# Patient Record
Sex: Female | Born: 1960
Health system: Southern US, Community
[De-identification: ages and names within clinical notes are randomized; demographics above are authoritative.]

## PROBLEM LIST (undated history)

## (undated) DIAGNOSIS — T7840XA Allergy, unspecified, initial encounter: Secondary | ICD-10-CM

## (undated) DIAGNOSIS — R112 Nausea with vomiting, unspecified: Secondary | ICD-10-CM

## (undated) DIAGNOSIS — I10 Essential (primary) hypertension: Secondary | ICD-10-CM

## (undated) DIAGNOSIS — Z9889 Other specified postprocedural states: Secondary | ICD-10-CM

## (undated) HISTORY — DX: Essential (primary) hypertension: I10

## (undated) HISTORY — DX: Allergy, unspecified, initial encounter: T78.40XA

## (undated) HISTORY — PX: ABDOMINAL HYSTERECTOMY: SHX81

## (undated) HISTORY — PX: OTHER SURGICAL HISTORY: SHX169

---

## 2000-10-11 ENCOUNTER — Ambulatory Visit (HOSPITAL_BASED_OUTPATIENT_CLINIC_OR_DEPARTMENT_OTHER): Admission: RE | Admit: 2000-10-11 | Discharge: 2000-10-11 | Payer: Self-pay | Admitting: *Deleted

## 2002-05-01 ENCOUNTER — Encounter: Payer: Self-pay | Admitting: Family Medicine

## 2002-05-01 ENCOUNTER — Ambulatory Visit (HOSPITAL_COMMUNITY): Admission: RE | Admit: 2002-05-01 | Discharge: 2002-05-01 | Payer: Self-pay | Admitting: Family Medicine

## 2002-06-11 ENCOUNTER — Inpatient Hospital Stay (HOSPITAL_COMMUNITY): Admission: RE | Admit: 2002-06-11 | Discharge: 2002-06-14 | Payer: Self-pay | Admitting: General Surgery

## 2003-08-25 ENCOUNTER — Ambulatory Visit (HOSPITAL_COMMUNITY): Admission: RE | Admit: 2003-08-25 | Discharge: 2003-08-25 | Payer: Self-pay | Admitting: Family Medicine

## 2004-11-02 ENCOUNTER — Ambulatory Visit: Payer: Self-pay | Admitting: Family Medicine

## 2004-11-03 ENCOUNTER — Ambulatory Visit (HOSPITAL_COMMUNITY): Admission: RE | Admit: 2004-11-03 | Discharge: 2004-11-03 | Payer: Self-pay | Admitting: Family Medicine

## 2005-05-11 ENCOUNTER — Ambulatory Visit: Payer: Self-pay | Admitting: Family Medicine

## 2005-12-09 ENCOUNTER — Ambulatory Visit: Payer: Self-pay | Admitting: Internal Medicine

## 2006-07-24 ENCOUNTER — Ambulatory Visit: Payer: Self-pay | Admitting: Family Medicine

## 2006-09-04 ENCOUNTER — Ambulatory Visit: Payer: Self-pay | Admitting: Family Medicine

## 2006-10-27 ENCOUNTER — Other Ambulatory Visit: Admission: RE | Admit: 2006-10-27 | Discharge: 2006-10-27 | Payer: Self-pay | Admitting: Family Medicine

## 2006-10-27 ENCOUNTER — Encounter: Payer: Self-pay | Admitting: Family Medicine

## 2006-10-27 ENCOUNTER — Ambulatory Visit: Payer: Self-pay | Admitting: Family Medicine

## 2007-07-02 ENCOUNTER — Encounter: Payer: Self-pay | Admitting: Family Medicine

## 2007-07-02 DIAGNOSIS — E669 Obesity, unspecified: Secondary | ICD-10-CM

## 2007-07-02 DIAGNOSIS — I10 Essential (primary) hypertension: Secondary | ICD-10-CM

## 2007-07-12 ENCOUNTER — Ambulatory Visit: Payer: Self-pay | Admitting: Family Medicine

## 2007-08-04 ENCOUNTER — Encounter: Payer: Self-pay | Admitting: Family Medicine

## 2007-08-04 LAB — CONVERTED CEMR LAB: Glucose, 2 hour: 224 mg/dL — ABNORMAL HIGH (ref 70–139)

## 2007-08-08 ENCOUNTER — Ambulatory Visit (HOSPITAL_COMMUNITY): Admission: RE | Admit: 2007-08-08 | Discharge: 2007-08-08 | Payer: Self-pay | Admitting: Family Medicine

## 2007-08-09 ENCOUNTER — Ambulatory Visit: Payer: Self-pay | Admitting: Family Medicine

## 2007-08-30 ENCOUNTER — Ambulatory Visit: Payer: Self-pay | Admitting: Family Medicine

## 2007-09-01 ENCOUNTER — Encounter: Payer: Self-pay | Admitting: Family Medicine

## 2007-09-01 LAB — CONVERTED CEMR LAB: Microalb, Ur: 0.2 mg/dL (ref 0.00–1.89)

## 2007-09-07 DIAGNOSIS — E1169 Type 2 diabetes mellitus with other specified complication: Secondary | ICD-10-CM | POA: Insufficient documentation

## 2007-09-07 DIAGNOSIS — E1159 Type 2 diabetes mellitus with other circulatory complications: Secondary | ICD-10-CM | POA: Insufficient documentation

## 2008-10-14 ENCOUNTER — Telehealth: Payer: Self-pay | Admitting: Family Medicine

## 2008-10-28 ENCOUNTER — Encounter: Payer: Self-pay | Admitting: Family Medicine

## 2008-10-28 ENCOUNTER — Other Ambulatory Visit: Admission: RE | Admit: 2008-10-28 | Discharge: 2008-10-28 | Payer: Self-pay | Admitting: Family Medicine

## 2008-10-28 ENCOUNTER — Ambulatory Visit: Payer: Self-pay | Admitting: Family Medicine

## 2008-10-28 DIAGNOSIS — R5381 Other malaise: Secondary | ICD-10-CM

## 2008-10-28 DIAGNOSIS — N760 Acute vaginitis: Secondary | ICD-10-CM | POA: Insufficient documentation

## 2008-10-28 DIAGNOSIS — H409 Unspecified glaucoma: Secondary | ICD-10-CM

## 2008-10-28 DIAGNOSIS — R5383 Other fatigue: Secondary | ICD-10-CM

## 2008-10-28 LAB — CONVERTED CEMR LAB: OCCULT 1: NEGATIVE

## 2008-10-29 ENCOUNTER — Encounter: Payer: Self-pay | Admitting: Family Medicine

## 2009-02-24 ENCOUNTER — Ambulatory Visit: Payer: Self-pay | Admitting: Family Medicine

## 2009-02-25 ENCOUNTER — Telehealth: Payer: Self-pay | Admitting: Family Medicine

## 2009-03-14 DIAGNOSIS — J309 Allergic rhinitis, unspecified: Secondary | ICD-10-CM

## 2009-04-13 ENCOUNTER — Encounter: Payer: Self-pay | Admitting: Family Medicine

## 2009-08-10 ENCOUNTER — Encounter: Payer: Self-pay | Admitting: Physician Assistant

## 2009-08-10 ENCOUNTER — Ambulatory Visit: Payer: Self-pay | Admitting: Family Medicine

## 2009-08-10 DIAGNOSIS — K5289 Other specified noninfective gastroenteritis and colitis: Secondary | ICD-10-CM

## 2009-08-11 ENCOUNTER — Encounter: Payer: Self-pay | Admitting: Physician Assistant

## 2009-08-11 ENCOUNTER — Telehealth: Payer: Self-pay | Admitting: Physician Assistant

## 2009-08-18 ENCOUNTER — Telehealth: Payer: Self-pay | Admitting: Physician Assistant

## 2009-08-19 ENCOUNTER — Ambulatory Visit: Payer: Self-pay | Admitting: Family Medicine

## 2009-08-19 ENCOUNTER — Encounter: Payer: Self-pay | Admitting: Physician Assistant

## 2009-09-28 ENCOUNTER — Telehealth: Payer: Self-pay | Admitting: Family Medicine

## 2009-11-05 ENCOUNTER — Encounter: Payer: Self-pay | Admitting: Family Medicine

## 2010-02-10 ENCOUNTER — Ambulatory Visit: Payer: Self-pay | Admitting: Family Medicine

## 2010-02-10 DIAGNOSIS — J209 Acute bronchitis, unspecified: Secondary | ICD-10-CM

## 2010-02-10 DIAGNOSIS — J019 Acute sinusitis, unspecified: Secondary | ICD-10-CM | POA: Insufficient documentation

## 2010-02-11 LAB — CONVERTED CEMR LAB
BUN: 8 mg/dL (ref 6–23)
Chloride: 102 meq/L (ref 96–112)
Glucose, Bld: 90 mg/dL (ref 70–99)
Potassium: 3.9 meq/L (ref 3.5–5.3)

## 2010-02-15 ENCOUNTER — Encounter (INDEPENDENT_AMBULATORY_CARE_PROVIDER_SITE_OTHER): Payer: Self-pay

## 2010-03-05 ENCOUNTER — Ambulatory Visit (HOSPITAL_COMMUNITY): Admission: RE | Admit: 2010-03-05 | Discharge: 2010-03-05 | Payer: Self-pay | Admitting: Family Medicine

## 2010-03-06 ENCOUNTER — Emergency Department (HOSPITAL_COMMUNITY): Admission: EM | Admit: 2010-03-06 | Discharge: 2010-03-06 | Payer: Self-pay | Admitting: Emergency Medicine

## 2010-03-06 ENCOUNTER — Encounter: Payer: Self-pay | Admitting: Orthopedic Surgery

## 2010-03-08 ENCOUNTER — Encounter: Payer: Self-pay | Admitting: Physician Assistant

## 2010-03-08 ENCOUNTER — Ambulatory Visit: Payer: Self-pay | Admitting: Family Medicine

## 2010-03-08 DIAGNOSIS — IMO0002 Reserved for concepts with insufficient information to code with codable children: Secondary | ICD-10-CM

## 2010-03-09 ENCOUNTER — Telehealth: Payer: Self-pay | Admitting: Physician Assistant

## 2010-03-15 ENCOUNTER — Encounter: Payer: Self-pay | Admitting: Physician Assistant

## 2010-03-15 ENCOUNTER — Ambulatory Visit: Payer: Self-pay | Admitting: Family Medicine

## 2010-03-22 ENCOUNTER — Ambulatory Visit: Payer: Self-pay | Admitting: Orthopedic Surgery

## 2010-03-22 DIAGNOSIS — M542 Cervicalgia: Secondary | ICD-10-CM

## 2010-03-22 DIAGNOSIS — M549 Dorsalgia, unspecified: Secondary | ICD-10-CM | POA: Insufficient documentation

## 2010-03-29 ENCOUNTER — Encounter: Payer: Self-pay | Admitting: Orthopedic Surgery

## 2010-03-30 ENCOUNTER — Encounter (HOSPITAL_COMMUNITY): Admission: RE | Admit: 2010-03-30 | Discharge: 2010-04-29 | Payer: Self-pay | Admitting: Orthopedic Surgery

## 2010-04-02 ENCOUNTER — Encounter: Payer: Self-pay | Admitting: Orthopedic Surgery

## 2010-04-09 ENCOUNTER — Encounter: Payer: Self-pay | Admitting: Orthopedic Surgery

## 2010-04-13 ENCOUNTER — Encounter: Payer: Self-pay | Admitting: Orthopedic Surgery

## 2010-04-14 ENCOUNTER — Encounter: Payer: Self-pay | Admitting: Orthopedic Surgery

## 2010-05-03 ENCOUNTER — Encounter (HOSPITAL_COMMUNITY)
Admission: RE | Admit: 2010-05-03 | Discharge: 2010-06-02 | Payer: Self-pay | Source: Home / Self Care | Attending: Orthopedic Surgery | Admitting: Orthopedic Surgery

## 2010-05-05 ENCOUNTER — Encounter: Payer: Self-pay | Admitting: Orthopedic Surgery

## 2010-05-20 ENCOUNTER — Encounter: Payer: Self-pay | Admitting: Family Medicine

## 2010-06-02 ENCOUNTER — Ambulatory Visit: Payer: Self-pay | Admitting: Family Medicine

## 2010-06-26 ENCOUNTER — Encounter: Payer: Self-pay | Admitting: Family Medicine

## 2010-06-27 ENCOUNTER — Encounter: Payer: Self-pay | Admitting: Family Medicine

## 2010-06-28 ENCOUNTER — Encounter: Payer: Self-pay | Admitting: Family Medicine

## 2010-07-04 LAB — CONVERTED CEMR LAB: Candida species: NEGATIVE
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
## 2010-07-06 NOTE — Letter (Signed)
Summary: Historyform  Historyform   Imported By: Jacklynn Ganong 03/29/2010 13:08:04  _____________________________________________________________________  External Attachment:    Type:   Image     Comment:   External Document

## 2010-07-06 NOTE — Letter (Signed)
Summary: MED REVIEW SHEET  MED REVIEW SHEET   Imported By: Lind Guest 03/15/2010 15:38:15  _____________________________________________________________________  External Attachment:    Type:   Image     Comment:   External Document

## 2010-07-06 NOTE — Letter (Signed)
Summary: Out of Work  [facility]  [address]   [place], [place] [address]   Phone: [phone]  Fax: [phone]    August 19, 2009   Employee:  ILIZA BLANKENBECKLER    To Whom It May Concern:   For Medical reasons, please excuse the above named employee from work for the following dates:  Start:   08/19/09  End:     08/20/09 May return to work without restriction.    If you need additional information, please feel free to contact our office.         Sincerely,    Esperanza Sheets PA
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
## 2010-07-06 NOTE — Progress Notes (Signed)
Summary: FMLA PAPERS  Phone Note Call from Patient   Summary of Call: CAME BY TO SEE IF THE  FMLA PAPERS WERE READY AND WILL Braya Habermehl FILL THEM OUT  OR WHAT BUT SHE NEEDS THEM BY FRIDAY WILL COME BACK BY TOMORROW AND SEE IF THEY ARE READY  Initial call taken by: Lind Guest,  August 18, 2009 4:00 PM  Follow-up for Phone Call        Appt Scheduled Today Follow-up by: Esperanza Sheets PA,  August 19, 2009 9:59 AM

## 2010-07-06 NOTE — Letter (Signed)
Summary: fmla papers  fmla papers   Imported By: Lind Guest 08/20/2009 07:59:09  _____________________________________________________________________  External Attachment:    Type:   Image     Comment:   External Document

## 2010-07-06 NOTE — Letter (Signed)
Summary: health insurance claim  health insurance claim   Imported By: Lind Guest 08/20/2009 07:58:45  _____________________________________________________________________  External Attachment:    Type:   Image     Comment:   External Document

## 2010-07-06 NOTE — Miscellaneous (Signed)
Summary: PT Clinical evaluation  PT Clinical evaluation   Imported By: Jacklynn Ganong 04/13/2010 15:34:38  _____________________________________________________________________  External Attachment:    Type:   Image     Comment:   External Document

## 2010-07-06 NOTE — Progress Notes (Signed)
Summary: MEDICINE  Phone Note Call from Patient   Summary of Call: NEEDS HER DIABETIC MEDICINE REFILLED SEND TO [facility] [place] Initial call taken by: Lind Guest,  September 28, 2009 9:08 AM  Follow-up for Phone Call        Rx Called In Follow-up by: Adella Hare LPN,  September 28, 2009 12:10 PM    Prescriptions: METFORMIN HCL 1000 MG TABS (METFORMIN HCL) Take 1 tablet by mouth two times a day  #180 x 1   Entered by:   Adella Hare LPN   Authorized by:   Syliva Overman MD   Signed by:   Adella Hare LPN on 04/54/0981   Method used:   Electronically to        [facility]  [address] (retail)       [address]       [place], [place]  [address]       Ph: [phone]       Fax: [phone]   RxID:   [id number]

## 2010-07-06 NOTE — Letter (Signed)
Summary: Out of Work  [facility]  [address]  [place], [place] [address]   Phone: [phone]  Fax: [phone]      April 14, 2010   Employee:  Daisy Robbins    To Whom It May Concern:  Patient was seen in this office on 03/22/10.  For Medical reasons, please excuse the above named employee from work for the following dates:  Start:   03/22/10  End/Return to work, full duty, no restrictions:     04/20/10   If you need additional information, please feel free to contact our office.         Sincerely,    Terrance Mass, MD

## 2010-07-06 NOTE — Progress Notes (Signed)
Summary: EYE EXAM  EYE EXAM   Imported By: Lind Guest 11/20/2009 08:34:58  _____________________________________________________________________  External Attachment:    Type:   Image     Comment:   External Document

## 2010-07-06 NOTE — Assessment & Plan Note (Signed)
Summary: sick- room 3   Vital Signs:  Patient profile:   50 year old female Menstrual status:  hysterectomy Height:      63 inches Weight:      184.25 pounds BMI:     32.76 O2 Sat:      99 % on Room air Pulse rate:   93 / minute Resp:     16 per minute BP sitting:   126 / 70  (left arm)  Vitals Entered By: Adella Hare LPN (August 19, 2009 1:11 PM)  Nutrition Counseling: Patient's BMI is greater than 25 and therefore counseled on weight management options. CC: nausea, vomitting, diarrhea x 1 day Is Patient Diabetic? Yes Pain Assessment Patient in pain? no        CC:  nausea, vomitting, and diarrhea x 1 day.  History of Present Illness: Nausea started last night.  Awoke 2 am today wiht diarrhea & vomiting.  No vomiting since early this am, but still having diarrhea.  No blood. + chills, but no fever.  Pt states this is going thru her family.  It started with her grandson, then her son.  Now her 2 daughters are starting to get nauseated.  She was seen 08-10-09 with similar syptoms but had completely resolved & had returned to work this wk.  Unable to work today.  Pt is diabetic.  Not checking her blood sugars.  Is overdue for labs.  Has an appt in April with Dr Lodema Hong for follow up.  Pt c/o vaginal itching the last couple of day.  Also has small amt of white vag disch.  Feels like she has a yeast infection again.  Requesting prescription.   Current Medications (verified): 1)  Benazepril-Hydrochlorothiazide 20-12.5 Mg  Tabs (Benazepril-Hydrochlorothiazide) .... Take One Tablet By Mouth Once A Day 2)  Allegra 180 Mg  Tabs (Fexofenadine Hcl) .... Take One Tablet By Mouth Once A Day 3)  Metformin Hcl 1000 Mg Tabs (Metformin Hcl) .... Take 1 Tablet By Mouth Two Times A Day 4)  Tessalon Perles 100 Mg Caps (Benzonatate) .... Take 1 Capsule By Mouth Three Times A Day As Needed 5)  Promethazine Hcl 25 Mg Tabs (Promethazine Hcl) .Marland Kitchen.. 1 Q 6-8 H As Needed Nausea 6)  Loperamide Hcl 2 Mg  Caps (Loperamide Hcl) .... Take 2 Caps Now, Then 1 After Each Loose Bm.  Max 8 Caps/24 Hrs.  Allergies (verified): No Known Drug Allergies  Past History:  Past medical history reviewed for relevance to current acute and chronic problems.  Past Medical History: Reviewed history from 10/28/2008 and no changes required. DIABETES MELLITUS, TYPE II, WITHOUT COMPLICATIONS (ICD-250.00) ACUTE BRONCHITIS (ICD-466.0) OBESITY, UNSPECIFIED (ICD-278.00) ESSENTIAL HYPERTENSION, BENIGN (ICD-401.1) CONJUNCTIVITIS, RIGHT (ICD-372.30) SINUSITIS, ACUTE (ICD-461.9) Glaucoma dx in 2008  Review of Systems General:  Complains of chills; denies fever. ENT:  Denies earache, nasal congestion, sinus pressure, and sore throat. CV:  Denies chest pain or discomfort. Resp:  Denies cough and shortness of breath. GI:  Complains of diarrhea, loss of appetite, nausea, and vomiting; denies abdominal pain, bloody stools, constipation, and vomiting blood. MS:  Denies muscle aches.  Physical Exam  General:  Well-developed,well-nourished,in no acute distress; alert,appropriate and cooperative throughout examination Head:  Normocephalic and atraumatic without obvious abnormalities. No apparent alopecia or balding. Ears:  External ear exam shows no significant lesions or deformities.  Otoscopic examination reveals clear canals, tympanic membranes are intact bilaterally without bulging, retraction, inflammation or discharge. Hearing is grossly normal bilaterally. Nose:  External nasal examination  shows no deformity or inflammation. Nasal mucosa are pink and moist without lesions or exudates. Mouth:  Oral mucosa and oropharynx without lesions or exudates.  Neck:  No deformities, masses, or tenderness noted. Lungs:  Normal respiratory effort, chest expands symmetrically. Lungs are clear to auscultation, no crackles or wheezes. Heart:  Normal rate and regular rhythm. S1 and S2 normal without gallop, murmur, click, rub or  other extra sounds. Abdomen:  Bowel sounds positive,abdomen soft and non-tender without masses, organomegaly or hernias noted. Skin:  turgor normal.   Cervical Nodes:  No lymphadenopathy noted Psych:  Cognition and judgment appear intact. Alert and cooperative with normal attention span and concentration. No apparent delusions, illusions, hallucinations   Impression & Recommendations:  Problem # 1:  GASTROENTERITIS (ICD-558.9) Pt states she will use OTC Imodium as needed.  Did not get prev prescription for Loperamide. Discussed clear liquids, & BRAT diet as tolerated. Off work today.  Pt wants to return tomorrow if able. Her updated medication list for this problem includes:    Loperamide Hcl 2 Mg Caps (Loperamide hcl) .Marland Kitchen... Take 2 caps now, then 1 after each loose bm.  max 8 caps/24 hrs.  Orders: Zofran 1mg . injection (Z6109)  Problem # 2:  ESSENTIAL HYPERTENSION, BENIGN (ICD-401.1) Assessment: Improved  Her updated medication list for this problem includes:    Benazepril-hydrochlorothiazide 20-12.5 Mg Tabs (Benazepril-hydrochlorothiazide) .Marland Kitchen... Take one tablet by mouth once a day  BP today: 126/70 Prior BP: 140/80 (08/10/2009)  Problem # 3:  DIABETES MELLITUS, TYPE II, WITHOUT COMPLICATIONS (ICD-250.00) Encouraged pt to check her blood sugars.  Her updated medication list for this problem includes:    Benazepril-hydrochlorothiazide 20-12.5 Mg Tabs (Benazepril-hydrochlorothiazide) .Marland Kitchen... Take one tablet by mouth once a day    Metformin Hcl 1000 Mg Tabs (Metformin hcl) .Marland Kitchen... Take 1 tablet by mouth two times a day  Orders: T-Basic Metabolic Panel [phone]) T-Lipid Profile [phone]) T- Hemoglobin A1C ([id number]) T-Urine Microalbumin w/creat. ratio [id number])  Problem # 4:  VAGINITIS (ICD-616.10) Assessment: New Rxd Diflucan. If syptoms don't improve will need exam & cultures.  Complete Medication List: 1)  Benazepril-hydrochlorothiazide 20-12.5 Mg Tabs  (Benazepril-hydrochlorothiazide) .... Take one tablet by mouth once a day 2)  Allegra 180 Mg Tabs (Fexofenadine hcl) .... Take one tablet by mouth once a day 3)  Metformin Hcl 1000 Mg Tabs (Metformin hcl) .... Take 1 tablet by mouth two times a day 4)  Tessalon Perles 100 Mg Caps (Benzonatate) .... Take 1 capsule by mouth three times a day as needed 5)  Promethazine Hcl 25 Mg Tabs (Promethazine hcl) .Marland Kitchen.. 1 q 6-8 h as needed nausea 6)  Loperamide Hcl 2 Mg Caps (Loperamide hcl) .... Take 2 caps now, then 1 after each loose bm.  max 8 caps/24 hrs. 7)  Fluconazole 150 Mg Tabs (Fluconazole) .... Take 1 by mouth for yeast infection  Patient Instructions: 1)  Keep your appt next month. 2)  Have lab work done fasting before your next appt. 3)  It is important that you exercise regularly at least 20 minutes 5 times a week. If you develop chest pain, have severe difficulty breathing, or feel very tired , stop exercising immediately and seek medical attention. 4)  You need to lose weight. Consider a lower calorie diet and regular exercise.  5)  Check your blood sugars regularly.  6)  The main problem with gastroenteritis is dehydration. Drink plenty of fluids and take solids as you feel better. If you are unable to keep anything down  and/or you show signs of dehydration(dry/cracked lips, lack of tears, not urinating, very sleepy), call our office.   7)  You may use over the counter Imodium as needed for diarrhea. 8)  You received an injection of Zofran today to help with nausea. 9)  . Prescriptions: FLUCONAZOLE 150 MG TABS (FLUCONAZOLE) take 1 by mouth for yeast infection  #1 x 0   Entered and Authorized by:   Esperanza Sheets PA   Signed by:   Esperanza Sheets PA on 08/19/2009   Method used:   Electronically to        [facility] (retail)       [address]       [place], [place]  [address]       Ph: [phone]       Fax: [phone]   RxID:    [id number]   Appended Document: sick- room 3   Medication Administration  Injection # 1:    Medication: Zofran 1mg . injection    Diagnosis: GASTROENTERITIS (ICD-558.9)    Route: IM    Site: RUOQ gluteus    Exp Date: 2/12    Lot #: [id number]    Mfr: [email or web address]    Comments: zofran 4mg     Patient tolerated injection without complications    Given by: Adella Hare LPN (August 19, 2009 4:06 PM)  Orders Added: 1)  Admin of Therapeutic Inj  intramuscular or subcutaneous [40102]

## 2010-07-06 NOTE — Letter (Signed)
Summary: *Orthopedic Consult Note  [facility]  [address]  [place], [place] [address]   Phone: [phone]  Fax: [phone]    Re:    Daisy Robbins DOB:    [birth year]   Dear: Alvis Lemmings   Thank you for requesting that we see the above patient for consultation.  A copy of the detailed office note will be sent under separate cover, for your review.  Evaluation today is consistent with:  1)  BACK PAIN (ICD-724.5) 2)  NECK PAIN, ACUTE (ICD-723.1)   Other than some mild DJD of the L spine there are no acute findings. There are no surgical lesions. She has not had physical therapy.   I recommend she have PT for 6 weeks and continue the medications you have her on. I have advised her to follw with you. If her condiditions worsen then we would be happy to see her again. She is scheduled to return to work On Nov 15th    Thank you for this opportunity to look after your patient.  Sincerely,   Terrance Mass. MD.

## 2010-07-06 NOTE — Letter (Signed)
Summary: Unable to Reach, Consult Scheduled  [facility]  [address]   [place], [place] [address]   Phone: [phone]  Fax: [phone]    02/15/2010  Daisy Robbins [address] [place], [place]  [address] [birth year]   Dear Ms. Lobban,   We have been unable to reach you by phone. You have been referred by Dr. Lodema Hong to have a colonoscopy. Please call our office and ask to speak to the triage nurse to get the appointment scheduled. If you have any questions we will be glad to try to answer them for you.     Thank you,    Cloria Spring LPN  [facility] R. Roetta Sessions, M.D.    Jonette Eva, M.D. Lorenza Burton, FNP-BC    Tana Coast, PA-C Phone: [phone]    Fax: [phone]

## 2010-07-06 NOTE — Letter (Signed)
Summary: Out of Work  [facility]  [address]   [place], [place] [address]   Phone: [phone]  Fax: [phone]    August 11, 2009   Employee:  Daisy Robbins    To Whom It May Concern:   For Medical reasons, please excuse the above named employee from work for the following dates:  Start:   08/10/09  End:   08/17/09 may return to work without restriction  If you need additional information, please feel free to contact our office.         Sincerely,    Esperanza Sheets PA

## 2010-07-06 NOTE — Letter (Signed)
Summary: Work note fax to employer  Work note fax to employer   Imported By: Cammie Sickle 04/20/2010 15:40:02  _____________________________________________________________________  External Attachment:    Type:   Image     Comment:   External Document

## 2010-07-06 NOTE — Letter (Signed)
Summary: [employer]  [employer]   Imported By: Cammie Sickle 04/05/2010 18:21:47  _____________________________________________________________________  External Attachment:    Type:   Image     Comment:   External Document
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
## 2010-07-06 NOTE — Progress Notes (Signed)
Summary: CALL HER BACK  Phone Note Call from Patient   Summary of Call: NEEDSS YOU TO CALL HER ABOUT A CHRIO. OR SOMETHING SHE LEFT MESSAGE Initial call taken by: Lind Guest,  March 09, 2010 4:31 PM  Follow-up for Phone Call        pt don't have appt with harrisons office until 03/22/2010 3:00. pt says there is no way she can go back to work on 03/15/2010. she has to push and pull and back and neck already hurt. would need a note to cover her until she sees dr. Diamantina Providence. She has paper work that job requires. please let her know what office can do. [phone] Follow-up by: Rudene Anda,  March 09, 2010 4:53 PM  Additional Follow-up for Phone Call Additional follow up Details #1::        Do you want to extend her work note or see her back first Additional Follow-up by: Everitt Amber LPN,  March 10, 2010 9:02 AM    Additional Follow-up for Phone Call Additional follow up Details #2::    We can extend it. Appt here on 03-15-10.   Follow-up by: Esperanza Sheets PA,  March 10, 2010 9:12 AM  Additional Follow-up for Phone Call Additional follow up Details #3:: Details for Additional Follow-up Action Taken: patient aware. Luann to schedule with dawn on 10/10 Additional Follow-up by: Everitt Amber LPN,  March 10, 2010 4:51 PM

## 2010-07-06 NOTE — Assessment & Plan Note (Signed)
Summary: sick - room 2   Vital Signs:  Patient profile:   50 year old female Menstrual status:  hysterectomy Height:      63 inches Weight:      183.75 pounds BMI:     32.67 O2 Sat:      98 % on Room air Pulse rate:   93 / minute Resp:     16 per minute BP sitting:   140 / 80  (left arm)  Vitals Entered By: Adella Hare LPN (August 10, 1608 3:49 PM) CC: nausea, diarrhea, chills, fever, body aches, fatigue x 2 days Is Patient Diabetic? Yes Did you bring your meter with you today? No Pain Assessment Patient in pain? no        CC:  nausea, diarrhea, chills, fever, body aches, and fatigue x 2 days.  History of Present Illness: Pt c/o nausea & diarrhea.  Started yesterday.  Watery diarrhea, no blood.  No vomiting.  Her appetite is decreased.  Drinking fluids ok.  Has tried to eat some food today, but had to throw it away due to nausea.  Tried to work today but feels exhausted.  No fever.  She is taking her medications.  Hx of DM & HTN.    Current Medications (verified): 1)  Benazepril-Hydrochlorothiazide 20-12.5 Mg  Tabs (Benazepril-Hydrochlorothiazide) .... Take One Tablet By Mouth Once A Day 2)  Allegra 180 Mg  Tabs (Fexofenadine Hcl) .... Take One Tablet By Mouth Once A Day 3)  Metformin Hcl 1000 Mg Tabs (Metformin Hcl) .... Take 1 Tablet By Mouth Two Times A Day 4)  Tessalon Perles 100 Mg Caps (Benzonatate) .... Take 1 Capsule By Mouth Three Times A Day As Needed  Allergies (verified): No Known Drug Allergies  Past History:  Past medical history reviewed for relevance to current acute and chronic problems.  Past Medical History: Reviewed history from 10/28/2008 and no changes required. DIABETES MELLITUS, TYPE II, WITHOUT COMPLICATIONS (ICD-250.00) ACUTE BRONCHITIS (ICD-466.0) OBESITY, UNSPECIFIED (ICD-278.00) ESSENTIAL HYPERTENSION, BENIGN (ICD-401.1) CONJUNCTIVITIS, RIGHT (ICD-372.30) SINUSITIS, ACUTE (ICD-461.9) Glaucoma dx in 2008  Review of  Systems General:  Denies chills and fever. ENT:  Denies earache, nasal congestion, and sore throat. CV:  Denies chest pain or discomfort and palpitations. Resp:  Denies cough and shortness of breath. GI:  Complains of diarrhea and nausea; denies abdominal pain, bloody stools, dark tarry stools, indigestion, and vomiting. GU:  Denies dysuria. MS:  Denies muscle aches. Heme:  Denies enlarge lymph nodes.  Physical Exam  General:  Well-developed,well-nourished,in no acute distress; alert,appropriate and cooperative throughout examination Head:  Normocephalic and atraumatic without obvious abnormalities. No apparent alopecia or balding. Ears:  External ear exam shows no significant lesions or deformities.  Otoscopic examination reveals clear canals, tympanic membranes are intact bilaterally without bulging, retraction, inflammation or discharge. Hearing is grossly normal bilaterally. Nose:  External nasal examination shows no deformity or inflammation. Nasal mucosa are pink and moist without lesions or exudates. Mouth:  Oral mucosa and oropharynx without lesions or exudates.  Teeth in good repair. Mouth moist. Neck:  No deformities, masses, or tenderness noted. Lungs:  Normal respiratory effort, chest expands symmetrically. Lungs are clear to auscultation, no crackles or wheezes. Heart:  Normal rate and regular rhythm. S1 and S2 normal without gallop, murmur, click, rub or other extra sounds. Abdomen:  Nl BX x 4.  Soft.  No mass.  Mild diffuse TTP.  No guarding, rebound or rigidity Skin:  Warm & dry. Cervical Nodes:  No lymphadenopathy  noted Psych:  Cognition and judgment appear intact. Alert and cooperative with normal attention span and concentration. No apparent delusions, illusions, hallucinations   Impression & Recommendations:  Problem # 1:  GASTROENTERITIS (ICD-558.9)  Pt declined shot in office for nausea.  Requests prescription pills.  Her updated medication list for this problem  includes:    Loperamide Hcl 2 Mg Caps (Loperamide hcl) .Marland Kitchen... Take 2 caps now, then 1 after each loose bm.  max 8 caps/24 hrs.  Discussed use of medication and role of diet. Encouraged clear liquids and electrolyte replacement fluids. Instructed to call if any signs of worsening dehydration.   Problem # 2:  ESSENTIAL HYPERTENSION, BENIGN (ICD-401.1) Assessment: Comment Only  Her updated medication list for this problem includes:    Benazepril-hydrochlorothiazide 20-12.5 Mg Tabs (Benazepril-hydrochlorothiazide) .Marland Kitchen... Take one tablet by mouth once a day  BP today: 140/80 Prior BP: 120/80 (02/24/2009)  Problem # 3:  DIABETES MELLITUS, TYPE II, WITHOUT COMPLICATIONS (ICD-250.00) Assessment: Comment Only  Her updated medication list for this problem includes:    Benazepril-hydrochlorothiazide 20-12.5 Mg Tabs (Benazepril-hydrochlorothiazide) .Marland Kitchen... Take one tablet by mouth once a day    Metformin Hcl 1000 Mg Tabs (Metformin hcl) .Marland Kitchen... Take 1 tablet by mouth two times a day  Reviewed HgBA1c results: 6.8 (03/16/2009)  Complete Medication List: 1)  Benazepril-hydrochlorothiazide 20-12.5 Mg Tabs (Benazepril-hydrochlorothiazide) .... Take one tablet by mouth once a day 2)  Allegra 180 Mg Tabs (Fexofenadine hcl) .... Take one tablet by mouth once a day 3)  Metformin Hcl 1000 Mg Tabs (Metformin hcl) .... Take 1 tablet by mouth two times a day 4)  Tessalon Perles 100 Mg Caps (Benzonatate) .... Take 1 capsule by mouth three times a day as needed 5)  Promethazine Hcl 25 Mg Tabs (Promethazine hcl) .Marland Kitchen.. 1 q 6-8 h as needed nausea 6)  Loperamide Hcl 2 Mg Caps (Loperamide hcl) .... Take 2 caps now, then 1 after each loose bm.  max 8 caps/24 hrs.  Patient Instructions: 1)  Please schedule a follow-up appointment as needed. 2)  Take 650-1000mg  of Tylenol every 4-6 hours as needed for relief of pain or comfort of fever AVOID taking more than 4000mg   in a 24 hour period (can cause liver damage in higher  doses). 3)  Oral Rehydration Solution: drink 1/2 ounce every 15 minutes. If tolerated afert 1 hour, drink 1 ounce every 15 minutes. As you can tolerate, keep adding 1/2 ounce every 15 minutes, up to a total of 2-4 ounces. Contact the office if unable to tolerate oral solution, if you keep vomiting, or you continue to have signs of dehydration. Prescriptions: LOPERAMIDE HCL 2 MG CAPS (LOPERAMIDE HCL) take 2 caps now, then 1 after each loose BM.  Max 8 caps/24 hrs.  #24 x 0   Entered and Authorized by:   Esperanza Sheets PA   Signed by:   Esperanza Sheets PA on 08/10/2009   Method used:   Electronically to        [facility] (retail)       [address]       [place], [place]  [address]       Ph: [phone]       Fax: [phone]   RxID:   [id number] PROMETHAZINE HCL 25 MG TABS (PROMETHAZINE HCL) 1 q 6-8 h as needed nausea  #12 x 0   Entered and Authorized by:   Esperanza Sheets PA  Signed by:   Esperanza Sheets PA on 08/10/2009   Method used:   Electronically to        [facility] (retail)       [address]       [place], [place]  [address]       Ph: [phone]       Fax: [phone]   RxID:   [id number]

## 2010-07-06 NOTE — Letter (Signed)
Summary: Out of Work  [facility]  [address]   [place], [place] [address]   Phone: [phone]  Fax: [phone]    March 15, 2010   Employee:  JEANNIFER DRAKEFORD    To Whom It May Concern:   For Medical reasons, please excuse the above named employee from work for the following dates:  Start:   03-08-10  End:   03-23-10 May return to work without restrictions.  If you need additional information, please feel free to contact our office.         Sincerely,    Esperanza Sheets PA

## 2010-07-06 NOTE — Miscellaneous (Signed)
Summary: PT discharge summary  PT discharge summary   Imported By: Jacklynn Ganong 05/06/2010 08:47:04  _____________________________________________________________________  External Attachment:    Type:   Image     Comment:   External Document

## 2010-07-06 NOTE — Letter (Signed)
Summary: Out of Work  [facility]  [address]   [place], [place] [address]   Phone: [phone]  Fax: [phone]    August 10, 2009   Employee:  LAKIE MCLOUTH    To Whom It May Concern:   For Medical reasons, please excuse the above named employee from work for the following dates:  Start:   08/10/09  End:   08/12/09 without restriction  If you need additional information, please feel free to contact our office.         Sincerely,    Esperanza Sheets PA

## 2010-07-06 NOTE — Letter (Signed)
Summary: FMLA PAPERS  FMLA PAPERS   Imported By: Lind Guest 03/15/2010 16:42:01  _____________________________________________________________________  External Attachment:    Type:   Image     Comment:   External Document

## 2010-07-06 NOTE — Assessment & Plan Note (Signed)
Summary: office visit   Vital Signs:  Patient profile:   50 year old female Menstrual status:  hysterectomy Height:      63 inches Weight:      191.25 pounds BMI:     34.00 O2 Sat:      98 % Pulse rate:   68 / minute Pulse rhythm:   regular Resp:     16 per minute BP sitting:   140 / 80  (right arm)  Vitals Entered By: Everitt Amber LPN  Nutrition Counseling: Patient's BMI is greater than 25 and therefore counseled on weight management options. CC: Follow up chronic problems, has some allergies and congestion, all clear but sometimes she's wheezy    CC:  Follow up chronic problems, has some allergies and congestion, and all clear but sometimes she's wheezy .  History of Present Illness: 1 week h/o chest and head congestion with wheezing, no fever, yes chills and fatigue. Pt has not been testing her blood sugars, her last hBA1C was 1 year ago, she denies polyuria, polydypsia or blurred vision. She is not exercoising regularly.She has gained weight. She has been out of bP meds for approx 2 weeks reportedly. Generally she feels well. Her cancer screening tests are pst due and she agrees to have these scheduled.  Allergies (verified): No Known Drug Allergies  Review of Systems      See HPI General:  Complains of fatigue and sweats. Eyes:  Denies blurring and discharge. ENT:  Complains of nasal congestion, postnasal drainage, and sinus pressure; denies sore throat. CV:  Denies chest pain or discomfort, palpitations, and swelling of feet. Resp:  Complains of cough, shortness of breath, and wheezing. GI:  Denies abdominal pain, constipation, diarrhea, nausea, and vomiting. GU:  Denies dysuria and urinary frequency. MS:  Denies joint pain, low back pain, mid back pain, and stiffness. Derm:  Denies itching and rash. Neuro:  Complains of headaches; denies seizures, sensation of room spinning, and tingling; 1 week history with head congestrion. Psych:  Denies anxiety and  depression. Endo:  Denies cold intolerance, excessive thirst, excessive urination, and heat intolerance. Heme:  Denies abnormal bruising and bleeding. Allergy:  Complains of seasonal allergies.  Physical Exam  General:  Well-developed,obese,in no acute distress; alert,appropriate and cooperative throughout examination HEENT: No facial asymmetry,  EOMI, No sinus tenderness, TM's Clear, oropharynx  pink and moist. Nasal congestion and erythema  Chest: decreased air entry, bilateral crackles and wheezes CVS: S1, S2, No murmurs, No S3.   Abd: Soft, Nontender.  MS: Adequate ROM spine, hips, shoulders and knees.  Ext: No edema.   CNS: CN 2-12 intact, power tone and sensation normal throughout.   Skin: Intact, no visible lesions or rashes.  Psych: Good eye contact, normal affect.  Memory intact, not anxious or depressed appearing.    Impression & Recommendations:  Problem # 1:  SINUSITIS, ACUTE (ICD-461.9) Assessment Comment Only  The following medications were removed from the medication list:    Tessalon Perles 100 Mg Caps (Benzonatate) .Marland Kitchen... Take 1 capsule by mouth three times a day as needed Her updated medication list for this problem includes:    Penicillin V Potassium 500 Mg Tabs (Penicillin v potassium) .Marland Kitchen... Take 1 tablet by mouth three times a day    Tessalon Perles 100 Mg Caps (Benzonatate) .Marland Kitchen... Take 1 capsule by mouth three times a day  Problem # 2:  ACUTE BRONCHITIS (ICD-466.0) Assessment: Comment Only  The following medications were removed from the medication list:  Tessalon Perles 100 Mg Caps (Benzonatate) .Marland Kitchen... Take 1 capsule by mouth three times a day as needed Her updated medication list for this problem includes:    Penicillin V Potassium 500 Mg Tabs (Penicillin v potassium) .Marland Kitchen... Take 1 tablet by mouth three times a day    Tessalon Perles 100 Mg Caps (Benzonatate) .Marland Kitchen... Take 1 capsule by mouth three times a day  Orders: Albuterol Sulfate Sol 1mg  unit dose  (Z6109) Ipratropium inhalation sol. unit dose (U0454) Nebulizer Tx (09811) Depo- Medrol 80mg  (J1040) Rocephin  250mg  (B1478) Admin of Therapeutic Inj  intramuscular or subcutaneous (29562)  Problem # 3:  GLAUCOMA (ICD-365.9) Assessment: Comment Only followed regularly by opthalmology  Problem # 4:  DIABETES MELLITUS, TYPE II, WITHOUT COMPLICATIONS (ICD-250.00) Assessment: Comment Only  Her updated medication list for this problem includes:    Benazepril-hydrochlorothiazide 20-12.5 Mg Tabs (Benazepril-hydrochlorothiazide) .Marland Kitchen... Take one tablet by mouth once a day    Metformin Hcl 1000 Mg Tabs (Metformin hcl) .Marland Kitchen... Take 1 tablet by mouth two times a day  Orders: T- Hemoglobin A1C (13086-57846), past due, to obntain today T-Urine Microalbumin w/creat. ratio [phone])  Reviewed HgBA1c results: 6.8 (03/16/2009)  Problem # 5:  ESSENTIAL HYPERTENSION, BENIGN (ICD-401.1) Assessment: Deteriorated  Her updated medication list for this problem includes:    Benazepril-hydrochlorothiazide 20-12.5 Mg Tabs (Benazepril-hydrochlorothiazide) .Marland Kitchen... Take one tablet by mouth once a day  Orders: T-Basic Metabolic Panel [phone])  BP today: 140/80 Prior BP: 126/70 (08/19/2009)  Complete Medication List: 1)  Benazepril-hydrochlorothiazide 20-12.5 Mg Tabs (Benazepril-hydrochlorothiazide) .... Take one tablet by mouth once a day 2)  Allegra 180 Mg Tabs (Fexofenadine hcl) .... Take one tablet by mouth once a day 3)  Metformin Hcl 1000 Mg Tabs (Metformin hcl) .... Take 1 tablet by mouth two times a day 4)  Penicillin V Potassium 500 Mg Tabs (Penicillin v potassium) .... Take 1 tablet by mouth three times a day 5)  Tessalon Perles 100 Mg Caps (Benzonatate) .... Take 1 capsule by mouth three times a day 6)  Fluconazole 150 Mg Tabs (Fluconazole) .... Take 1 tablet by mouth once a day as needed for vaginal itching  Other Orders: Radiology Referral (Radiology) Gastroenterology Referral  (GI)  Patient Instructions: 1)  Please schedule a CPE in 3.5 months. 2)  you are being treated for acute bronchitis and sinusitis, you will get meds in the office a well as some are sent to the pharmacy 3)  We will schedule your mamogram and colonscopy, botrh  are past Due 4)  BMP prior to visit, ICD-9:   today 5)  HbgA1C prior to visit, ICD-9: 6)  Urine Microalbumin prior to visit, ICD-9:  send from office today pls Prescriptions: FLUCONAZOLE 150 MG TABS (FLUCONAZOLE) Take 1 tablet by mouth once a day as needed for vaginal itching  #3 x 0   Entered and Authorized by:   Syliva Overman MD   Signed by:   Syliva Overman MD on 02/10/2010   Method used:   Print then Give to Patient   RxID:   [id number] TESSALON PERLES 100 MG CAPS (BENZONATATE) Take 1 capsule by mouth three times a day  #30 x 0   Entered and Authorized by:   Syliva Overman MD   Signed by:   Syliva Overman MD on 02/10/2010   Method used:   Electronically to        [facility] (retail)       [address]  [place], [place]  [address]       Ph: [phone]       Fax: [phone]   RxID:   [id number] PENICILLIN V POTASSIUM 500 MG TABS (PENICILLIN V POTASSIUM) Take 1 tablet by mouth three times a day  #30 x 0   Entered and Authorized by:   Syliva Overman MD   Signed by:   Syliva Overman MD on 02/10/2010   Method used:   Electronically to        [facility] (retail)       [address]       [place], [place]  [address]       Ph: [phone]       Fax: [phone]   RxID:   [id number] BENAZEPRIL-HYDROCHLOROTHIAZIDE 20-12.5 MG  TABS (BENAZEPRIL-HYDROCHLOROTHIAZIDE) Take one tablet by mouth once a day  #90 x 1   Entered and Authorized by:   Syliva Overman MD   Signed by:   Syliva Overman MD on 02/10/2010   Method used:   Electronically to        [facility] (retail)       [address]       [place], [place]  [address]       Ph: [phone]       Fax: [phone]   RxID:   [id number] ALLEGRA 180 MG  TABS (FEXOFENADINE HCL) Take one tablet by mouth once a day  #180 x 1   Entered and Authorized by:   Syliva Overman MD   Signed by:   Syliva Overman MD on 02/10/2010   Method used:   Electronically to        [facility] (retail)       [address]       [place], [place]  [address]       Ph: [phone]       Fax: [phone]   RxID:   [id number] METFORMIN HCL 1000 MG TABS (METFORMIN HCL) Take 1 tablet by mouth two times a day  #180 x 1   Entered and Authorized by:   Syliva Overman MD   Signed by:   Syliva Overman MD on 02/10/2010   Method used:   Electronically to        [facility] (retail)       [address]       [place], [place]  [address]       Ph: [phone]       Fax: [phone]   RxID:   [id number]    Medication Administration  Injection # 1:    Medication: Depo- Medrol 80mg     Diagnosis: ACUTE BRONCHITIS (ICD-466.0)    Route: IM    Site: RUOQ gluteus    Exp Date: 6/12    Lot #: [id number]    Mfr: Pharmacia    Patient tolerated injection without complications    Given by: Adella Hare LPN (February 10, 2010 5:07 PM)  Injection # 2:    Medication: Rocephin  250mg     Diagnosis: ACUTE BRONCHITIS (ICD-466.0)    Route: IM    Site: LUOQ gluteus    Exp Date: 12/13    Lot #: [id number]  Mfr: novaplus    Comments: rocephin 500mg  given    Patient tolerated injection without complications    Given by: Adella Hare LPN (February 10, 2010 5:07 PM)  Medication # 1:    Medication: Albuterol Sulfate Sol 1mg  unit dose    Diagnosis: ACUTE BRONCHITIS (ICD-466.0)    Dose: 2.5mg     Route: inhaled    Exp Date: 8/12    Lot #: [id number]    Mfr: nephron pharm    Patient tolerated medication without complications    Given by: Adella Hare LPN (February 10, 2010 4:55  PM)  Medication # 2:    Medication: Ipratropium inhalation sol. unit dose    Diagnosis: ACUTE BRONCHITIS (ICD-466.0)    Dose: 0.5mg     Route: inhaled    Exp Date: 10/12    Lot #: [id number]    Mfr: nephron pharm    Patient tolerated medication without complications    Given by: Adella Hare LPN (February 10, 2010 4:56 PM)  Orders Added: 1)  Radiology Referral [Radiology] 2)  Gastroenterology Referral [GI] 3)  Est. Patient Level IV [32202] 4)  T-Basic Metabolic Panel [80048-22910] 5)  T- Hemoglobin A1C [83036-23375] 6)  T-Urine Microalbumin w/creat. ratio [82043-[address]] 7)  Albuterol Sulfate Sol 1mg  unit dose [J7613] 8)  Ipratropium inhalation sol. unit dose [J7644] 9)  Nebulizer Tx [94640] 10)  Depo- Medrol 80mg  [J1040] 11)  Rocephin  250mg  [J0696] 12)  Admin of Therapeutic Inj  intramuscular or subcutaneous [54270]

## 2010-07-06 NOTE — Progress Notes (Signed)
Summary: note  Phone Note Call from Patient   Summary of Call: pt feels no better. states feels worse today. can she get another work note? [phone] Initial call taken by: Rudene Anda,  August 11, 2009 3:04 PM  Follow-up for Phone Call        work note extended for the rest of this week.  If she 's not feeling better Monday she needs an appt for follow up. Follow-up by: Esperanza Sheets PA,  August 11, 2009 3:45 PM  Additional Follow-up for Phone Call Additional follow up Details #1::        patient aware Additional Follow-up by: Adella Hare LPN,  August 11, 2009 3:48 PM

## 2010-07-06 NOTE — Letter (Signed)
Summary: Fmla form  Fmla form   Imported By: Cammie Sickle 04/13/2010 17:50:57  _____________________________________________________________________  External Attachment:    Type:   Image     Comment:   External Document

## 2010-07-06 NOTE — Letter (Signed)
Summary: Out of Work  [facility]  [address]   [place], [place] [address]   Phone: [phone]  Fax: [phone]    March 08, 2010   Employee:  ARLICIA PAQUETTE    To Whom It May Concern:   For Medical reasons, please excuse the above named employee from work for the following dates:  Start:   03/08/10  End:   03/15/10 to return with no restrictions  If you need additional information, please feel free to contact our office.         Sincerely,    Esperanza Sheets, [place]

## 2010-07-08 NOTE — Letter (Signed)
Summary: 1st missed letter  1st missed letter   Imported By: Lind Guest 06/03/2010 10:08:39  _____________________________________________________________________  External Attachment:    Type:   Image     Comment:   External Document

## 2010-07-08 NOTE — Letter (Signed)
Summary: medical release  medical release   Imported By: Lind Guest 05/21/2010 08:07:11  _____________________________________________________________________  External Attachment:    Type:   Image     Comment:   External Document

## 2010-07-09 NOTE — Letter (Signed)
Summary: [facility] [employer] form  [employer]   Imported By: Cammie Sickle 04/13/2010 17:52:28  _____________________________________________________________________  External Attachment:    Type:   Image     Comment:   External Document

## 2010-09-09 ENCOUNTER — Ambulatory Visit (INDEPENDENT_AMBULATORY_CARE_PROVIDER_SITE_OTHER): Payer: PRIVATE HEALTH INSURANCE | Admitting: Family Medicine

## 2010-09-09 ENCOUNTER — Encounter: Payer: Self-pay | Admitting: Family Medicine

## 2010-09-09 ENCOUNTER — Telehealth: Payer: Self-pay | Admitting: Family Medicine

## 2010-09-09 VITALS — BP 130/84 | HR 73 | Resp 16 | Ht 63.0 in | Wt 188.0 lb

## 2010-09-09 DIAGNOSIS — J309 Allergic rhinitis, unspecified: Secondary | ICD-10-CM

## 2010-09-09 DIAGNOSIS — J310 Chronic rhinitis: Secondary | ICD-10-CM

## 2010-09-09 DIAGNOSIS — E669 Obesity, unspecified: Secondary | ICD-10-CM

## 2010-09-09 DIAGNOSIS — J302 Other seasonal allergic rhinitis: Secondary | ICD-10-CM

## 2010-09-09 DIAGNOSIS — E119 Type 2 diabetes mellitus without complications: Secondary | ICD-10-CM

## 2010-09-09 DIAGNOSIS — R062 Wheezing: Secondary | ICD-10-CM

## 2010-09-09 DIAGNOSIS — I10 Essential (primary) hypertension: Secondary | ICD-10-CM

## 2010-09-09 DIAGNOSIS — R51 Headache: Secondary | ICD-10-CM

## 2010-09-09 MED ORDER — ALBUTEROL SULFATE (2.5 MG/3ML) 0.083% IN NEBU
2.5000 mg | INHALATION_SOLUTION | RESPIRATORY_TRACT | Status: AC
  Administered 2010-09-09: 2.5 mg via RESPIRATORY_TRACT

## 2010-09-09 MED ORDER — FEXOFENADINE HCL 180 MG PO TABS: 180.0000 mg | ORAL_TABLET | Freq: Every day | ORAL | Status: DC

## 2010-09-09 MED ORDER — KETOROLAC TROMETHAMINE 30 MG/ML IJ SOLN
60.0000 mg | Freq: Once | INTRAMUSCULAR | Status: AC
  Administered 2010-09-09: 60 mg via INTRAMUSCULAR

## 2010-09-09 MED ORDER — PREDNISONE (PAK) 5 MG PO TABS: 5.0000 mg | ORAL_TABLET | ORAL | Status: DC

## 2010-09-09 MED ORDER — BENZONATATE 100 MG PO CAPS: 100.0000 mg | ORAL_CAPSULE | Freq: Three times a day (TID) | ORAL | Status: AC | PRN

## 2010-09-09 MED ORDER — IPRATROPIUM BROMIDE 0.02 % IN SOLN
0.5000 mg | RESPIRATORY_TRACT | Status: DC
  Administered 2010-09-09: 0.5 mg via RESPIRATORY_TRACT

## 2010-09-09 MED ORDER — METHYLPREDNISOLONE ACETATE 80 MG/ML IJ SUSP
80.0000 mg | Freq: Once | INTRAMUSCULAR | Status: AC
  Administered 2010-09-09: 80 mg via INTRAMUSCULAR

## 2010-09-09 MED ORDER — ALBUTEROL SULFATE HFA 108 (90 BASE) MCG/ACT IN AERS: 2.0000 | INHALATION_SPRAY | Freq: Four times a day (QID) | RESPIRATORY_TRACT | Status: DC | PRN

## 2010-09-09 MED ORDER — BENAZEPRIL-HYDROCHLOROTHIAZIDE 20-12.5 MG PO TABS: 1.0000 | ORAL_TABLET | Freq: Every day | ORAL | Status: DC

## 2010-09-09 NOTE — Telephone Encounter (Signed)
Work in this pm before 3pm please

## 2010-09-09 NOTE — Telephone Encounter (Signed)
Patient coming on in

## 2010-09-09 NOTE — Patient Instructions (Signed)
F/u in 4 months. You will get a breathing treatment as well as injections in the office for uncontrolled allergies as well as headache.  HBA1C and chem 7 today.  Meds are being sent to the pharmacy also.

## 2010-09-09 NOTE — Progress Notes (Deleted)
  Subjective:    Patient ID: Daisy Robbins, female    DOB: 08/11/1960, 50 y.o.   MRN: [id number]  Diabetes She presents for her follow-up diabetic visit. She has type 2 diabetes mellitus. No MedicAlert identification noted. Hypoglycemia symptoms include headaches. Pertinent negatives for hypoglycemia include no confusion, dizziness, nervousness/anxiousness, seizures, speech difficulty or tremors. Associated symptoms include fatigue. Pertinent negatives for diabetes include no chest pain and no weakness. Risk factors for coronary artery disease include obesity. Current diabetic treatment includes oral agent (monotherapy). She is compliant with treatment none of the time. Her weight is stable. She has not had a previous visit with a dietician. She participates in exercise intermittently. An ACE inhibitor/angiotensin II receptor blocker is not being taken. She does not see a podiatrist.  2 week h/o uncontrolled allergy symptoms, with head and chest congestion, no fever or chills, pt reports wheezing, nasal drainage is clear and when she does cough up sputum that is also clear. She reports headache esp frontal. Prior to this she reports doing well    Review of Systems  Constitutional: Positive for fatigue. Negative for fever, chills, activity change, appetite change and unexpected weight change.  HENT: Positive for congestion, rhinorrhea, sneezing and postnasal drip. Negative for hearing loss, ear pain, sore throat, trouble swallowing, neck pain, neck stiffness and sinus pressure.   Eyes: Negative for photophobia, pain, discharge, redness, itching and visual disturbance.  Respiratory: Positive for cough and wheezing. Negative for chest tightness and shortness of breath.   Cardiovascular: Negative for chest pain, palpitations and leg swelling.  Gastrointestinal: Negative for nausea, vomiting, abdominal pain, diarrhea, constipation and blood in stool.  Genitourinary: Negative for dysuria, frequency,  hematuria and flank pain.  Musculoskeletal: Negative for myalgias, back pain, joint swelling, arthralgias and gait problem.  Skin: Negative for rash and wound.  Neurological: Positive for headaches. Negative for dizziness, tremors, seizures, speech difficulty, weakness and numbness.  Hematological: Negative for adenopathy. Does not bruise/bleed easily.  Psychiatric/Behavioral: Negative for suicidal ideas, hallucinations, behavioral problems, confusion, sleep disturbance and decreased concentration. The patient is not nervous/anxious and is not hyperactive.        Objective:   Physical Exam  HENT:       erythemaq and edema of nasal mucosa, and frontal sinus tenderness          Assessment & Plan:

## 2010-09-10 NOTE — Progress Notes (Signed)
Subjective:    Patient ID: Daisy Robbins, female    DOB: 1960/12/28, 50 y.o.   MRN: [id number]  Diabetes She presents for her follow-up diabetic visit. She has type 2 diabetes mellitus. No MedicAlert identification noted. Hypoglycemia symptoms include headaches. Pertinent negatives for hypoglycemia include no confusion, dizziness, nervousness/anxiousness, seizures, speech difficulty or tremors. Associated symptoms include fatigue. Pertinent negatives for diabetes include no chest pain and no weakness. Risk factors for coronary artery disease include dyslipidemia, obesity and hypertension. Current diabetic treatment includes oral agent (monotherapy). She is compliant with treatment some of the time. She has not had a previous visit with a dietician. She participates in exercise intermittently. An ACE inhibitor/angiotensin II receptor blocker is being taken. She does not see a podiatrist. Hypertension This is a chronic problem. The current episode started more than 1 year ago. The problem is unchanged. The problem is controlled. Associated symptoms include headaches. Pertinent negatives include no chest pain, neck pain, palpitations or shortness of breath. There are no associated agents to hypertension. Risk factors for coronary artery disease include diabetes mellitus and obesity. Past treatments include ACE inhibitors and diuretics. The current treatment provides moderate improvement.   2 week h/o uncontrolled allergy symptoms, increasing head congestion with frontal headache, clear nasal drainage , as well as nasal congestion and siuffiness. She has aslo had chest congestion with wheezing and tightness over the past 2 days and has been unable to work today. Prior to this she has been well. She is non compliant with metformin dose and does not test her sugars regularly. Review of Systems  Constitutional: Positive for fatigue. Negative for fever, chills, activity change, appetite change and unexpected  weight change.  HENT: Positive for congestion, rhinorrhea, sneezing, postnasal drip and sinus pressure. Negative for hearing loss, ear pain, sore throat, trouble swallowing, neck pain and neck stiffness.   Eyes: Positive for itching. Negative for photophobia, pain, discharge, redness and visual disturbance.  Respiratory: Positive for chest tightness and wheezing. Negative for cough and shortness of breath.   Cardiovascular: Negative for chest pain, palpitations and leg swelling.  Gastrointestinal: Negative for nausea, vomiting, abdominal pain, diarrhea, constipation and blood in stool.  Genitourinary: Negative for dysuria, frequency, hematuria and flank pain.  Musculoskeletal: Negative for myalgias, back pain, joint swelling, arthralgias and gait problem.  Skin: Negative for rash and wound.  Neurological: Positive for headaches. Negative for dizziness, tremors, seizures, speech difficulty, weakness and numbness.  Hematological: Negative for adenopathy. Does not bruise/bleed easily.  Psychiatric/Behavioral: Negative for suicidal ideas, hallucinations, behavioral problems, confusion, sleep disturbance and decreased concentration. The patient is not nervous/anxious and is not hyperactive.        Objective:   Physical Exam  Nursing note and vitals reviewed. Constitutional: She is oriented to person, place, and time. She appears well-developed and well-nourished.  HENT:  Head: Normocephalic.  Right Ear: External ear normal.  Left Ear: External ear normal.  Mouth/Throat: No oropharyngeal exudate.       Frontal and maxillary sinus tenderness, with erythema and edema of nasal mucosa  Eyes: Conjunctivae and EOM are normal. Right eye exhibits no discharge. Left eye exhibits no discharge. No scleral icterus.  Neck: Normal range of motion. Neck supple. No JVD present. No tracheal deviation present. No thyromegaly present.  Cardiovascular: Normal rate, regular rhythm, normal heart sounds and intact  distal pulses.   No murmur heard. Pulmonary/Chest: Effort normal. No stridor. No respiratory distress. She has wheezes. She has no rales. She exhibits no tenderness.  Abdominal:  Soft. Bowel sounds are normal. There is no tenderness. There is no rebound and no guarding.  Musculoskeletal: Normal range of motion. She exhibits no edema.  Lymphadenopathy:    She has no cervical adenopathy.  Neurological: She is alert and oriented to person, place, and time. No cranial nerve deficit. Coordination normal.  Skin: Skin is warm and dry. No rash noted. No erythema.  Psychiatric: She has a normal mood and affect. Her behavior is normal. Judgment and thought content normal.          Assessment & Plan:  1.seasonal allergies: exacerbation , depomedrol administered and prednisone dose pack prescribed. Daily allegra advised. Wheezing: duoneb treatment administered , and albuterol MDI prescribed 3.Diabetes: non compliant with treatment plan, labs overdue , status unknown, education re the impt of behavioral change done 4.Obesity, lifestyle change encouraged to promote weight loss and improve health 5.Hypertension: controlled , no change in meds

## 2010-09-16 NOTE — Telephone Encounter (Signed)
Patient has appt and will be coming in per [place]

## 2010-10-22 NOTE — Discharge Summary (Signed)
   NAME:  Daisy Robbins, Daisy Robbins                         ACCOUNT NO.:  [id number]   MEDICAL RECORD NO.:  [id number]                   PATIENT TYPE:  INP   LOCATION:  A305                                 FACILITY:  [facility]   PHYSICIAN:  Dirk Dress. Katrinka Blazing, M.D.                DATE OF BIRTH:  [birth year]   DATE OF ADMISSION:  06/11/2002  DATE OF DISCHARGE:  06/14/2002                                 DISCHARGE SUMMARY   DISCHARGE DIAGNOSES:  1. Dysfunctional uterine bleeding, uterine fibroids.  2. Hypertension.   SPECIAL PROCEDURE:  Total abdominal hysterectomy on January 6.   DISPOSITION:  The patient discharged home in stable satisfactory condition.   DISCHARGE MEDICATIONS:  1. Ferrous sulfate 325 mg b.i.d.  2. Tylox 1 or 2 every four hours as needed for pain.  3. Diovan/HCT 160 mg/12.5 mg daily.   FOLLOW UP:  She is advised to be seen in the office two weeks post  discharge.   SUMMARY:  A 50 year old female with severe dysfunctional uterine bleeding,  severe menorrhagia, and chronic diarrhea.  She has been anemic for at least  five years.  She was having severe bleeding and pelvic pain dating back to  1997.  It got much worse over the past year.  She had menses lasting up to  10 days or longer.  The specifics of her bleeding are given in the admission  note.  She had many days when she required narcotic analgesics and had to  stay off work.  Hemoglobin ranged from 8.5 to 9.  Iron saturation was 9%.  She has a large uterus with submucosal fibroids.  She was treated with  hormonal manipulation without improvement.  It was felt that at this time  she needed to have hysterectomy.  The patient was admitted through day  surgery and on January 6 underwent total abdominal hysterectomy without  difficulty.  Pathology revealed intramural and subserosal leiomyomata with  necrosis and hemorrhage.  The patient had an uneventful postoperative  course.  She had good return of intestinal function.  She  remained stable  and was discharged home on the morning of the third hospital day in  satisfactory condition.                                               Dirk Dress. Katrinka Blazing, M.D.    LCS/MEDQ  D:  07/22/2002  T:  07/22/2002  Job:  [id number]

## 2010-10-22 NOTE — Op Note (Signed)
[place]  Patient:    Daisy Robbins, Daisy Robbins                        MRN: [id number] Adm. Date:  [id number] Attending:  Kendell Bane                           Operative Report  PREOPERATIVE DIAGNOSIS:  Right carpal tunnel syndrome.  POSTOPERATIVE DIAGNOSIS:  Right carpal tunnel syndrome.  PROCEDURE:  Decompression median nerve, right carpal tunnel.  SURGEON:  Lowell Bouton, M.D.  ANESTHESIA:  Marcaine 0.5% local with sedation.  OPERATIVE FINDINGS:  The patient had significant compression of the median nerve beneath the transverse carpal ligament.  The motor branch was intact, and there were no masses in the carpal canal.  DESCRIPTION OF PROCEDURE:  Under 0.5% Marcaine local anesthesia with a tourniquet on the right arm, the right hand was prepped and draped in the usual fashion, and after exsanguinating the limb, the tourniquet was inflated to 250 mmHg.  A 3 cm longitudinal incision was made in the palm just ulnar to the thenar crease.  Sharp dissection was carried through the subcutaneous tissues, and bleeding points were coagulated.  Blunt dissection was carried through the superficial palmar fascia distal to the transverse carpal ligament, down to the median nerve.  A hemostat was placed in the carpal canal up against the hook of the hamate, and the transverse carpal ligament was divided on the ulnar border of the median nerve.  The proximal end of the ligament was divided with the scissors after dissecting the nerve away from the undersurface of the ligament.  The carpal canal was then palpated and was found to be adequately decompressed.  The nerve was examined, and the motor branch was identified.  The wound was irrigated with saline, and the skin was closed with 4-0 nylon suture.  Sterile dressings were applied, followed by a volar wrist splint.  The patient tolerated the procedure well and went to the recovery room  awake and stable, in good condition. DD:  10/11/00 TD:  10/12/00 Job: [id number]

## 2010-10-22 NOTE — Op Note (Signed)
   NAME:  Daisy Robbins, Daisy Robbins                         ACCOUNT NO.:  [id number]   MEDICAL RECORD NO.:  [id number]                   PATIENT TYPE:  AMB   LOCATION:  DAY                                  FACILITY:  [facility]   PHYSICIAN:  Jerolyn Shin C. Katrinka Blazing, M.D.                DATE OF BIRTH:  19-Dec-1960   DATE OF PROCEDURE:  06/11/2002  DATE OF DISCHARGE:                                 OPERATIVE REPORT   PREOPERATIVE DIAGNOSIS:  Dysfunctional uterine bleeding with uterine  fibroids.   POSTOPERATIVE DIAGNOSES:  1. Dysfunctional uterine bleeding.  2. Uterine fibroids.   PROCEDURE:  Total abdominal hysterectomy.   SURGEON:  Dirk Dress. Katrinka Blazing, M.D.   DESCRIPTION OF PROCEDURE:  Under general anesthesia, the patient's abdomen  was prepped and draped in a sterile field.  A lower midline incision was  made.  Exploration revealed an enlarged uterus with a large fibroid off the  fundus of the uterus.  The right colon and transverse colon were normal.  The small bowel mesentery appeared to be normal.  The gallbladder, liver,  pancreas, stomach, and spleen were normal to palpation.  The left colon  appeared to be normal to palpation.  The abdomen was packed off.  Round  ligaments were isolated, doubly clamped, divided, and tied with ligatures of  0 Dexon.  The anterior bladder flap was developed.  The junction between the  uterus and tubo-ovarian complex was doubly clamped, divided, and cut with  ligatures of 0 Dexon bilaterally.  The uterine vessels were clamped with  straight Heaney clamps and a Kocher clamp.  They were incised and controlled  with ligatures of 0 Dexon.  This was continued bilaterally and down to the  apex of the vagina.  Using the curved Heaney clamp, the apex of the vagina  was clamped, divided, and tied serially until it was closed.  The cuff was  then oversewn with running 0 Biosyn.  Hemostasis was achieved.  There was no  evidence of bleeding.  The pelvis was reperitonealized using a  running 3-0  Biosyn.  Irrigation was carried out again.  The sponge, needle, instrument,  and blade counts were reported as correct x2.  The peritoneum was then  closed using #1 Prolene on the fascia and 3-0 Biosyn in the subcutaneous  tissue with staples on the skin.  The patient was awakened from anesthesia  uneventfully and transferred to a bed, taken to the postanesthetic care  unit.                                                Dirk Dress. Katrinka Blazing, M.D.    LCS/MEDQ  D:  06/11/2002  T:  06/11/2002  Job:  [id number]

## 2010-10-22 NOTE — H&P (Signed)
NAME:  Daisy Robbins, Daisy Robbins                         ACCOUNT NO.:  [id number]   MEDICAL RECORD NO.:  [id number]                   PATIENT TYPE:  AMB   LOCATION:  DAY                                  FACILITY:  [facility]   PHYSICIAN:  Jerolyn Shin C. Katrinka Blazing, M.D.                DATE OF BIRTH:  1960/12/23   DATE OF ADMISSION:  DATE OF DISCHARGE:                                HISTORY & PHYSICAL   HISTORY OF PRESENT ILLNESS:  [age]-year-old female with severe  dysfunctional uterine bleeding, severe metrorrhagia and chronic anemia.  The  patient has been anemic for at least five years.  She was having severe  bleeding and pelvic pain dating back to 1997.  It has gotten worse over the  past year.  She now has extreme bleeding with menses last up to 10 days per  month.  During a typical period she uses super pads every 30 minutes for at  least four days.  She uses about 1.5 packs of super pads per day and then  one of regular pads for the next five days.  She has severe clotting with  uncontrolled pain requiring narcotic analgesics.  She also has had to stay  off work.  Hemoglobin has ranged between 8.5 and 9.  Iron saturation has  been about 9%.  She has a very large uterus with submucosal fibroids with  the largest fibroid measuring 8 cm.  She has been on chronic iron therapy.  The patient has been treated with hormonal manipulation without improvement.  She is now scheduled for a hysterectomy.   PAST HISTORY:  The patient has hypertension, but no other illness.   MEDICATIONS:  1. Diovan HCT 160/12.5 q.d.  2. Provera 5 mg t.i.d.  3. Ibuprofen 800 mg t.i.d.   ALLERGIES:  No known drug allergies.   HABITS:  The patient does not smoke, drink or use drugs.   SOCIAL HISTORY:  The patient is married and employed.   The patient is scheduled for a total abdominal hysterectomy.   PHYSICAL EXAMINATION:  VITAL SIGNS:  Blood pressure 150/82, pulse 84 and  respirations 20.  Weight 196 pounds.  Height 5  feet 3 inches.  HEENT:  Unremarkable.  NECK:  Supple.  No JVD or bruits.  CHEST:  Clear to auscultation.  HEART:  Regular rate and rhythm without murmur, gallop or rub.  ABDOMEN:  Soft, nontender and no masses.  PELVIC:  Very enlarged uterus with mild tenderness in the suprapubic area  extending up to the infraumbilical level.  No adnexal masses can be found.  EXTREMITIES:  No cyanosis, clubbing or edema.  NEUROLOGIC EXAMINATION:  Nonfocal.    IMPRESSION:  1. Massive uterine fibroids with submucosal myomata.  2. Severe anemia due to dysfunctional uterine bleeding.  3. Hypertension.   PLAN:  The patient will have a hysterectomy.  She may need to be transfused  preoperatively.  Dirk Dress. Katrinka Blazing, M.D.    LCS/MEDQ  D:  06/10/2002  T:  06/11/2002  Job:  [id number]

## 2010-12-13 ENCOUNTER — Telehealth: Payer: Self-pay | Admitting: Family Medicine

## 2010-12-13 MED ORDER — BENAZEPRIL-HYDROCHLOROTHIAZIDE 20-12.5 MG PO TABS: 1.0000 | ORAL_TABLET | Freq: Every day | ORAL | Status: DC

## 2010-12-13 NOTE — Telephone Encounter (Signed)
Called patient, left message.

## 2010-12-13 NOTE — Telephone Encounter (Signed)
Med sent to another pharmacy that has the med, patient aware

## 2011-01-12 ENCOUNTER — Encounter: Payer: Self-pay | Admitting: Family Medicine

## 2011-01-13 ENCOUNTER — Ambulatory Visit (INDEPENDENT_AMBULATORY_CARE_PROVIDER_SITE_OTHER): Payer: PRIVATE HEALTH INSURANCE | Admitting: Family Medicine

## 2011-01-13 ENCOUNTER — Other Ambulatory Visit: Payer: Self-pay | Admitting: Family Medicine

## 2011-01-13 VITALS — BP 130/82 | HR 72 | Resp 16 | Wt 179.1 lb

## 2011-01-13 DIAGNOSIS — E669 Obesity, unspecified: Secondary | ICD-10-CM

## 2011-01-13 DIAGNOSIS — E119 Type 2 diabetes mellitus without complications: Secondary | ICD-10-CM

## 2011-01-13 DIAGNOSIS — I10 Essential (primary) hypertension: Secondary | ICD-10-CM

## 2011-01-13 DIAGNOSIS — H409 Unspecified glaucoma: Secondary | ICD-10-CM

## 2011-01-13 MED ORDER — BENAZEPRIL-HYDROCHLOROTHIAZIDE 20-12.5 MG PO TABS: 1.0000 | ORAL_TABLET | Freq: Every day | ORAL | Status: DC

## 2011-01-13 MED ORDER — FEXOFENADINE HCL 180 MG PO TABS: 180.0000 mg | ORAL_TABLET | Freq: Every day | ORAL | Status: DC

## 2011-01-13 NOTE — Patient Instructions (Signed)
Cpe  Last week in September   congrats on lifestyle change, you have lost 9 pounds also   hBA1c and chem 7  Today.   Mammogram due sept 30 or after, you may call and schedule  No med changes

## 2011-01-14 LAB — BASIC METABOLIC PANEL
BUN: 13 mg/dL (ref 6–23)
CO2: 30 mEq/L (ref 19–32)
Calcium: 9.7 mg/dL (ref 8.4–10.5)
Creat: 0.81 mg/dL (ref 0.50–1.10)
Glucose, Bld: 101 mg/dL — ABNORMAL HIGH (ref 70–99)
Potassium: 3.8 mEq/L (ref 3.5–5.3)

## 2011-01-14 LAB — HEMOGLOBIN A1C: Mean Plasma Glucose: 166 mg/dL — ABNORMAL HIGH (ref <117)

## 2011-01-15 ENCOUNTER — Encounter: Payer: Self-pay | Admitting: Family Medicine

## 2011-01-15 NOTE — Progress Notes (Signed)
  Subjective:    Patient ID: Daisy Robbins, female    DOB: [birth year], 50 y.o.   MRN: [id number]  HPI The PT is here for follow up and re-evaluation of chronic medical conditions, medication management and review of any available recent lab and radiology data.  Preventive health is updated, specifically  Cancer screening and Immunization.   Questions or concerns regarding consultations or procedures which the PT has had in the interim are  addressed. The PT denies any adverse reactions to current medications since the last visit.  There are no new concerns.  There are no specific complaints . Has comited to regular daily exercise at her job before starting work, which she absolutely enjoys, and has lost weight. States she feels well, when she does test her sugars they are seldom over 120      Review of Systems Denies recent fever or chills. Denies sinus pressure, nasal congestion, ear pain or sore throat. Denies chest congestion, productive cough or wheezing. Denies chest pains, palpitations and leg swelling Denies abdominal pain, nausea, vomiting,diarrhea or constipation.   Denies dysuria, frequency, hesitancy or incontinence. Denies joint pain, swelling and limitation in mobility. Denies headaches, seizures, numbness, or tingling. Denies depression, anxiety or insomnia. Denies skin break down or rash.        Objective:   Physical Exam Patient alert and oriented and in no cardiopulmonary distress.  HEENT: No facial asymmetry, EOMI, no sinus tenderness,  oropharynx pink and moist.  Neck supple no adenopathy.  Chest: Clear to auscultation bilaterally.  CVS: S1, S2 no murmurs, no S3.  ABD: Soft non tender. Bowel sounds normal.  Ext: No edema  MS: Adequate ROM spine, shoulders, hips and knees.  Skin: Intact, no ulcerations or rash noted.  Psych: Good eye contact, normal affect. Memory intact not anxious or depressed appearing.  CNS: CN 2-12 intact, power, tone and  sensation normal throughout. Diabetic Foot Check:  Appearance - no lesions, ulcers or calluses Skin - no unusual pallor or redness Sensation - grossly intact to light touch Monofilament testing -  Right - Great toe, medial, central, lateral ball and posterior foot intact Left - Great toe, medial, central, lateral ball and posterior foot intact Pulses Left - Dorsalis Pedis and Posterior Tibia normal Right - Dorsalis Pedis and Posterior Tibia normal        Assessment & Plan:

## 2011-01-15 NOTE — Assessment & Plan Note (Signed)
Improved. Pt applauded on succesful weight loss through lifestyle change, and encouraged to continue same. Weight loss goal set for the next several months.  

## 2011-01-15 NOTE — Assessment & Plan Note (Signed)
Controlled, no change in medication  

## 2011-01-15 NOTE — Assessment & Plan Note (Signed)
Will check lab to determine control. Reports good lifestyle changes, expect that it is controlled

## 2011-01-15 NOTE — Assessment & Plan Note (Signed)
Followed by opthalmology.       

## 2011-02-25 ENCOUNTER — Encounter: Payer: Self-pay | Admitting: Family Medicine

## 2011-02-28 ENCOUNTER — Encounter: Payer: PRIVATE HEALTH INSURANCE | Admitting: Family Medicine

## 2011-05-05 ENCOUNTER — Other Ambulatory Visit: Payer: Self-pay | Admitting: Family Medicine

## 2011-08-16 ENCOUNTER — Other Ambulatory Visit: Payer: Self-pay | Admitting: Family Medicine

## 2011-09-06 ENCOUNTER — Encounter: Payer: Self-pay | Admitting: Family Medicine

## 2011-10-05 ENCOUNTER — Other Ambulatory Visit: Payer: Self-pay | Admitting: Family Medicine

## 2011-11-03 ENCOUNTER — Encounter: Payer: Self-pay | Admitting: Family Medicine

## 2011-11-03 ENCOUNTER — Ambulatory Visit (INDEPENDENT_AMBULATORY_CARE_PROVIDER_SITE_OTHER): Payer: PRIVATE HEALTH INSURANCE | Admitting: Family Medicine

## 2011-11-03 VITALS — BP 134/76 | HR 78 | Resp 18 | Ht 63.0 in | Wt 183.0 lb

## 2011-11-03 DIAGNOSIS — E669 Obesity, unspecified: Secondary | ICD-10-CM

## 2011-11-03 DIAGNOSIS — Z1211 Encounter for screening for malignant neoplasm of colon: Secondary | ICD-10-CM

## 2011-11-03 DIAGNOSIS — R5381 Other malaise: Secondary | ICD-10-CM

## 2011-11-03 DIAGNOSIS — J45901 Unspecified asthma with (acute) exacerbation: Secondary | ICD-10-CM

## 2011-11-03 DIAGNOSIS — I10 Essential (primary) hypertension: Secondary | ICD-10-CM

## 2011-11-03 DIAGNOSIS — E119 Type 2 diabetes mellitus without complications: Secondary | ICD-10-CM

## 2011-11-03 DIAGNOSIS — J209 Acute bronchitis, unspecified: Secondary | ICD-10-CM | POA: Insufficient documentation

## 2011-11-03 DIAGNOSIS — R5383 Other fatigue: Secondary | ICD-10-CM

## 2011-11-03 DIAGNOSIS — Z23 Encounter for immunization: Secondary | ICD-10-CM

## 2011-11-03 MED ORDER — BENZONATATE 100 MG PO CAPS: 100.0000 mg | ORAL_CAPSULE | Freq: Four times a day (QID) | ORAL | Status: DC | PRN

## 2011-11-03 MED ORDER — BENAZEPRIL-HYDROCHLOROTHIAZIDE 20-12.5 MG PO TABS: 1.0000 | ORAL_TABLET | Freq: Every day | ORAL | Status: DC

## 2011-11-03 MED ORDER — METFORMIN HCL 1000 MG PO TABS: 1000.0000 mg | ORAL_TABLET | Freq: Two times a day (BID) | ORAL | Status: DC

## 2011-11-03 MED ORDER — PENICILLIN V POTASSIUM 500 MG PO TABS: 500.0000 mg | ORAL_TABLET | Freq: Three times a day (TID) | ORAL | Status: AC

## 2011-11-03 NOTE — Progress Notes (Signed)
  Subjective:    Patient ID: Daisy Robbins, female    DOB: September 24, 1960, 51 y.o.   MRN: [id number]  HPI The PT is here for follow up and re-evaluation of chronic medical conditions, medication management and review of any available recent lab and radiology data.  Preventive health is updated, specifically  Cancer screening and Immunization.   Questions or concerns regarding consultations or procedures which the PT has had in the interim are  addressed. The PT denies any adverse reactions to current medications since the last visit.  There are no new concerns.  C/o chest congestion with yellow sputum x 1 week Denies polyuria or polydipsia, when she tests blood sugars fasting sugars are seldom over 120      Review of Systems See HPI Denies recent fever or chills. Denies sinus pressure, nasal congestion, ear pain or sore throat.  Denies chest pains, palpitations and leg swelling Denies abdominal pain, nausea, vomiting,diarrhea or constipation.   Denies dysuria, frequency, hesitancy or incontinence. Denies joint pain, swelling and limitation in mobility. Denies headaches, seizures, numbness, or tingling. Denies depression, anxiety or insomnia. Denies skin break down or rash.        Objective:   Physical Exam Patient alert and oriented and in no cardiopulmonary distress.  HEENT: No facial asymmetry, EOMI, no sinus tenderness,  oropharynx pink and moist.  Neck supple no adenopathy.  Chest: decreased air entry scattered crackles, no wheezes  CVS: S1, S2 no murmurs, no S3.  ABD: Soft non tender. Bowel sounds normal.  Ext: No edema  MS: Adequate ROM spine, shoulders, hips and knees.  Skin: Intact, no ulcerations or rash noted.  Psych: Good eye contact, normal affect. Memory intact not anxious or depressed appearing.  CNS: CN 2-12 intact, power, tone and sensation normal throughout.        Assessment & Plan:

## 2011-11-03 NOTE — Patient Instructions (Addendum)
CPE last week in September.  TdaP today You are treated for acute bronchitis.  CBc, HBa1C, lipid, cmp and TSH today, and vit D. Microalb will be sent from office   You are referred for colonoscopy.This is important to screen for colon cancer   It is important that you exercise regularly at least 30 minutes 5 times a week. If you develop chest pain, have severe difficulty breathing, or feel very tired, stop exercising immediately and seek medical attention  A healthy diet is rich in fruit, vegetables and whole grains. Poultry fish, nuts and beans are a healthy choice for protein rather then red meat. A low sodium diet and drinking 64 ounces of water daily is generally recommended. Oils and sweet should be limited. Carbohydrates especially for those who are diabetic or overweight, should be limited to 30-45 gram per meal. It is important to eat on a regular schedule, at least 3 times daily. Snacks should be primarily fruits, vegetables or nuts.

## 2011-11-04 LAB — MICROALBUMIN / CREATININE URINE RATIO: Creatinine, Urine: 65.4 mg/dL

## 2011-11-05 LAB — COMPREHENSIVE METABOLIC PANEL
Alkaline Phosphatase: 89 U/L (ref 39–117)
Glucose, Bld: 93 mg/dL (ref 70–99)
Sodium: 139 mEq/L (ref 135–145)
Total Bilirubin: 0.3 mg/dL (ref 0.3–1.2)
Total Protein: 6.9 g/dL (ref 6.0–8.3)

## 2011-11-05 LAB — VITAMIN D 25 HYDROXY (VIT D DEFICIENCY, FRACTURES): Vit D, 25-Hydroxy: 25 ng/mL — ABNORMAL LOW (ref 30–89)

## 2011-11-05 LAB — CBC
MCH: 23.6 pg — ABNORMAL LOW (ref 26.0–34.0)
MCV: 71.6 fL — ABNORMAL LOW (ref 78.0–100.0)
Platelets: 266 10*3/uL (ref 150–400)
RDW: 14.8 % (ref 11.5–15.5)

## 2011-11-05 LAB — LIPID PANEL
LDL Cholesterol: 67 mg/dL (ref 0–99)
Triglycerides: 102 mg/dL (ref <150)
VLDL: 20 mg/dL (ref 0–40)

## 2011-11-06 NOTE — Assessment & Plan Note (Signed)
Controlled, no change in medication  

## 2011-11-06 NOTE — Assessment & Plan Note (Signed)
Acute infection, antibiotics prescribed 

## 2011-11-06 NOTE — Assessment & Plan Note (Signed)
Unchanged. Patient re-educated about  the importance of commitment to a  minimum of 150 minutes of exercise per week. The importance of healthy food choices with portion control discussed. Encouraged to start a food diary, count calories and to consider  joining a support group. Sample diet sheets offered. Goals set by the patient for the next several months.    

## 2011-11-08 ENCOUNTER — Encounter (INDEPENDENT_AMBULATORY_CARE_PROVIDER_SITE_OTHER): Payer: Self-pay | Admitting: *Deleted

## 2012-01-03 ENCOUNTER — Telehealth: Payer: Self-pay | Admitting: Family Medicine

## 2012-01-03 MED ORDER — METFORMIN HCL 1000 MG PO TABS: 1000.0000 mg | ORAL_TABLET | Freq: Two times a day (BID) | ORAL | Status: DC

## 2012-01-03 NOTE — Telephone Encounter (Signed)
Med refilled.

## 2012-01-12 ENCOUNTER — Telehealth: Payer: Self-pay | Admitting: Family Medicine

## 2012-01-12 NOTE — Telephone Encounter (Signed)
erx fluconazole 1`50mg  #1 only and let her know pls

## 2012-01-13 MED ORDER — FLUCONAZOLE 150 MG PO TABS: 150.0000 mg | ORAL_TABLET | Freq: Once | ORAL | Status: DC

## 2012-01-13 NOTE — Telephone Encounter (Signed)
Med sent.

## 2012-01-16 ENCOUNTER — Other Ambulatory Visit (INDEPENDENT_AMBULATORY_CARE_PROVIDER_SITE_OTHER): Payer: Self-pay | Admitting: *Deleted

## 2012-01-16 ENCOUNTER — Telehealth (INDEPENDENT_AMBULATORY_CARE_PROVIDER_SITE_OTHER): Payer: Self-pay | Admitting: *Deleted

## 2012-01-16 ENCOUNTER — Encounter (INDEPENDENT_AMBULATORY_CARE_PROVIDER_SITE_OTHER): Payer: Self-pay | Admitting: *Deleted

## 2012-01-16 DIAGNOSIS — Z1211 Encounter for screening for malignant neoplasm of colon: Secondary | ICD-10-CM

## 2012-01-16 NOTE — Telephone Encounter (Signed)
Patient needs movi prep 

## 2012-01-17 MED ORDER — PEG-KCL-NACL-NASULF-NA ASC-C 100 G PO SOLR: 1.0000 | Freq: Once | ORAL | Status: DC

## 2012-02-02 ENCOUNTER — Telehealth (INDEPENDENT_AMBULATORY_CARE_PROVIDER_SITE_OTHER): Payer: Self-pay | Admitting: *Deleted

## 2012-02-02 NOTE — Telephone Encounter (Signed)
PCP/Requesting MD: simpson  Name & DOB: Daisy Robbins 06/26/1960      Procedure: tcs  Reason/Indication:  screening  Has patient had this procedure before?  no  If so, when, by whom and where?    Is there a family history of colon cancer?  no  Who?  What age when diagnosed?    Is patient diabetic?   yes      Does patient have prosthetic heart valve?  no  Do you have a pacemaker?  no  Has patient had joint replacement within last 12 months?  no  Is patient on Coumadin, Plavix and/or Aspirin? no  Medications: metformin 100 mg bid, benazepril/hctz 20/12.5 mg daily, one a day vitamin  Allergies: nkda  Medication Adjustment: 1/2 metformin day before  Procedure date & time: 03/01/12 at 950

## 2012-02-03 NOTE — Telephone Encounter (Signed)
agree

## 2012-02-28 ENCOUNTER — Ambulatory Visit (INDEPENDENT_AMBULATORY_CARE_PROVIDER_SITE_OTHER): Payer: PRIVATE HEALTH INSURANCE | Admitting: Family Medicine

## 2012-02-28 ENCOUNTER — Encounter: Payer: Self-pay | Admitting: Family Medicine

## 2012-02-28 VITALS — BP 130/82 | HR 67 | Resp 15 | Ht 63.0 in | Wt 186.0 lb

## 2012-02-28 DIAGNOSIS — Z0000 Encounter for general adult medical examination without abnormal findings: Secondary | ICD-10-CM

## 2012-02-28 DIAGNOSIS — E119 Type 2 diabetes mellitus without complications: Secondary | ICD-10-CM

## 2012-02-28 DIAGNOSIS — E669 Obesity, unspecified: Secondary | ICD-10-CM

## 2012-02-28 MED ORDER — METFORMIN HCL 1000 MG PO TABS: 1000.0000 mg | ORAL_TABLET | Freq: Two times a day (BID) | ORAL | Status: DC

## 2012-02-28 MED ORDER — BENAZEPRIL-HYDROCHLOROTHIAZIDE 20-12.5 MG PO TABS: 1.0000 | ORAL_TABLET | Freq: Every day | ORAL | Status: DC

## 2012-02-28 NOTE — Progress Notes (Signed)
  Subjective:    Patient ID: Daisy Robbins, female    DOB: 1960-10-14, 51 y.o.   MRN: [id number]  HPI The PT is here for annual exam  and re-evaluation of chronic medical conditions, medication management and review of any available recent lab and radiology data.  Preventive health is updated, specifically  Cancer screening and Immunization.   Questions or concerns regarding consultations or procedures which the PT has had in the interim are  addressed. The PT denies any adverse reactions to current medications since the last visit.  There are no new concerns.  There are no specific complaints except that she is frustrated with lack of weight loss despite more commitment to physical activity  Tests her blood sugars and states they are good      Review of Systems See HPI Denies recent fever or chills. Denies sinus pressure, nasal congestion, ear pain or sore throat. Denies chest congestion, productive cough or wheezing. Denies chest pains, palpitations and leg swelling Denies abdominal pain, nausea, vomiting,diarrhea or constipation.   Denies dysuria, frequency, hesitancy or incontinence. Denies joint pain, swelling and limitation in mobility. Denies headaches, seizures, numbness, or tingling. Denies depression, anxiety or insomnia. Denies skin break down or rash.        Objective:   Physical Exam   Pleasant well nourished female, alert and oriented x 3, in no cardio-pulmonary distress. Afebrile. HEENT No facial trauma or asymetry. Sinuses non tender.  EOMI, PERTL, fundoscopic exam  no hemorhage or exudate.  External ears normal, tympanic membranes clear. Oropharynx moist, no exudate, fair  dentition. Neck: supple, no adenopathy,JVD or thyromegaly.No bruits.  Chest: Clear to ascultation bilaterally.No crackles or wheezes. Non tender to palpation  Breast: No asymetry,no masses. No nipple discharge or inversion. No axillary or supraclavicular  adenopathy  Cardiovascular system; Heart sounds normal,  S1 and  S2 ,no S3.  No murmur, or thrill. Apical beat not displaced Peripheral pulses normal.  Abdomen: Soft, non tender, no organomegaly or masses. No bruits. Bowel sounds normal. No guarding, tenderness or rebound.  GU: External genitalia normal. No lesions. Vaginal canal normal.No discharge. Uterusabsent, no adnexal masses, no  adnexal tenderness.  Musculoskeletal exam: Full ROM of spine, hips , shoulders and knees. No deformity ,swelling or crepitus noted. No muscle wasting or atrophy.   Neurologic: Cranial nerves 2 to 12 intact. Power, tone ,sensation and reflexes normal throughout. No disturbance in gait. No tremor.  Skin: Intact, no ulceration, erythema , scaling or rash noted. Pigmentation normal throughout  Psych; Normal mood and affect. Judgement and concentration normal  Diabetic Foot Check:  Appearance - no lesions, ulcers or calluses Skin - no unusual pallor or redness Sensation - grossly intact to light touch Monofilament testing -  Right - Great toe, medial, central, lateral ball and posterior foot intact Left - Great toe, medial, central, lateral ball and posterior foot intact Pulses Left - Dorsalis Pedis and Posterior Tibia normal Right - Dorsalis Pedis and Posterior Tibia normal     Assessment & Plan:

## 2012-02-28 NOTE — Patient Instructions (Addendum)
F/u end February.  Fl;u vaccine today, and all the best with the colonoscopy.  No med changes  Please leave your labs from work in October here  It is important that you exercise regularly at least 30 minutes 5 times a week. If you develop chest pain, have severe difficulty breathing, or feel very tired, stop exercising immediately and seek medical attention   A healthy diet is rich in fruit, vegetables and whole grains. Poultry fish, nuts and beans are a healthy choice for protein rather then red meat. A low sodium diet and drinking 64 ounces of water daily is generally recommended. Oils and sweet should be limited. Carbohydrates especially for those who are diabetic or overweight, should be limited to 30-45 gram per meal. It is important to eat on a regular schedule, at least 3 times daily. Snacks should be primarily fruits, vegetables or nuts.  Weight loss goal of 2 to 3 pounds per month HBA1C and chem 7 non fast in Feb before visit

## 2012-02-29 MED ORDER — SODIUM CHLORIDE 0.45 % IV SOLN
INTRAVENOUS | Status: DC
  Administered 2012-03-01: 09:00:00 via INTRAVENOUS

## 2012-03-01 ENCOUNTER — Ambulatory Visit (HOSPITAL_COMMUNITY)
Admission: RE | Admit: 2012-03-01 | Discharge: 2012-03-01 | Disposition: A | Discharge status: Home / Self Care | Payer: 59 | Source: Ambulatory Visit | Attending: Internal Medicine | Admitting: Internal Medicine

## 2012-03-01 ENCOUNTER — Encounter (HOSPITAL_COMMUNITY)
Admission: RE | Disposition: A | Discharge status: Home / Self Care | Payer: Self-pay | Source: Ambulatory Visit | Attending: Internal Medicine

## 2012-03-01 ENCOUNTER — Encounter (HOSPITAL_COMMUNITY): Payer: Self-pay | Admitting: *Deleted

## 2012-03-01 DIAGNOSIS — D126 Benign neoplasm of colon, unspecified: Secondary | ICD-10-CM | POA: Insufficient documentation

## 2012-03-01 DIAGNOSIS — E119 Type 2 diabetes mellitus without complications: Secondary | ICD-10-CM | POA: Insufficient documentation

## 2012-03-01 DIAGNOSIS — K644 Residual hemorrhoidal skin tags: Secondary | ICD-10-CM

## 2012-03-01 DIAGNOSIS — Z1211 Encounter for screening for malignant neoplasm of colon: Secondary | ICD-10-CM | POA: Insufficient documentation

## 2012-03-01 DIAGNOSIS — Z01812 Encounter for preprocedural laboratory examination: Secondary | ICD-10-CM | POA: Insufficient documentation

## 2012-03-01 DIAGNOSIS — I10 Essential (primary) hypertension: Secondary | ICD-10-CM | POA: Insufficient documentation

## 2012-03-01 HISTORY — PX: COLONOSCOPY: SHX5424

## 2012-03-01 SURGERY — COLONOSCOPY
Anesthesia: Moderate Sedation

## 2012-03-01 MED ORDER — MIDAZOLAM HCL 5 MG/5ML IJ SOLN
INTRAMUSCULAR | Status: DC | PRN
  Administered 2012-03-01: 1 mg via INTRAVENOUS
  Administered 2012-03-01 (×2): 2 mg via INTRAVENOUS

## 2012-03-01 MED ORDER — MEPERIDINE HCL 50 MG/ML IJ SOLN
INTRAMUSCULAR | Status: AC
  Filled 2012-03-01: qty 1

## 2012-03-01 MED ORDER — STERILE WATER FOR IRRIGATION IR SOLN
Status: DC | PRN
  Administered 2012-03-01: 10:00:00

## 2012-03-01 MED ORDER — MIDAZOLAM HCL 5 MG/5ML IJ SOLN
INTRAMUSCULAR | Status: AC
  Filled 2012-03-01: qty 10

## 2012-03-01 MED ORDER — MEPERIDINE HCL 50 MG/ML IJ SOLN
INTRAMUSCULAR | Status: DC | PRN
  Administered 2012-03-01 (×2): 25 mg via INTRAVENOUS

## 2012-03-01 NOTE — H&P (Signed)
Daisy Robbins is an 51 y.o. female.   Chief Complaint: Patient is here for colonoscopy. HPI: Patient is 51 year old African female who is in for screening colonoscopy. Patient denies abdominal pain, rectal bleeding or change in her bowel habits. She has good appetite and her weight has been stable. Family history is negative for colorectal carcinoma.  Past Medical History  Diagnosis Date  . Allergy   . Diabetes mellitus   . Hypertension     Past Surgical History  Procedure Date  . Cesarean section   . Abdominal hysterectomy   . Right carpal tunnel release     Family History  Problem Relation Age of Onset  . Hypertension Mother   . Cancer Father     colon   . Diabetes Sister   . Colon cancer Neg Hx    Social History:  reports that she has never smoked. She does not have any smokeless tobacco history on file. She reports that she does not drink alcohol or use illicit drugs.  Allergies: No Known Allergies  Medications Prior to Admission  Medication Sig Dispense Refill  . benazepril-hydrochlorthiazide (LOTENSIN HCT) 20-12.5 MG per tablet Take 1 tablet by mouth daily.  30 tablet  5  . metFORMIN (GLUCOPHAGE) 1000 MG tablet Take 1 tablet (1,000 mg total) by mouth 2 (two) times daily with a meal.  180 tablet  1  . peg 3350 powder (MOVIPREP) 100 G SOLR Take 1 kit (100 g total) by mouth once.  1 kit  0  . fexofenadine (ALLEGRA) 180 MG tablet Take 1 tablet (180 mg total) by mouth daily.  30 tablet  5    Results for orders placed during the hospital encounter of 03/01/12 (from the past 48 hour(s))  GLUCOSE, CAPILLARY     Status: Abnormal   Collection Time   03/01/12  8:40 AM      Component Value Range Comment   Glucose-Capillary 109 (*) 70 - 99 mg/dL    No results found.  ROS  Blood pressure 163/94, pulse 79, temperature 97.9 F (36.6 C), temperature source Oral, resp. rate 15, height 5\' 3"  (1.6 m), weight 186 lb (84.369 kg), SpO2 97.00%. Physical Exam  Constitutional: She  appears well-developed and well-nourished.  HENT:  Mouth/Throat: Oropharynx is clear and moist.  Eyes: Conjunctivae normal are normal. No scleral icterus.  Neck: No thyromegaly present.  Cardiovascular: Normal rate, regular rhythm and normal heart sounds.   No murmur heard. Respiratory: Effort normal and breath sounds normal.  GI: Soft. She exhibits mass. She exhibits no distension. There is no tenderness.       lower midline scar  Musculoskeletal: She exhibits no edema.  Lymphadenopathy:    She has no cervical adenopathy.  Neurological: She is alert.  Skin: Skin is warm and dry.     Assessment/Plan Average risk screening colonoscopy.  REHMAN,NAJEEB U 03/01/2012, 9:43 AM

## 2012-03-01 NOTE — Op Note (Signed)
COLONOSCOPY PROCEDURE REPORT  PATIENT:  Daisy Robbins  MR#:  [id number] Birthdate:  [birth year], 51 y.o., female Endoscopist:  Dr. Malissa Hippo, MD Referred By:  Dr. Syliva Overman, MD Procedure Date: 03/01/2012  Procedure:   Colonoscopy  Indications: Patient is 51 year old African female was undergoing average risk screening colonoscopy.  Informed Consent:  The procedure and risks were reviewed with the patient and informed consent was obtained.  Medications:  Demerol 50 mg IV Versed 5 mg IV  Description of procedure:  After a digital rectal exam was performed, that colonoscope was advanced from the anus through the rectum and colon to the area of the cecum, ileocecal valve and appendiceal orifice. The cecum was deeply intubated. These structures were well-seen and photographed for the record. From the level of the cecum and ileocecal valve, the scope was slowly and cautiously withdrawn. The mucosal surfaces were carefully surveyed utilizing scope tip to flexion to facilitate fold flattening as needed. The scope was pulled down into the rectum where a thorough exam including retroflexion was performed.  Findings:   Prep excellent. Small polyps ablated via cold biopsy from hepatic flexure and submitted together. Normal rectal mucosa. Small hemorrhoids below the dentate line.  Therapeutic/Diagnostic Maneuvers Performed:  See above  Complications:  None  Cecal Withdrawal Time:  9 minutes  Impression:  Examination performed to cecum. Two small polyps ablated via cold biopsy from hepatic flexure and submitted together. Small external hemorrhoids.  Recommendations:  Standard instructions given. I will contact patient with biopsy results and further recommendations.   REHMAN,NAJEEB U  03/01/2012 10:19 AM  CC: Dr. Syliva Overman, MD & Dr. Bonnetta Barry ref. provider found

## 2012-03-02 ENCOUNTER — Encounter (HOSPITAL_COMMUNITY): Payer: Self-pay | Admitting: *Deleted

## 2012-03-02 ENCOUNTER — Emergency Department (HOSPITAL_COMMUNITY): Payer: PRIVATE HEALTH INSURANCE

## 2012-03-02 ENCOUNTER — Emergency Department (HOSPITAL_COMMUNITY)
Admission: EM | Admit: 2012-03-02 | Discharge: 2012-03-02 | Disposition: A | Discharge status: Home / Self Care | Payer: PRIVATE HEALTH INSURANCE | Attending: Emergency Medicine | Admitting: Emergency Medicine

## 2012-03-02 DIAGNOSIS — I10 Essential (primary) hypertension: Secondary | ICD-10-CM | POA: Insufficient documentation

## 2012-03-02 DIAGNOSIS — E119 Type 2 diabetes mellitus without complications: Secondary | ICD-10-CM | POA: Insufficient documentation

## 2012-03-02 DIAGNOSIS — R1032 Left lower quadrant pain: Secondary | ICD-10-CM | POA: Insufficient documentation

## 2012-03-02 LAB — COMPREHENSIVE METABOLIC PANEL
Albumin: 4.1 g/dL (ref 3.5–5.2)
BUN: 15 mg/dL (ref 6–23)
Calcium: 10.5 mg/dL (ref 8.4–10.5)
Chloride: 101 mEq/L (ref 96–112)
Creatinine, Ser: 0.91 mg/dL (ref 0.50–1.10)
Total Bilirubin: 0.2 mg/dL — ABNORMAL LOW (ref 0.3–1.2)

## 2012-03-02 LAB — URINALYSIS, ROUTINE W REFLEX MICROSCOPIC
Ketones, ur: NEGATIVE mg/dL
Leukocytes, UA: NEGATIVE
Nitrite: NEGATIVE
Protein, ur: NEGATIVE mg/dL
pH: 5.5 (ref 5.0–8.0)

## 2012-03-02 LAB — CBC WITH DIFFERENTIAL/PLATELET
Basophils Relative: 1 % (ref 0–1)
Eosinophils Absolute: 0.3 10*3/uL (ref 0.0–0.7)
Eosinophils Relative: 2 % (ref 0–5)
HCT: 40.1 % (ref 36.0–46.0)
Hemoglobin: 13.6 g/dL (ref 12.0–15.0)
MCH: 24.9 pg — ABNORMAL LOW (ref 26.0–34.0)
MCHC: 33.9 g/dL (ref 30.0–36.0)
MCV: 73.4 fL — ABNORMAL LOW (ref 78.0–100.0)
Monocytes Absolute: 0.5 10*3/uL (ref 0.1–1.0)
Monocytes Relative: 5 % (ref 3–12)
Neutro Abs: 8.4 10*3/uL — ABNORMAL HIGH (ref 1.7–7.7)

## 2012-03-02 LAB — URINE MICROSCOPIC-ADD ON

## 2012-03-02 LAB — LIPASE, BLOOD: Lipase: 27 U/L (ref 11–59)

## 2012-03-02 MED ORDER — ONDANSETRON HCL 4 MG/2ML IJ SOLN: 4.0000 mg | Freq: Once | INTRAMUSCULAR | Status: DC

## 2012-03-02 MED ORDER — ONDANSETRON HCL 4 MG/2ML IJ SOLN
4.0000 mg | Freq: Once | INTRAMUSCULAR | Status: AC
  Administered 2012-03-02: 4 mg via INTRAVENOUS
  Filled 2012-03-02: qty 2

## 2012-03-02 MED ORDER — PROMETHAZINE HCL 25 MG/ML IJ SOLN
25.0000 mg | Freq: Once | INTRAMUSCULAR | Status: AC
  Administered 2012-03-02: 25 mg via INTRAVENOUS
  Filled 2012-03-02: qty 1

## 2012-03-02 MED ORDER — SODIUM CHLORIDE 0.9 % IV BOLUS (SEPSIS)
1000.0000 mL | Freq: Once | INTRAVENOUS | Status: AC
  Administered 2012-03-02: 1000 mL via INTRAVENOUS

## 2012-03-02 MED ORDER — HYDROMORPHONE HCL PF 1 MG/ML IJ SOLN
1.0000 mg | Freq: Once | INTRAMUSCULAR | Status: AC
  Administered 2012-03-02: 1 mg via INTRAVENOUS
  Filled 2012-03-02: qty 1

## 2012-03-02 MED ORDER — ONDANSETRON 8 MG PO TBDP: 8.0000 mg | ORAL_TABLET | Freq: Three times a day (TID) | ORAL | Status: DC | PRN

## 2012-03-02 NOTE — ED Provider Notes (Signed)
History     CSN: [id number]  Arrival date & time 03/02/12  1631   First MD Initiated Contact with Patient 03/02/12 1647      Chief Complaint  Patient presents with  . Abdominal Pain    (Consider location/radiation/quality/duration/timing/severity/associated sxs/prior treatment) HPI  Patient had colonoscopy done yesterday by Dr. Karilyn Cota.  Today began having abdominal pain at 1100 a.m.  Pain is on left side and crampy in nature, constant, waxes and wanes, no nausea, vomiting, or diarrhea.  She had loose stool and ate sandwich.  No blood per rectum.  Patient had first colonoscopy for screening.  PMD is Dr. Lodema Hong.    Past Medical History  Diagnosis Date  . Allergy   . Diabetes mellitus   . Hypertension     Past Surgical History  Procedure Date  . Cesarean section   . Abdominal hysterectomy   . Right carpal tunnel release     Family History  Problem Relation Age of Onset  . Hypertension Mother   . Cancer Father     colon   . Diabetes Sister   . Colon cancer Neg Hx     History  Substance Use Topics  . Smoking status: Never Smoker   . Smokeless tobacco: Not on file  . Alcohol Use: No    OB History    Grav Para Term Preterm Abortions TAB SAB Ect Mult Living                  Review of Systems  Constitutional: Negative for fever, chills, activity change, appetite change and unexpected weight change.  HENT: Negative for sore throat, rhinorrhea, neck pain, neck stiffness and sinus pressure.   Eyes: Negative for visual disturbance.  Respiratory: Negative for cough and shortness of breath.   Cardiovascular: Negative for chest pain and leg swelling.  Gastrointestinal: Positive for abdominal pain and diarrhea. Negative for vomiting and blood in stool.  Genitourinary: Negative for dysuria, urgency, frequency, vaginal discharge and difficulty urinating.  Musculoskeletal: Negative for myalgias, arthralgias and gait problem.  Skin: Negative for color change and rash.    Neurological: Negative for weakness, light-headedness and headaches.  Hematological: Does not bruise/bleed easily.  Psychiatric/Behavioral: Negative for dysphoric mood.    Allergies  Review of patient's allergies indicates no known allergies.  Home Medications   Current Outpatient Rx  Name Route Sig Dispense Refill  . BENAZEPRIL-HYDROCHLOROTHIAZIDE 20-12.5 MG PO TABS Oral Take 1 tablet by mouth daily. 30 tablet 5  . FEXOFENADINE HCL 180 MG PO TABS Oral Take 1 tablet (180 mg total) by mouth daily. 30 tablet 5  . METFORMIN HCL 1000 MG PO TABS Oral Take 1 tablet (1,000 mg total) by mouth 2 (two) times daily with a meal. 180 tablet 1    BP 135/78  Pulse 67  Temp 98.7 F (37.1 C) (Oral)  Resp 18  Ht 5\' 3"  (1.6 m)  Wt 185 lb (83.915 kg)  BMI 32.77 kg/m2  SpO2 100%  Physical Exam  Nursing note and vitals reviewed. Constitutional: She appears well-developed and well-nourished.  HENT:  Head: Normocephalic and atraumatic.  Eyes: Conjunctivae normal and EOM are normal. Pupils are equal, round, and reactive to light.  Neck: Normal range of motion. Neck supple.  Cardiovascular: Normal rate, regular rhythm, normal heart sounds and intact distal pulses.   Pulmonary/Chest: Effort normal and breath sounds normal.  Abdominal: Soft. Bowel sounds are normal.  Musculoskeletal: Normal range of motion.  Neurological: She is alert.  Skin: Skin is  warm and dry.  Psychiatric: She has a normal mood and affect. Thought content normal.    ED Course  Procedures (including critical care time)  Labs Reviewed - No data to display No results found.   No diagnosis found.    MDM  Patient with resolution of pain but continues with some nausea which may be secondary to pain meds.  Results and exam discussed with Dr. Karilyn Cota.  Patient will be discharged home and is cautioned to return if worse at any time especially worsening pain, fever, or bleeding.        Hilario Quarry, MD 03/02/12 [phone]

## 2012-03-02 NOTE — ED Notes (Signed)
abd and back pain, Had colonoscopy yesterday with removal of polyps.  No n/v,

## 2012-03-02 NOTE — ED Notes (Signed)
Pt vomited when returned to the room from getting a urine sample. Medication given as ordered.

## 2012-03-07 ENCOUNTER — Encounter (HOSPITAL_COMMUNITY): Payer: Self-pay | Admitting: Internal Medicine

## 2012-03-08 ENCOUNTER — Encounter (INDEPENDENT_AMBULATORY_CARE_PROVIDER_SITE_OTHER): Payer: Self-pay | Admitting: *Deleted

## 2012-03-13 DIAGNOSIS — Z0000 Encounter for general adult medical examination without abnormal findings: Secondary | ICD-10-CM | POA: Insufficient documentation

## 2012-03-13 NOTE — Assessment & Plan Note (Addendum)
Pelvic and breast exam performed and documented as well as general medical exam Rectal exam deferred as pt is to have a colonoscopy in the near  Future Dietary and exercise counseling done. Weight loss goals set

## 2012-03-23 ENCOUNTER — Other Ambulatory Visit: Payer: Self-pay | Admitting: Family Medicine

## 2012-07-03 ENCOUNTER — Ambulatory Visit: Payer: PRIVATE HEALTH INSURANCE | Admitting: Family Medicine

## 2012-09-14 ENCOUNTER — Ambulatory Visit (INDEPENDENT_AMBULATORY_CARE_PROVIDER_SITE_OTHER): Payer: 59 | Admitting: Family Medicine

## 2012-09-14 ENCOUNTER — Encounter: Payer: Self-pay | Admitting: Family Medicine

## 2012-09-14 VITALS — BP 128/74 | HR 80 | Resp 18 | Ht 63.0 in | Wt 182.0 lb

## 2012-09-14 DIAGNOSIS — R062 Wheezing: Secondary | ICD-10-CM

## 2012-09-14 DIAGNOSIS — J209 Acute bronchitis, unspecified: Secondary | ICD-10-CM

## 2012-09-14 DIAGNOSIS — J019 Acute sinusitis, unspecified: Secondary | ICD-10-CM

## 2012-09-14 MED ORDER — AMOXICILLIN 500 MG PO CAPS: 500.0000 mg | ORAL_CAPSULE | Freq: Three times a day (TID) | ORAL | Status: DC

## 2012-09-14 MED ORDER — PREDNISONE 10 MG PO TABS: ORAL_TABLET | ORAL | Status: DC

## 2012-09-14 MED ORDER — ALBUTEROL SULFATE HFA 108 (90 BASE) MCG/ACT IN AERS: 2.0000 | INHALATION_SPRAY | RESPIRATORY_TRACT | Status: DC | PRN

## 2012-09-14 MED ORDER — ALBUTEROL SULFATE (5 MG/ML) 0.5% IN NEBU
2.5000 mg | INHALATION_SOLUTION | Freq: Once | RESPIRATORY_TRACT | Status: AC
  Administered 2012-09-14: 2.5 mg via RESPIRATORY_TRACT

## 2012-09-14 MED ORDER — GUAIFENESIN-CODEINE 100-10 MG/5ML PO SYRP: 5.0000 mL | ORAL_SOLUTION | Freq: Three times a day (TID) | ORAL | Status: DC | PRN

## 2012-09-14 NOTE — Assessment & Plan Note (Signed)
Treat with antibiotics, prednisone and albuterol

## 2012-09-14 NOTE — Assessment & Plan Note (Signed)
Antibiotics per above 

## 2012-09-14 NOTE — Addendum Note (Signed)
Addended by: Kandis Fantasia B on: 09/14/2012 02:08 PM   Modules accepted: Orders

## 2012-09-14 NOTE — Patient Instructions (Addendum)
Treating for bronchitis and sinusitis Start antibiotics Start prednisone twice a day  Use inhaler as needed F/U as previous with Dr. Lodema Hong

## 2012-09-14 NOTE — Progress Notes (Signed)
  Subjective:    Patient ID: Daisy Robbins, female    DOB: [birth year], 52 y.o.   MRN: [id number]  HPI  Pt here with cough and congestion, SOB, wheezing for past week. Non smoker, gets bronchitis once a year per report, also has allergies. +nasal congestion, no fever, no CP, using OTC meds not improved  No known lung history  Review of Systems  GEN- denies fatigue, fever, weight loss,weakness, recent illness HEENT- denies eye drainage, change in vision,+ nasal discharge, CVS- denies chest pain, palpitations RESP- + SOB,+ cough,+ wheeze ABD- denies N/V, change in stools, abd pain Neuro- denies headache, dizziness, syncope, seizure activity      Objective:   Physical Exam  GEN- NAD, alert and oriented x3 HEENT- PERRL, EOMI, non injected sclera, pink conjunctiva, MMM, oropharynx clear TM clear bilat no effusion, + maxillary sinus tenderness,  Nasal drainage  Neck- Supple, no LAD CVS- RRR, no murmur RESP-harsh cough, + rhonchi,  bilateral wheeze, norma WOB, decreased BS bases EXT- No edema Pulses- Radial 2+   S/P albuterol- improved Cough and wheeze, good air movement      Assessment & Plan:

## 2012-09-24 ENCOUNTER — Telehealth: Payer: Self-pay | Admitting: Family Medicine

## 2012-09-24 MED ORDER — FLUCONAZOLE 150 MG PO TABS: 150.0000 mg | ORAL_TABLET | Freq: Once | ORAL | Status: AC

## 2012-09-24 NOTE — Telephone Encounter (Signed)
Med sent.

## 2012-11-05 ENCOUNTER — Other Ambulatory Visit: Payer: Self-pay | Admitting: Family Medicine

## 2013-02-14 ENCOUNTER — Telehealth: Payer: Self-pay | Admitting: Family Medicine

## 2013-02-15 ENCOUNTER — Telehealth: Payer: Self-pay | Admitting: Family Medicine

## 2013-02-15 DIAGNOSIS — R5381 Other malaise: Secondary | ICD-10-CM

## 2013-02-15 DIAGNOSIS — Z79899 Other long term (current) drug therapy: Secondary | ICD-10-CM

## 2013-02-15 DIAGNOSIS — I10 Essential (primary) hypertension: Secondary | ICD-10-CM

## 2013-02-15 DIAGNOSIS — E119 Type 2 diabetes mellitus without complications: Secondary | ICD-10-CM

## 2013-02-15 DIAGNOSIS — E669 Obesity, unspecified: Secondary | ICD-10-CM

## 2013-02-15 NOTE — Telephone Encounter (Signed)
Pls send in only 30 day supply of metformin requested. Pt needs to sched and keep an appt within the next 3 weeks She needs to have fasting lipid, hBA1C , cBc , microalb, cmp eGFr, tSH

## 2013-02-18 ENCOUNTER — Other Ambulatory Visit: Payer: Self-pay

## 2013-02-18 MED ORDER — METFORMIN HCL 1000 MG PO TABS: 1000.0000 mg | ORAL_TABLET | Freq: Two times a day (BID) | ORAL | Status: DC

## 2013-02-18 NOTE — Telephone Encounter (Signed)
30 days of med sent and called pt and left message on voicemail to schedule appt within the next 30 days -(none is system) and I was mailing some labwork for her to do 3 days before visit. Mailed today

## 2013-02-18 NOTE — Addendum Note (Signed)
Addended by: Abner Greenspan on: 02/18/2013 03:40 PM   Modules accepted: Orders

## 2013-02-25 LAB — LIPID PANEL
HDL: 76 mg/dL (ref >39)
LDL Cholesterol: 84 mg/dL (ref 0–99)
Total CHOL/HDL Ratio: 2.4 Ratio
Triglycerides: 98 mg/dL (ref <150)

## 2013-02-25 LAB — COMPLETE METABOLIC PANEL WITH GFR
Albumin: 3.8 g/dL (ref 3.5–5.2)
BUN: 8 mg/dL (ref 6–23)
CO2: 31 mEq/L (ref 19–32)
GFR, Est African American: >89 mL/min
GFR, Est Non African American: >89 mL/min
Glucose, Bld: 127 mg/dL — ABNORMAL HIGH (ref 70–99)
Potassium: 4.4 mEq/L (ref 3.5–5.3)
Sodium: 140 mEq/L (ref 135–145)
Total Protein: 7.1 g/dL (ref 6.0–8.3)

## 2013-02-25 LAB — HEMOGLOBIN A1C: Hgb A1c MFr Bld: 6.8 % — ABNORMAL HIGH (ref <5.7)

## 2013-02-25 LAB — CBC WITH DIFFERENTIAL/PLATELET
Basophils Relative: 1 % (ref 0–1)
Eosinophils Absolute: 0.7 10*3/uL (ref 0.0–0.7)
Eosinophils Relative: 9 % — ABNORMAL HIGH (ref 0–5)
Lymphs Abs: 2.1 10*3/uL (ref 0.7–4.0)
MCH: 23.1 pg — ABNORMAL LOW (ref 26.0–34.0)
MCHC: 32.9 g/dL (ref 30.0–36.0)
MCV: 70.2 fL — ABNORMAL LOW (ref 78.0–100.0)
Monocytes Relative: 7 % (ref 3–12)
Neutrophils Relative %: 56 % (ref 43–77)
Platelets: 300 10*3/uL (ref 150–400)
RBC: 5.33 MIL/uL — ABNORMAL HIGH (ref 3.87–5.11)

## 2013-02-25 LAB — TSH: TSH: 0.981 u[IU]/mL (ref 0.350–4.500)

## 2013-02-26 LAB — MICROALBUMIN / CREATININE URINE RATIO
Creatinine, Urine: 165 mg/dL
Microalb Creat Ratio: 3 mg/g (ref 0.0–30.0)
Microalb, Ur: 0.5 mg/dL (ref 0.00–1.89)

## 2013-02-28 ENCOUNTER — Encounter: Payer: Self-pay | Admitting: Family Medicine

## 2013-02-28 ENCOUNTER — Ambulatory Visit (INDEPENDENT_AMBULATORY_CARE_PROVIDER_SITE_OTHER): Payer: 59 | Admitting: Family Medicine

## 2013-02-28 VITALS — BP 132/82 | HR 80 | Resp 16 | Ht 63.0 in | Wt 183.4 lb

## 2013-02-28 DIAGNOSIS — I10 Essential (primary) hypertension: Secondary | ICD-10-CM

## 2013-02-28 DIAGNOSIS — E119 Type 2 diabetes mellitus without complications: Secondary | ICD-10-CM

## 2013-02-28 DIAGNOSIS — H409 Unspecified glaucoma: Secondary | ICD-10-CM

## 2013-02-28 DIAGNOSIS — J309 Allergic rhinitis, unspecified: Secondary | ICD-10-CM

## 2013-02-28 DIAGNOSIS — E669 Obesity, unspecified: Secondary | ICD-10-CM

## 2013-02-28 DIAGNOSIS — Z23 Encounter for immunization: Secondary | ICD-10-CM

## 2013-02-28 MED ORDER — FLUTICASONE PROPIONATE 50 MCG/ACT NA SUSP: 2.0000 | Freq: Every day | NASAL | Status: DC

## 2013-02-28 NOTE — Progress Notes (Signed)
  Subjective:    Patient ID: Daisy Robbins, female    DOB: 03-30-1961, 52 y.o.   MRN: [id number]  HPI The PT is here for follow up and re-evaluation of chronic medical conditions, medication management and review of any available recent lab and radiology data.  Preventive health is updated, specifically  Cancer screening and Immunization.  Elects top postpone pneumonia vaccine today  The PT denies any adverse reactions to current medications since the last visit.  There are no new concerns.States she "eats correctly" but due to lack of regualr exercise is unable to lose the weight she needs to  There are no specific complaints except increased clear nasal drainage and sneezing with season change Denies polyuria, polydipsia or blurred vision, blood sugars are within range when checked      Review of Systems See HPI Denies recent fever or chills. Denies  ear pain or sore throat. Denies chest congestion, productive cough or wheezing. Denies chest pains, palpitations and leg swelling Denies abdominal pain, nausea, vomiting,diarrhea or constipation.   Denies dysuria, frequency, hesitancy or incontinence. Denies joint pain, swelling and limitation in mobility. Denies headaches, seizures, numbness, or tingling. Denies depression, anxiety or insomnia. Denies skin break down or rash.        Objective:   Physical Exam  Patient alert and oriented and in no cardiopulmonary distress.  HEENT: No facial asymmetry, EOMI, no sinus tenderness,  oropharynx pink and moist.  Neck supple no adenopathy.  Chest: Clear to auscultation bilaterally.  CVS: S1, S2 no murmurs, no S3.  ABD: Soft non tender. Bowel sounds normal.  Ext: No edema  MS: Adequate ROM spine, shoulders, hips and knees.  Skin: Intact, no ulcerations or rash noted.  Psych: Good eye contact, normal affect. Memory intact not anxious or depressed appearing.  CNS: CN 2-12 intact, power, tone and sensation normal  throughout.       Assessment & Plan:

## 2013-02-28 NOTE — Patient Instructions (Addendum)
CPE in 4 month, call if you need me before  Flu vaccine today  Excellent cholesterol and blood sugar is improved   It is important that you exercise regularly at least 30 minutes 5 times a week. If you develop chest pain, have severe difficulty breathing, or feel very tired, stop exercising immediately and seek medical attention    .Reduce portions to help with weight loss, weight loss goal is 2.5 pounds per month  STOp sweet tea   No med changes

## 2013-02-28 NOTE — Telephone Encounter (Signed)
Patient has appointment 9.25.2014 at 1:00

## 2013-03-02 NOTE — Assessment & Plan Note (Signed)
Uncontrolled , add flonase, currently using allegra D

## 2013-03-02 NOTE — Assessment & Plan Note (Signed)
Controlled, no change in medication Patient advised to reduce carb and sweets, commit to regular physical activity, take meds as prescribed, test blood as directed, and attempt to lose weight, to improve blood sugar control.  

## 2013-03-02 NOTE — Assessment & Plan Note (Signed)
Unchanged. Patient re-educated about  the importance of commitment to a  minimum of 150 minutes of exercise per week. The importance of healthy food choices with portion control discussed. Encouraged to start a food diary, count calories and to consider  joining a support group. Sample diet sheets offered. Goals set by the patient for the next several months.    

## 2013-03-02 NOTE — Assessment & Plan Note (Signed)
Controlled, no change in medication DASH diet and commitment to daily physical activity for a minimum of 30 minutes discussed and encouraged, as a part of hypertension management. The importance of attaining a healthy weight is also discussed.  

## 2013-03-02 NOTE — Assessment & Plan Note (Addendum)
Followed by opthalmology, reports pressure is controlled

## 2013-03-19 ENCOUNTER — Encounter: Payer: Self-pay | Admitting: Family Medicine

## 2013-03-19 ENCOUNTER — Ambulatory Visit (INDEPENDENT_AMBULATORY_CARE_PROVIDER_SITE_OTHER): Payer: 59 | Admitting: Family Medicine

## 2013-03-19 VITALS — BP 130/78 | HR 92 | Temp 98.5°F | Resp 18 | Ht 63.0 in | Wt 188.1 lb

## 2013-03-19 DIAGNOSIS — J309 Allergic rhinitis, unspecified: Secondary | ICD-10-CM

## 2013-03-19 DIAGNOSIS — J209 Acute bronchitis, unspecified: Secondary | ICD-10-CM

## 2013-03-19 DIAGNOSIS — I10 Essential (primary) hypertension: Secondary | ICD-10-CM

## 2013-03-19 DIAGNOSIS — E669 Obesity, unspecified: Secondary | ICD-10-CM

## 2013-03-19 DIAGNOSIS — E119 Type 2 diabetes mellitus without complications: Secondary | ICD-10-CM

## 2013-03-19 MED ORDER — MONTELUKAST SODIUM 10 MG PO TABS: 10.0000 mg | ORAL_TABLET | Freq: Every day | ORAL | Status: DC

## 2013-03-19 MED ORDER — IPRATROPIUM BROMIDE 0.02 % IN SOLN
0.5000 mg | Freq: Once | RESPIRATORY_TRACT | Status: AC
  Administered 2013-03-19: 0.5 mg via RESPIRATORY_TRACT

## 2013-03-19 MED ORDER — METHYLPREDNISOLONE ACETATE 80 MG/ML IJ SUSP
80.0000 mg | Freq: Once | INTRAMUSCULAR | Status: AC
  Administered 2013-03-19: 80 mg via INTRAMUSCULAR

## 2013-03-19 MED ORDER — PREDNISONE (PAK) 5 MG PO TABS: 5.0000 mg | ORAL_TABLET | ORAL | Status: DC

## 2013-03-19 MED ORDER — ALBUTEROL SULFATE (5 MG/ML) 0.5% IN NEBU
2.5000 mg | INHALATION_SOLUTION | Freq: Once | RESPIRATORY_TRACT | Status: AC
  Administered 2013-03-19: 2.5 mg via RESPIRATORY_TRACT

## 2013-03-19 MED ORDER — ALBUTEROL SULFATE HFA 108 (90 BASE) MCG/ACT IN AERS: 2.0000 | INHALATION_SPRAY | Freq: Four times a day (QID) | RESPIRATORY_TRACT | Status: DC | PRN

## 2013-03-19 MED ORDER — PROMETHAZINE-DM 6.25-15 MG/5ML PO SYRP: ORAL_SOLUTION | ORAL | Status: DC

## 2013-03-19 MED ORDER — AZITHROMYCIN 250 MG PO TABS: ORAL_TABLET | ORAL | Status: AC

## 2013-03-19 NOTE — Patient Instructions (Addendum)
F/u as before  You are being treated for uncontrolled allergies primarily causing congestion in your whole airway, with excessive mucus production  Neb treatment x 1 , in office, depo medrol 80 mg Im in office,   Prescriptions for prednisone dose pack,  singulair, and z pack and albuterol inhaler are sent

## 2013-03-24 NOTE — Assessment & Plan Note (Signed)
Uncontrolled, depo nmedrol and prednisone dose pack

## 2013-03-24 NOTE — Assessment & Plan Note (Signed)
Deteriorated. Patient re-educated about  the importance of commitment to a  minimum of 150 minutes of exercise per week. The importance of healthy food choices with portion control discussed. Encouraged to start a food diary, count calories and to consider  joining a support group. Sample diet sheets offered. Goals set by the patient for the next several months.    

## 2013-03-24 NOTE — Assessment & Plan Note (Signed)
Controlled, no change in medication DASH diet and commitment to daily physical activity for a minimum of 30 minutes discussed and encouraged, as a part of hypertension management. The importance of attaining a healthy weight is also discussed.  

## 2013-03-24 NOTE — Assessment & Plan Note (Signed)
Controlled, no change in medication Patient advised to reduce carb and sweets, commit to regular physical activity, take meds as prescribed, test blood as directed, and attempt to lose weight, to improve blood sugar control.  

## 2013-03-24 NOTE — Assessment & Plan Note (Signed)
Neb treatment and z pack

## 2013-03-24 NOTE — Progress Notes (Signed)
  Subjective:    Patient ID: Daisy Robbins, female    DOB: 1960-08-19, 52 y.o.   MRN: [id number]  HPI 2 week h/o worsening airway obstruction, upper and lower, nasal congestion with difficulty breathing, cough and chest tightness worse at night. Drainage is mainly clear, sputum in chest yellow. No fever , has had chills   Review of Systems See HPI  Denies chest pains, palpitations and leg swelling Denies abdominal pain, nausea, vomiting,diarrhea or constipation.   Denies dysuria, frequency, hesitancy or incontinence. Denies joint pain, swelling and limitation in mobility. Denies headaches, seizures, numbness, or tingling. Denies depression, anxiety or insomnia. Denies skin break down or rash.        Objective:   Physical Exam  Patient alert and oriented and in no cardiopulmonary distress.  HEENT: No facial asymmetry, EOMI, no sinus tenderness,  oropharynx pink and moist.  Neck supple no adenopathy.Nasal mucosa erythematous and edematous  Chest:decreased thoiugh adequate air entry,bilateral wheeze few crackles   CVS: S1, S2 no murmurs, no S3.  ABD: Soft non tender. Bowel sounds normal.  Ext: No edema  MS: Adequate ROM spine, shoulders, hips and knees.  Skin: Intact, no ulcerations or rash noted.  Psych: Good eye contact, normal affect. Memory intact not anxious or depressed appearing.  CNS: CN 2-12 intact, power, tone and sensation normal throughout.       Assessment & Plan:

## 2013-05-10 ENCOUNTER — Other Ambulatory Visit: Payer: Self-pay | Admitting: Family Medicine

## 2013-05-22 ENCOUNTER — Telehealth: Payer: Self-pay | Admitting: Family Medicine

## 2013-05-22 ENCOUNTER — Telehealth: Payer: Self-pay

## 2013-05-22 MED ORDER — PROMETHAZINE-DM 6.25-15 MG/5ML PO SYRP: 5.0000 mL | ORAL_SOLUTION | Freq: Every evening | ORAL | Status: DC | PRN

## 2013-05-22 NOTE — Telephone Encounter (Signed)
See previous message

## 2013-05-22 NOTE — Addendum Note (Signed)
Addended by: Kandis Fantasia B on: 05/22/2013 05:21 PM   Modules accepted: Orders

## 2013-05-22 NOTE — Telephone Encounter (Signed)
Please advise 

## 2013-05-22 NOTE — Telephone Encounter (Signed)
Patient is aware the medicine has been called in and tell Dr. Lodema Hong she loves her Dr.

## 2013-05-22 NOTE — Telephone Encounter (Signed)
pls send in phenergan DM  5 cc at bedtime as needed for cough x 240 ml refill zero and  Let her know

## 2013-05-22 NOTE — Telephone Encounter (Signed)
Med sent and patient made aware. 

## 2013-07-02 ENCOUNTER — Encounter: Payer: 59 | Admitting: Family Medicine

## 2013-07-29 ENCOUNTER — Telehealth: Payer: Self-pay | Admitting: Family Medicine

## 2013-07-29 ENCOUNTER — Other Ambulatory Visit: Payer: Self-pay

## 2013-07-29 DIAGNOSIS — J309 Allergic rhinitis, unspecified: Secondary | ICD-10-CM

## 2013-07-29 MED ORDER — BENAZEPRIL-HYDROCHLOROTHIAZIDE 20-12.5 MG PO TABS: ORAL_TABLET | ORAL | Status: DC

## 2013-07-29 MED ORDER — MONTELUKAST SODIUM 10 MG PO TABS: 10.0000 mg | ORAL_TABLET | Freq: Every day | ORAL | Status: DC

## 2013-07-29 MED ORDER — FLUTICASONE PROPIONATE 50 MCG/ACT NA SUSP: 2.0000 | Freq: Every day | NASAL | Status: DC

## 2013-07-29 MED ORDER — METFORMIN HCL 1000 MG PO TABS: 1000.0000 mg | ORAL_TABLET | Freq: Two times a day (BID) | ORAL | Status: DC

## 2013-07-29 NOTE — Telephone Encounter (Signed)
meds printed off and given to patient in office

## 2013-09-13 ENCOUNTER — Telehealth: Payer: Self-pay | Admitting: Family Medicine

## 2013-09-13 MED ORDER — BENAZEPRIL-HYDROCHLOROTHIAZIDE 20-12.5 MG PO TABS: ORAL_TABLET | ORAL | Status: DC

## 2013-09-13 MED ORDER — METFORMIN HCL 1000 MG PO TABS: 1000.0000 mg | ORAL_TABLET | Freq: Two times a day (BID) | ORAL | Status: DC

## 2013-09-13 NOTE — Telephone Encounter (Signed)
Refilled enough to last until her appt

## 2013-09-22 ENCOUNTER — Other Ambulatory Visit: Payer: Self-pay | Admitting: Family Medicine

## 2013-10-01 ENCOUNTER — Encounter (INDEPENDENT_AMBULATORY_CARE_PROVIDER_SITE_OTHER): Payer: Self-pay

## 2013-10-01 ENCOUNTER — Other Ambulatory Visit: Payer: Self-pay | Admitting: Family Medicine

## 2013-10-01 ENCOUNTER — Encounter: Payer: Self-pay | Admitting: Family Medicine

## 2013-10-01 ENCOUNTER — Ambulatory Visit (INDEPENDENT_AMBULATORY_CARE_PROVIDER_SITE_OTHER): Payer: 59 | Admitting: Family Medicine

## 2013-10-01 VITALS — BP 144/84 | HR 74 | Resp 18 | Wt 185.0 lb

## 2013-10-01 DIAGNOSIS — E119 Type 2 diabetes mellitus without complications: Secondary | ICD-10-CM

## 2013-10-01 DIAGNOSIS — J309 Allergic rhinitis, unspecified: Secondary | ICD-10-CM

## 2013-10-01 DIAGNOSIS — Z1231 Encounter for screening mammogram for malignant neoplasm of breast: Secondary | ICD-10-CM

## 2013-10-01 DIAGNOSIS — I10 Essential (primary) hypertension: Secondary | ICD-10-CM

## 2013-10-01 DIAGNOSIS — E669 Obesity, unspecified: Secondary | ICD-10-CM

## 2013-10-01 MED ORDER — BENAZEPRIL-HYDROCHLOROTHIAZIDE 20-12.5 MG PO TABS: ORAL_TABLET | ORAL | Status: DC

## 2013-10-01 NOTE — Patient Instructions (Signed)
Pelvic and breast exam last week in May  Labs today HBA1C chem 7 and EGFr   Increase benazepril to one and a half daily, blood pressure is high  You are referred for mammogram and eye exam last week in May , Mon through Wednesday

## 2013-10-10 ENCOUNTER — Telehealth: Payer: Self-pay | Admitting: Family Medicine

## 2013-10-10 MED ORDER — BENZONATATE 100 MG PO CAPS: 100.0000 mg | ORAL_CAPSULE | Freq: Two times a day (BID) | ORAL | Status: DC | PRN

## 2013-10-10 MED ORDER — PROMETHAZINE-DM 6.25-15 MG/5ML PO SYRP: 5.0000 mL | ORAL_SOLUTION | Freq: Every evening | ORAL | Status: DC | PRN

## 2013-10-10 NOTE — Telephone Encounter (Signed)
Please advise 

## 2013-10-10 NOTE — Telephone Encounter (Signed)
Med sent to pharmacy.  Patient aware.  

## 2013-10-10 NOTE — Addendum Note (Signed)
Addended by: Denman George B on: 10/10/2013 03:57 PM   Modules accepted: Orders

## 2013-10-10 NOTE — Telephone Encounter (Signed)
pls erx tessalon perle 100mg  twice daily as needed #20 no refill also phenergan dm 5 cc at bedtime as needed (lowest volume available, I think 112 ml) I see no documentation of fever , chills etc, and if that is the case will need Ov which will not be available before next week

## 2013-10-28 NOTE — Progress Notes (Signed)
   Subjective:    Patient ID: Daisy Robbins, female    DOB: December 18, 1960, 53 y.o.   MRN: [id number]  HPI The PT is here for follow up and re-evaluation of chronic medical conditions, medication management and review of any available recent lab and radiology data.  Preventive health is updated, specifically  Cancer screening and Immunization.  Needs mammogram and complete exam Questions or concerns regarding consultations or procedures which the PT has had in the interim are  addressed. The PT denies any adverse reactions to current medications since the last visit.  There are no new concerns.  There are no specific complaints , does not test blood sugar regularly, but feels that it is well controlled, asymptomatic for polyuria , polydipsia or hypoglycemic episodes      Review of Systems See HPI Denies recent fever or chills. Denies sinus pressure, , ear pain or sore throat.increased clear nasal drainage, and excessive sneezing and watery eyes with season change Denies chest congestion, productive cough or wheezing. Denies chest pains, palpitations and leg swelling Denies abdominal pain, nausea, vomiting,diarrhea or constipation.   Denies dysuria, frequency, hesitancy or incontinence. Denies joint pain, swelling and limitation in mobility. Denies headaches, seizures, numbness, or tingling. Denies depression, anxiety or insomnia. Denies skin break down or rash.        Objective:   Physical Exam BP 144/84  Pulse 74  Resp 18  Wt 185 lb (83.915 kg)  SpO2 98% Patient alert and oriented and in no cardiopulmonary distress.  HEENT: No facial asymmetry, EOMI, no sinus tenderness,  oropharynx pink and moist.  Neck supple no adenopathy.Bilateral conjunctival injection, clear drainage, nasal mucosa erythematous and edematous  Chest: Clear to auscultation bilaterally.  CVS: S1, S2 no murmurs, no S3.  ABD: Soft non tender. Bowel sounds normal.  Ext: No edema  MS: Adequate ROM  spine, shoulders, hips and knees.  Skin: Intact, no ulcerations or rash noted.  Psych: Good eye contact, normal affect. Memory intact not anxious or depressed appearing.  CNS: CN 2-12 intact, power, tone and sensation normal throughout.        Assessment & Plan:  ESSENTIAL HYPERTENSION, BENIGN UnControlled, increase in dose of medication DASH diet and commitment to daily physical activity for a minimum of 30 minutes discussed and encouraged, as a part of hypertension management. The importance of attaining a healthy weight is also discussed.   DIABETES MELLITUS, TYPE II, WITHOUT COMPLICATIONS Updated lab needed at/ before next visit. Patient advised to reduce carb and sweets, commit to regular physical activity, take meds as prescribed, test blood as directed, and attempt to lose weight, to improve blood sugar control.   OBESITY, UNSPECIFIED Improved. Pt applauded on succesful weight loss through lifestyle change, and encouraged to continue same. Weight loss goal set for the next several months.   RHINOSINUSITIS, ALLERGIC Increased symptoms, and uncontrolled with season change, use of daily medication is stressed

## 2013-10-28 NOTE — Assessment & Plan Note (Signed)
Improved. Pt applauded on succesful weight loss through lifestyle change, and encouraged to continue same. Weight loss goal set for the next several months.  

## 2013-10-28 NOTE — Assessment & Plan Note (Addendum)
UnControlled, increase in dose of medication DASH diet and commitment to daily physical activity for a minimum of 30 minutes discussed and encouraged, as a part of hypertension management. The importance of attaining a healthy weight is also discussed.

## 2013-10-28 NOTE — Assessment & Plan Note (Signed)
Increased symptoms, and uncontrolled with season change, use of daily medication is stressed

## 2013-10-28 NOTE — Assessment & Plan Note (Signed)
Updated lab needed at/ before next visit. Patient advised to reduce carb and sweets, commit to regular physical activity, take meds as prescribed, test blood as directed, and attempt to lose weight, to improve blood sugar control.  

## 2013-10-29 ENCOUNTER — Ambulatory Visit (HOSPITAL_COMMUNITY)
Admission: RE | Admit: 2013-10-29 | Discharge: 2013-10-29 | Disposition: A | Discharge status: Home / Self Care | Payer: 59 | Source: Ambulatory Visit | Attending: Family Medicine | Admitting: Family Medicine

## 2013-10-29 DIAGNOSIS — Z1231 Encounter for screening mammogram for malignant neoplasm of breast: Secondary | ICD-10-CM

## 2013-10-30 ENCOUNTER — Other Ambulatory Visit (HOSPITAL_COMMUNITY)
Admission: RE | Admit: 2013-10-30 | Discharge: 2013-10-30 | Disposition: A | Discharge status: Home / Self Care | Payer: 59 | Source: Ambulatory Visit | Attending: Family Medicine | Admitting: Family Medicine

## 2013-10-30 ENCOUNTER — Ambulatory Visit (INDEPENDENT_AMBULATORY_CARE_PROVIDER_SITE_OTHER): Payer: 59 | Admitting: Family Medicine

## 2013-10-30 ENCOUNTER — Encounter: Payer: Self-pay | Admitting: Family Medicine

## 2013-10-30 VITALS — BP 132/82 | HR 80 | Resp 16 | Wt 180.8 lb

## 2013-10-30 DIAGNOSIS — Z124 Encounter for screening for malignant neoplasm of cervix: Secondary | ICD-10-CM

## 2013-10-30 DIAGNOSIS — Z1272 Encounter for screening for malignant neoplasm of vagina: Secondary | ICD-10-CM

## 2013-10-30 DIAGNOSIS — E119 Type 2 diabetes mellitus without complications: Secondary | ICD-10-CM

## 2013-10-30 DIAGNOSIS — Z1151 Encounter for screening for human papillomavirus (HPV): Secondary | ICD-10-CM | POA: Insufficient documentation

## 2013-10-30 DIAGNOSIS — R5381 Other malaise: Secondary | ICD-10-CM

## 2013-10-30 DIAGNOSIS — Z01419 Encounter for gynecological examination (general) (routine) without abnormal findings: Secondary | ICD-10-CM | POA: Insufficient documentation

## 2013-10-30 DIAGNOSIS — Z79899 Other long term (current) drug therapy: Secondary | ICD-10-CM

## 2013-10-30 DIAGNOSIS — I10 Essential (primary) hypertension: Secondary | ICD-10-CM

## 2013-10-30 DIAGNOSIS — Z23 Encounter for immunization: Secondary | ICD-10-CM

## 2013-10-30 DIAGNOSIS — Z139 Encounter for screening, unspecified: Secondary | ICD-10-CM

## 2013-10-30 DIAGNOSIS — Z1211 Encounter for screening for malignant neoplasm of colon: Secondary | ICD-10-CM

## 2013-10-30 DIAGNOSIS — Z0000 Encounter for general adult medical examination without abnormal findings: Secondary | ICD-10-CM

## 2013-10-30 DIAGNOSIS — R5383 Other fatigue: Secondary | ICD-10-CM

## 2013-10-30 LAB — HM DIABETES EYE EXAM

## 2013-10-30 LAB — POC HEMOCCULT BLD/STL (OFFICE/1-CARD/DIAGNOSTIC): Fecal Occult Blood, POC: NEGATIVE

## 2013-10-30 NOTE — Patient Instructions (Addendum)
F/u mid October, call if you need me before  Pneumonia vaccine today. Congrats on change in diet with weight loss and improved BP  Continue to follow through on eye exams, this is very important  Microalb, fasting lipid, cmp and EGFr, HBA1C TSH and vit D in October  It is important that you exercise regularly at least 30 minutes 5 times a week. If you develop chest pain, have severe difficulty breathing, or feel very tired, stop exercising immediately and seek medical attention   Weight loss goal of 2.5 pounds per month

## 2013-12-03 ENCOUNTER — Telehealth: Payer: Self-pay | Admitting: Family Medicine

## 2013-12-03 NOTE — Telephone Encounter (Signed)
Patient called requesting an appointment. She states she has congestion, cough, and some tightness in the chest. Please Advise. Patient phone is [phone]. She also stated if need be you could leave a message on this phone because she is at work.

## 2013-12-08 ENCOUNTER — Telehealth: Payer: Self-pay | Admitting: Family Medicine

## 2013-12-08 DIAGNOSIS — E119 Type 2 diabetes mellitus without complications: Secondary | ICD-10-CM

## 2013-12-08 NOTE — Telephone Encounter (Signed)
Most recent HBa1c for pt is in 02/2013, cannot locate any more recent, she needs one if none since then ,alaso chem 7 and EGFR please If she has recently had one pls locate, thank you

## 2013-12-08 NOTE — Assessment & Plan Note (Signed)
Annual exam as documented. Counseling done  re healthy lifestyle involving commitment to 150 minutes exercise per week, heart healthy diet, and attaining healthy weight.The importance of adequate sleep also discussed. Regular seat belt use  is also discussed. Changes in health habits are decided on by the patient with goals and time frames  set for achieving them. Immunization and cancer screening needs are specifically addressed at this visit.

## 2013-12-08 NOTE — Progress Notes (Signed)
   Subjective:    Patient ID: Daisy Robbins, female    DOB: [birth year], 53 y.o.   MRN: [id number]  HPI Patient is in for annual physical exam. No other health concerns are expressed or addressed at the visit.    Review of Systems See HPI     Objective:   Physical Exam BP 132/82  Pulse 80  Resp 16  Wt 180 lb 12.8 oz (82.01 kg)  SpO2 96% Pleasant well nourished female, alert and oriented x 3, in no cardio-pulmonary distress. Afebrile. HEENT No facial trauma or asymetry. Sinuses non tender.  EOMI, PERTL, fundoscopic exam  no hemorhage or exudate.  External ears normal, tympanic membranes clear. Oropharynx moist, no exudate,fairly  good dentition. Neck: supple, no adenopathy,JVD or thyromegaly.No bruits.  Chest: Clear to ascultation bilaterally.No crackles or wheezes. Non tender to palpation  Breast: No asymetry,no masses or lumps. No tenderness. No nipple discharge or inversion. No axillary or supraclavicular adenopathy  Cardiovascular system; Heart sounds normal,  S1 and  S2 ,no S3.  No murmur, or thrill.Regular rate Apical beat not displaced Peripheral pulses normal.  Abdomen: Soft, non tender, no organomegaly or masses. No bruits. Bowel sounds normal. No guarding, tenderness or rebound.  Rectal:  Normal sphincter tone. No mass.No rectal masses.  Guaiac negative stool.  GU: External genitalia normal female genitalia , female distribution of hair. No lesions. Urethral meatus normal in size, no  Prolapse, no lesions visibly  Present. Bladder non tender. Vagina pink and moist , with no visible lesions ,physiologic  discharge present . Adequate pelvic support no  cystocele or rectocele noted Uterus absent, no adnexal masses, no cervical motion or adnexal tenderness.   Musculoskeletal exam: Full ROM of spine, hips , shoulders and knees. No deformity ,swelling or crepitus noted. No muscle wasting or atrophy.   Neurologic: Cranial nerves 2 to 12  intact. Power, tone ,sensation and reflexes normal throughout. No disturbance in gait. No tremor.  Skin: Intact, no ulceration, erythema , scaling or rash noted. Pigmentation normal throughout  Psych; Normal mood and affect. Judgement and concentration normal        Assessment & Plan:  Routine general medical examination at a health care facility Annual exam as documented. Counseling done  re healthy lifestyle involving commitment to 150 minutes exercise per week, heart healthy diet, and attaining healthy weight.The importance of adequate sleep also discussed. Regular seat belt use  is also discussed. Changes in health habits are decided on by the patient with goals and time frames  set for achieving them. Immunization and cancer screening needs are specifically addressed at this visit.

## 2013-12-12 NOTE — Telephone Encounter (Signed)
Called and left message for patient to return call.  

## 2013-12-13 ENCOUNTER — Other Ambulatory Visit: Payer: Self-pay

## 2013-12-13 MED ORDER — PROMETHAZINE-DM 6.25-15 MG/5ML PO SYRP
5.0000 mL | ORAL_SOLUTION | Freq: Every evening | ORAL | Status: DC | PRN
Start: 2013-12-13 — End: 2014-01-20

## 2013-12-13 NOTE — Telephone Encounter (Signed)
Patient states that cough and congestion worsen at night.  Would like to know if she can have refill on phenergan dm cough syrup.

## 2013-12-13 NOTE — Telephone Encounter (Signed)
Noted.  Patient states that she only has thick clear sputum.  No bacterial infection symptoms.

## 2013-12-13 NOTE — Telephone Encounter (Signed)
Patient aware of need for labs and will have drawn.

## 2013-12-13 NOTE — Telephone Encounter (Signed)
pls refill x 1 , ensure no symptoms of bacterial infection pls

## 2013-12-13 NOTE — Addendum Note (Signed)
Addended by: Denman George B on: 12/13/2013 10:46 AM   Modules accepted: Orders

## 2014-01-13 ENCOUNTER — Other Ambulatory Visit: Payer: Self-pay | Admitting: Family Medicine

## 2014-01-20 ENCOUNTER — Ambulatory Visit (INDEPENDENT_AMBULATORY_CARE_PROVIDER_SITE_OTHER): Payer: BC Managed Care – PPO | Admitting: Family Medicine

## 2014-01-20 ENCOUNTER — Encounter (INDEPENDENT_AMBULATORY_CARE_PROVIDER_SITE_OTHER): Payer: Self-pay

## 2014-01-20 ENCOUNTER — Encounter: Payer: Self-pay | Admitting: Family Medicine

## 2014-01-20 VITALS — BP 148/82 | HR 66 | Temp 98.6°F | Resp 18 | Ht 63.0 in | Wt 181.0 lb

## 2014-01-20 DIAGNOSIS — I10 Essential (primary) hypertension: Secondary | ICD-10-CM

## 2014-01-20 DIAGNOSIS — J309 Allergic rhinitis, unspecified: Secondary | ICD-10-CM

## 2014-01-20 DIAGNOSIS — E785 Hyperlipidemia, unspecified: Secondary | ICD-10-CM

## 2014-01-20 DIAGNOSIS — E669 Obesity, unspecified: Secondary | ICD-10-CM

## 2014-01-20 DIAGNOSIS — Z13220 Encounter for screening for lipoid disorders: Secondary | ICD-10-CM

## 2014-01-20 DIAGNOSIS — E119 Type 2 diabetes mellitus without complications: Secondary | ICD-10-CM

## 2014-01-20 DIAGNOSIS — R062 Wheezing: Secondary | ICD-10-CM

## 2014-01-20 MED ORDER — MONTELUKAST SODIUM 10 MG PO TABS: 10.0000 mg | ORAL_TABLET | Freq: Every day | ORAL | Status: DC

## 2014-01-20 MED ORDER — PREDNISONE (PAK) 5 MG PO TABS: 5.0000 mg | ORAL_TABLET | ORAL | Status: DC

## 2014-01-20 MED ORDER — IPRATROPIUM BROMIDE 0.02 % IN SOLN
0.5000 mg | Freq: Once | RESPIRATORY_TRACT | Status: AC
  Administered 2014-01-20: 0.5 mg via RESPIRATORY_TRACT

## 2014-01-20 MED ORDER — ALBUTEROL SULFATE (2.5 MG/3ML) 0.083% IN NEBU
2.5000 mg | INHALATION_SOLUTION | Freq: Once | RESPIRATORY_TRACT | Status: AC
Start: 2014-01-20 — End: 2014-01-20
  Administered 2014-01-20: 2.5 mg via RESPIRATORY_TRACT

## 2014-01-20 MED ORDER — METHYLPREDNISOLONE ACETATE 80 MG/ML IJ SUSP
80.0000 mg | Freq: Once | INTRAMUSCULAR | Status: AC
  Administered 2014-01-20: 80 mg via INTRAMUSCULAR

## 2014-01-20 MED ORDER — ALBUTEROL SULFATE HFA 108 (90 BASE) MCG/ACT IN AERS: 2.0000 | INHALATION_SPRAY | Freq: Four times a day (QID) | RESPIRATORY_TRACT | Status: DC | PRN

## 2014-01-20 NOTE — Patient Instructions (Addendum)
F/u early December,  Cancel mid October follow up  Breatthing treatment and depo medrol in  office today, also prednisne prescribed for 6 days to help with uncontrolled allergies and wheezing  HBA1C, chem 7 and EGFR today.  Blood pressure higher number is still too high.  I recommend increasing your tablets to TWO daily instead of one and a half, since we have discussed and you do not want to do this now, you DO need to commit to daily exercise , weight loss and eating mainly fresh/frozen vegetables    Fasting lipid, cmp and EGFR, hBA1C, cBC, tSH and microalb in December, before visit

## 2014-01-22 ENCOUNTER — Other Ambulatory Visit: Payer: Self-pay | Admitting: Family Medicine

## 2014-01-23 LAB — MICROALBUMIN / CREATININE URINE RATIO
Creatinine, Urine: 60.4 mg/dL
MICROALB/CREAT RATIO: 8.3 mg/g (ref 0.0–30.0)
Microalb, Ur: 0.5 mg/dL (ref 0.00–1.89)

## 2014-01-23 LAB — COMPLETE METABOLIC PANEL WITH GFR
ALK PHOS: 102 U/L (ref 39–117)
ALT: 21 U/L (ref 0–35)
AST: 22 U/L (ref 0–37)
Albumin: 4.5 g/dL (ref 3.5–5.2)
BUN: 16 mg/dL (ref 6–23)
CALCIUM: 9.9 mg/dL (ref 8.4–10.5)
CO2: 32 mEq/L (ref 19–32)
Chloride: 96 mEq/L (ref 96–112)
Creat: 0.65 mg/dL (ref 0.50–1.10)
GFR, Est African American: >89 mL/min
GFR, Est Non African American: >89 mL/min
GLUCOSE: 179 mg/dL — AB (ref 70–99)
POTASSIUM: 3.9 meq/L (ref 3.5–5.3)
SODIUM: 137 meq/L (ref 135–145)
TOTAL PROTEIN: 7.9 g/dL (ref 6.0–8.3)
Total Bilirubin: 0.3 mg/dL (ref 0.2–1.2)

## 2014-01-23 LAB — LIPID PANEL
CHOL/HDL RATIO: 2.1 ratio
Cholesterol: 201 mg/dL — ABNORMAL HIGH (ref 0–200)
HDL: 95 mg/dL (ref >39)
LDL Cholesterol: 91 mg/dL (ref 0–99)
Triglycerides: 74 mg/dL (ref <150)
VLDL: 15 mg/dL (ref 0–40)

## 2014-01-23 LAB — TSH: TSH: 0.318 u[IU]/mL — ABNORMAL LOW (ref 0.350–4.500)

## 2014-01-23 LAB — CBC
HEMATOCRIT: 38.7 % (ref 36.0–46.0)
Hemoglobin: 12.9 g/dL (ref 12.0–15.0)
MCH: 23.5 pg — AB (ref 26.0–34.0)
MCHC: 33.3 g/dL (ref 30.0–36.0)
MCV: 70.6 fL — AB (ref 78.0–100.0)
Platelets: 277 10*3/uL (ref 150–400)
RBC: 5.48 MIL/uL — ABNORMAL HIGH (ref 3.87–5.11)
RDW: 16.6 % — ABNORMAL HIGH (ref 11.5–15.5)
WBC: 10.2 10*3/uL (ref 4.0–10.5)

## 2014-01-23 LAB — T4, FREE: Free T4: 1.18 ng/dL (ref 0.80–1.80)

## 2014-01-23 LAB — HEMOGLOBIN A1C
Hgb A1c MFr Bld: 7.4 % — ABNORMAL HIGH (ref <5.7)
Mean Plasma Glucose: 166 mg/dL — ABNORMAL HIGH (ref <117)

## 2014-01-23 LAB — T3, FREE: T3 FREE: 2.6 pg/mL (ref 2.3–4.2)

## 2014-01-26 DIAGNOSIS — E785 Hyperlipidemia, unspecified: Secondary | ICD-10-CM | POA: Insufficient documentation

## 2014-01-26 NOTE — Assessment & Plan Note (Signed)
Uncontrolled DASH diet and commitment to daily physical activity for a minimum of 30 minutes discussed and encouraged, as a part of hypertension management. The importance of attaining a healthy weight is also discussed. Pt resists increased med dose advised

## 2014-01-26 NOTE — Assessment & Plan Note (Signed)
Uncontrolled, steroid in office and prescribed short term. Pt ran out of her singulair and this is refilled

## 2014-01-26 NOTE — Progress Notes (Signed)
   Subjective:    Patient ID: Daisy Robbins, female    DOB: [birth year], 53 y.o.   MRN: [id number]  HPI 1 week h/o increased cough, wheeze, nasal drainage is increased and is clear. Sputum is clear and she is wheezing, no fever or chills. Out of singulair since symptoms started Chronic health conditions are also reviewed at visit, as is her immunization and cancer screening   Review of Systems See HPI  Denies chest pains, palpitations and leg swelling Denies abdominal pain, nausea, vomiting,diarrhea or constipation.   Denies dysuria, frequency, hesitancy or incontinence. Denies joint pain, swelling and limitation in mobility. Denies headaches, seizures, numbness, or tingling. Denies depression, anxiety or insomnia. Denies skin break down or rash.        Objective:   Physical Exam  BP 148/82  Pulse 66  Temp(Src) 98.6 F (37 C)  Resp 18  Ht 5\' 3"  (1.6 m)  Wt 181 lb (82.101 kg)  BMI 32.07 kg/m2  SpO2 97% Patient alert and oriented and in no cardiopulmonary distress.  HEENT: No facial asymmetry, EOMI,   oropharynx pink and moist.  Neck supple no JVD, no mass.  Chest: decreased air entry , bilateral wheezing, no crackles.  CVS: S1, S2 no murmurs, no S3.Regular rate.  ABD: Soft non tender.   Ext: No edema  MS: Adequate ROM spine, shoulders, hips and knees.  Skin: Intact, no ulcerations or rash noted.  Psych: Good eye contact, normal affect. Memory intact not anxious or depressed appearing.  CNS: CN 2-12 intact, power,  normal throughout.no focal deficits noted.       Assessment & Plan:  RHINOSINUSITIS, ALLERGIC Uncontrolled, steroid in office and prescribed short term. Pt ran out of her singulair and this is refilled  Wheezing Uncontrolled, neb treatment in office and steroids  OBESITY, UNSPECIFIED Unchanged Patient re-educated about  the importance of commitment to a  minimum of 150 minutes of exercise per week. The importance of healthy food choices  with portion control discussed. Encouraged to start a food diary, count calories and to consider  joining a support group. Sample diet sheets offered. Goals set by the patient for the next several months.     DIABETES MELLITUS, TYPE II, WITHOUT COMPLICATIONS Deteriorated. Patient advised to reduce carb and sweets, commit to regular physical activity, take meds as prescribed, test blood as directed, and attempt to lose weight, to improve blood sugar control.   ESSENTIAL HYPERTENSION, BENIGN Uncontrolled DASH diet and commitment to daily physical activity for a minimum of 30 minutes discussed and encouraged, as a part of hypertension management. The importance of attaining a healthy weight is also discussed. Pt resists increased med dose advised  Hyperlipidemia LDL goal <100 elevated total cholesterol Hyperlipidemia:Low fat diet discussed and encouraged.  Refuses statin therapy

## 2014-01-26 NOTE — Assessment & Plan Note (Signed)
Unchanged. Patient re-educated about  the importance of commitment to a  minimum of 150 minutes of exercise per week. The importance of healthy food choices with portion control discussed. Encouraged to start a food diary, count calories and to consider  joining a support group. Sample diet sheets offered. Goals set by the patient for the next several months.    

## 2014-01-26 NOTE — Assessment & Plan Note (Signed)
Deteriorated. Patient advised to reduce carb and sweets, commit to regular physical activity, take meds as prescribed, test blood as directed, and attempt to lose weight, to improve blood sugar control.

## 2014-01-26 NOTE — Assessment & Plan Note (Signed)
Uncontrolled, neb treatment in office and steroids

## 2014-01-26 NOTE — Assessment & Plan Note (Signed)
elevated total cholesterol Hyperlipidemia:Low fat diet discussed and encouraged.  Refuses statin therapy

## 2014-03-12 ENCOUNTER — Telehealth: Payer: Self-pay | Admitting: Family Medicine

## 2014-03-12 ENCOUNTER — Other Ambulatory Visit: Payer: Self-pay | Admitting: Family Medicine

## 2014-03-12 ENCOUNTER — Other Ambulatory Visit: Payer: Self-pay

## 2014-03-12 MED ORDER — BENAZEPRIL-HYDROCHLOROTHIAZIDE 20-12.5 MG PO TABS: ORAL_TABLET | ORAL | Status: DC

## 2014-03-12 MED ORDER — METFORMIN HCL 1000 MG PO TABS: 1000.0000 mg | ORAL_TABLET | Freq: Two times a day (BID) | ORAL | Status: DC

## 2014-03-12 NOTE — Telephone Encounter (Signed)
Refill sent in

## 2014-03-19 ENCOUNTER — Ambulatory Visit: Payer: 59 | Admitting: Family Medicine

## 2014-04-18 ENCOUNTER — Other Ambulatory Visit: Payer: Self-pay

## 2014-04-18 ENCOUNTER — Telehealth: Payer: Self-pay

## 2014-04-18 MED ORDER — PROMETHAZINE-DM 6.25-15 MG/5ML PO SYRP: 5.0000 mL | ORAL_SOLUTION | Freq: Every evening | ORAL | Status: DC | PRN

## 2014-04-18 NOTE — Telephone Encounter (Signed)
Yes pls refill x 1, and let her know

## 2014-04-18 NOTE — Telephone Encounter (Signed)
Med refilled.

## 2014-04-18 NOTE — Telephone Encounter (Signed)
States she had a cold last week and she is better but still has the cough, especially bad at night. Mainly dry cough but sometimes some clear phlegm. Has been using robitussin with no relief. Can her promethazine DM be refilled to [facility]?

## 2014-05-27 ENCOUNTER — Encounter: Payer: Self-pay | Admitting: Family Medicine

## 2014-05-27 ENCOUNTER — Ambulatory Visit: Payer: BC Managed Care – PPO | Admitting: Family Medicine

## 2014-06-30 ENCOUNTER — Ambulatory Visit (INDEPENDENT_AMBULATORY_CARE_PROVIDER_SITE_OTHER): Payer: BLUE CROSS/BLUE SHIELD | Admitting: Family Medicine

## 2014-06-30 ENCOUNTER — Encounter: Payer: Self-pay | Admitting: Family Medicine

## 2014-06-30 VITALS — BP 128/70 | HR 70 | Resp 18 | Ht 63.0 in | Wt 181.0 lb

## 2014-06-30 DIAGNOSIS — I10 Essential (primary) hypertension: Secondary | ICD-10-CM

## 2014-06-30 DIAGNOSIS — J3089 Other allergic rhinitis: Secondary | ICD-10-CM

## 2014-06-30 DIAGNOSIS — Z23 Encounter for immunization: Secondary | ICD-10-CM

## 2014-06-30 DIAGNOSIS — R062 Wheezing: Secondary | ICD-10-CM

## 2014-06-30 DIAGNOSIS — E669 Obesity, unspecified: Secondary | ICD-10-CM

## 2014-06-30 DIAGNOSIS — E119 Type 2 diabetes mellitus without complications: Secondary | ICD-10-CM

## 2014-06-30 MED ORDER — MOMETASONE FUROATE 50 MCG/ACT NA SUSP: 2.0000 | Freq: Every day | NASAL | Status: DC

## 2014-06-30 MED ORDER — PREDNISONE 5 MG PO TABS: 5.0000 mg | ORAL_TABLET | Freq: Two times a day (BID) | ORAL | Status: DC

## 2014-06-30 NOTE — Assessment & Plan Note (Signed)
Controlled, no change in medication DASH diet and commitment to daily physical activity for a minimum of 30 minutes discussed and encouraged, as a part of hypertension management. The importance of attaining a healthy weight is also discussed.  

## 2014-06-30 NOTE — Assessment & Plan Note (Signed)
Vaccine administered at visit.  

## 2014-06-30 NOTE — Progress Notes (Signed)
   Subjective:    Patient ID: Daisy Robbins, female    DOB: 02/14/1961, 54 y.o.   MRN: [id number]  HPI The PT is here for follow up and re-evaluation of chronic medical conditions, medication management and review of any available recent lab and radiology data.  Preventive health is updated, specifically  Cancer screening and Immunization.   Questions or concerns regarding consultations or procedures which the PT has had in the interim are  addressed. The PT denies any adverse reactions to current medications since the last visit.  There are no new concerns.  There are no specific complaints       Review of Systems See HPI Denies recent fever or chills. Denies sinus pressure, c/o increased  nasal congestion, clear drainage, denies  ear pain or sore throat. Denies chest congestion, productive cough or wheezing. Denies chest pains, palpitations and leg swelling Denies abdominal pain, nausea, vomiting,diarrhea or constipation.   Denies dysuria, frequency, hesitancy or incontinence. Denies joint pain, swelling and limitation in mobility. Denies headaches, seizures, numbness, or tingling. Denies depression, anxiety or insomnia. Denies skin break down or rash.        Objective:   Physical Exam BP 128/70 mmHg  Pulse 70  Resp 18  Ht 5\' 3"  (1.6 m)  Wt 181 lb (82.101 kg)  BMI 32.07 kg/m2  SpO2 97% Patient alert and oriented and in no cardiopulmonary distress.  HEENT: No facial asymmetry, EOMI,   oropharynx pink and moist.  Neck supple no JVD, no mass. nasal mucosa erythematous and edematous, speech is nasal, sinuses non tender, TM clear Chest: Clear to auscultation bilaterally.  CVS: S1, S2 no murmurs, no S3.Regular rate.  ABD: Soft non tender.   Ext: No edema  MS: Adequate ROM spine, shoulders, hips and knees.  Skin: Intact, no ulcerations or rash noted.  Psych: Good eye contact, normal affect. Memory intact not anxious or depressed appearing.  CNS: CN 2-12 intact,  power,  normal throughout.no focal deficits noted.        Assessment & Plan:  ESSENTIAL HYPERTENSION, BENIGN Controlled, no change in medication DASH diet and commitment to daily physical activity for a minimum of 30 minutes discussed and encouraged, as a part of hypertension management. The importance of attaining a healthy weight is also discussed.    Allergic rhinitis Uncontrolled , needs to add daily intranasal steroid and saline flushes to oral med  Short course of prednisone written for as needed use if symptoms flare   Diabetes mellitus without complication Goal for OBS9G is less than 7, explained he rationale to pt Updated lab needed at/ before next visit. Patient advised to reduce carb and sweets, commit to regular physical activity, take meds as prescribed, test blood as directed, and attempt to lose weight, to improve blood sugar control.    Obesity (BMI 30-39.9) unchanged ucated about  the importance of commitment to a  minimum of 150 minutes of exercise per week. The importance of healthy food choices with portion control discussed. Encouraged to start a food diary, count calories and to consider  joining a support group. Sample diet sheets offered. Goals set by the patient for the next several months.      Wheezing Intermittent nocturnal wheeze, uses proventil as needed, on avg 2 to 3 times per month   Need for prophylactic vaccination and inoculation against influenza Vaccine administered at visit.

## 2014-06-30 NOTE — Patient Instructions (Signed)
F/u in 4 month, call if you need me before   Please start using nasonex and claritin every day for allergies  Prednisone for 5 days only is sent in if you get worse  For allergies   Foot exam today is good  Non fasting HBa1C chem 7 and EGFr, Tsh  In am   Work on food choice, and portion control to help blood sugar        

## 2014-06-30 NOTE — Assessment & Plan Note (Signed)
unchanged ucated about  the importance of commitment to a  minimum of 150 minutes of exercise per week. The importance of healthy food choices with portion control discussed. Encouraged to start a food diary, count calories and to consider  joining a support group. Sample diet sheets offered. Goals set by the patient for the next several months.

## 2014-06-30 NOTE — Assessment & Plan Note (Signed)
Intermittent nocturnal wheeze, uses proventil as needed, on avg 2 to 3 times per month

## 2014-06-30 NOTE — Assessment & Plan Note (Signed)
Goal for hBa1C is less than 7, explained he rationale to pt Updated lab needed at/ before next visit. Patient advised to reduce carb and sweets, commit to regular physical activity, take meds as prescribed, test blood as directed, and attempt to lose weight, to improve blood sugar control.

## 2014-06-30 NOTE — Assessment & Plan Note (Signed)
Uncontrolled , needs to add daily intranasal steroid and saline flushes to oral med  Short course of prednisone written for as needed use if symptoms flare

## 2014-07-16 ENCOUNTER — Other Ambulatory Visit: Payer: Self-pay | Admitting: Family Medicine

## 2014-09-09 ENCOUNTER — Other Ambulatory Visit: Payer: Self-pay | Admitting: Family Medicine

## 2014-09-17 ENCOUNTER — Other Ambulatory Visit: Payer: Self-pay

## 2014-09-17 ENCOUNTER — Telehealth: Payer: Self-pay

## 2014-09-17 DIAGNOSIS — E119 Type 2 diabetes mellitus without complications: Secondary | ICD-10-CM

## 2014-09-17 DIAGNOSIS — J3089 Other allergic rhinitis: Secondary | ICD-10-CM

## 2014-09-17 DIAGNOSIS — E785 Hyperlipidemia, unspecified: Secondary | ICD-10-CM

## 2014-09-17 DIAGNOSIS — I10 Essential (primary) hypertension: Secondary | ICD-10-CM

## 2014-09-17 MED ORDER — PROMETHAZINE-DM 6.25-15 MG/5ML PO SYRP: 5.0000 mL | ORAL_SOLUTION | Freq: Every evening | ORAL | Status: DC | PRN

## 2014-09-17 MED ORDER — PREDNISONE 5 MG PO TABS: 5.0000 mg | ORAL_TABLET | Freq: Two times a day (BID) | ORAL | Status: AC

## 2014-09-17 NOTE — Telephone Encounter (Signed)
Ok to refill both x 1, also pls ensure shjhe has 7monthj f/u scheduled with appropriate labs before visit, dx HTN , DM hyperlipid, need to order if not and schedule also pls

## 2014-09-17 NOTE — Telephone Encounter (Signed)
Patient aware.  Appointment made for 6/28.  Labs to be done mailed to patient with appointment card.

## 2014-09-17 NOTE — Addendum Note (Signed)
Addended by: Denman George B on: 09/17/2014 09:08 AM   Modules accepted: Orders

## 2014-11-10 ENCOUNTER — Other Ambulatory Visit: Payer: Self-pay | Admitting: Family Medicine

## 2014-11-11 LAB — HM DIABETES EYE EXAM

## 2014-12-02 ENCOUNTER — Encounter: Payer: Self-pay | Admitting: *Deleted

## 2014-12-02 ENCOUNTER — Ambulatory Visit: Payer: BLUE CROSS/BLUE SHIELD | Admitting: Family Medicine

## 2014-12-30 ENCOUNTER — Encounter: Payer: Self-pay | Admitting: Family Medicine

## 2015-02-11 ENCOUNTER — Other Ambulatory Visit: Payer: Self-pay | Admitting: Family Medicine

## 2015-02-25 ENCOUNTER — Encounter: Payer: Self-pay | Admitting: Family Medicine

## 2015-02-25 ENCOUNTER — Ambulatory Visit (INDEPENDENT_AMBULATORY_CARE_PROVIDER_SITE_OTHER): Payer: BLUE CROSS/BLUE SHIELD | Admitting: Family Medicine

## 2015-02-25 VITALS — BP 128/82 | HR 65 | Resp 16 | Ht 63.0 in | Wt 184.0 lb

## 2015-02-25 DIAGNOSIS — E785 Hyperlipidemia, unspecified: Secondary | ICD-10-CM

## 2015-02-25 DIAGNOSIS — E669 Obesity, unspecified: Secondary | ICD-10-CM

## 2015-02-25 DIAGNOSIS — I10 Essential (primary) hypertension: Secondary | ICD-10-CM

## 2015-02-25 DIAGNOSIS — K0889 Other specified disorders of teeth and supporting structures: Secondary | ICD-10-CM

## 2015-02-25 DIAGNOSIS — J309 Allergic rhinitis, unspecified: Secondary | ICD-10-CM | POA: Diagnosis not present

## 2015-02-25 DIAGNOSIS — E119 Type 2 diabetes mellitus without complications: Secondary | ICD-10-CM | POA: Diagnosis not present

## 2015-02-25 DIAGNOSIS — K088 Other specified disorders of teeth and supporting structures: Secondary | ICD-10-CM

## 2015-02-25 LAB — COMPLETE METABOLIC PANEL WITH GFR
ALBUMIN: 3.8 g/dL (ref 3.6–5.1)
ALT: 16 U/L (ref 6–29)
AST: 17 U/L (ref 10–35)
Alkaline Phosphatase: 102 U/L (ref 33–130)
BILIRUBIN TOTAL: 0.4 mg/dL (ref 0.2–1.2)
BUN: 10 mg/dL (ref 7–25)
CO2: 35 mmol/L — ABNORMAL HIGH (ref 20–31)
Calcium: 9 mg/dL (ref 8.6–10.4)
Chloride: 102 mmol/L (ref 98–110)
Creat: 0.65 mg/dL (ref 0.50–1.05)
GFR, Est African American: >89 mL/min (ref >=60)
GLUCOSE: 166 mg/dL — AB (ref 65–99)
Potassium: 3.6 mmol/L (ref 3.5–5.3)
SODIUM: 143 mmol/L (ref 135–146)
TOTAL PROTEIN: 6.6 g/dL (ref 6.1–8.1)

## 2015-02-25 LAB — LIPID PANEL
CHOLESTEROL: 167 mg/dL (ref 125–200)
HDL: 82 mg/dL (ref >=46)
LDL CALC: 68 mg/dL (ref <130)
Total CHOL/HDL Ratio: 2 Ratio (ref <=5.0)
Triglycerides: 84 mg/dL (ref <150)
VLDL: 17 mg/dL (ref <30)

## 2015-02-25 LAB — HEMOGLOBIN A1C
Hgb A1c MFr Bld: 7.7 % — ABNORMAL HIGH (ref <5.7)
Mean Plasma Glucose: 174 mg/dL — ABNORMAL HIGH (ref <117)

## 2015-02-25 LAB — TSH: TSH: 0.638 u[IU]/mL (ref 0.350–4.500)

## 2015-02-25 MED ORDER — PREDNISONE 5 MG PO TABS: ORAL_TABLET | ORAL | Status: DC

## 2015-02-25 MED ORDER — IBUPROFEN 800 MG PO TABS: 800.0000 mg | ORAL_TABLET | Freq: Three times a day (TID) | ORAL | Status: DC | PRN

## 2015-02-25 NOTE — Progress Notes (Signed)
Subjective:    Patient ID: Daisy Robbins, female    DOB: 1961/03/21, 54 y.o.   MRN: [id number]  HPI    TAMMEE THIELKE     MRN: [id number]      DOB: [birth year]   HPI [name] is here for follow up and re-evaluation of chronic medical conditions, medication management and review of any available recent lab and radiology data.  Preventive health is updated, specifically  Cancer screening and Immunization. Holding off on flu vaccine until later in the season The PTstaes taking half prescribed dose of metformin due to GI upset,  But wants to re attempt to take prescribed dose before an additional new med for sub optimally controlled and worsened diabetes, will also see educator New dental pain and requests anti inflammatory until she can see her dentist in the next several days, no fever ROS Denies recent fever or chills. C/o increased sinus pressure, nasal congestion, denies  ear pain or sore throat.Mucus is clear Denies chest congestion, productive cough or wheezing. Denies chest pains, palpitations and leg swelling Denies abdominal pain, nausea, vomiting,diarrhea or constipation.   Denies dysuria, frequency, hesitancy or incontinence. Denies joint pain, swelling and limitation in mobility. Denies headaches, seizures, numbness, or tingling. Denies depression, anxiety or insomnia. Denies skin break down or rash.   PE  BP 128/82 mmHg  Pulse 65  Resp 16  Ht 5\' 3"  (1.6 m)  Wt 184 lb (83.462 kg)  BMI 32.60 kg/m2  SpO2 99%  Patient alert and oriented and in no cardiopulmonary distress.  HEENT: No facial asymmetry, EOMI,   oropharynx pink and moist.  Neck supple no JVD, no mass.no sinus tenderness, TM clear, erythema and edema of nasal mucosa Chest: Clear to auscultation bilaterally.  CVS: S1, S2 no murmurs, no S3.Regular rate.  ABD: Soft non tender.   Ext: No edema  MS: Adequate ROM spine, shoulders, hips and knees.  Skin: Intact, no ulcerations or rash noted.  Psych:  Good eye contact, normal affect. Memory intact not anxious or depressed appearing.  CNS: CN 2-12 intact, power,  normal throughout.no focal deficits noted.   Assessment & Plan   Diabetes mellitus without complication Not at goal based on pt's age and has worsened in past year, also need for more frequent HBa1C testing is stressed Refer diabetic educator. Has had GI intolerance with metformin reportedly, therefore takiung one and not two, wants to try again to take as prescribed and will call to notifiy if unable [name] is reminded of the importance of commitment to daily physical activity for 30 minutes or more, as able and the need to limit carbohydrate intake to 30 to 60 grams per meal to help with blood sugar control.   The need to take medication as prescribed, test blood sugar as directed, and to call between visits if there is a concern that blood sugar is uncontrolled is also discussed.   [name] is reminded of the importance of daily foot exam, annual eye examination, and good blood sugar, blood pressure and cholesterol control.  Diabetic Labs Latest Ref Rng 02/25/2015 02/24/2015 01/22/2014 02/25/2013 03/02/2012  HbA1c <5.7 % - 7.7(H) 7.4(H) 6.8(H) -  Microalbumin <2.0 mg/dL 5.0(H) - 0.50 0.50 -  Micro/Creat Ratio 0.0 - 30.0 mg/g 28.6 - 8.3 3.0 -  Chol 125 - 200 mg/dL - 167 201(H) 180 -  HDL >=46 mg/dL - 82 95 76 -  Calc LDL <130 mg/dL - 68 91 84 -  Triglycerides <150 mg/dL -  84 74 98 -  Creatinine 0.50 - 1.05 mg/dL - 0.65 0.65 0.74 0.91   BP/Weight 02/25/2015 06/30/2014 01/20/2014 10/30/2013 10/01/2013 03/19/2013 11/10/3708  Systolic BP 626 948 546 270 350 093 818  Diastolic BP 82 70 82 82 84 78 82  Wt. (Lbs) 184 181 181 180.8 185 188.12 183.4  BMI 32.6 32.07 32.07 32.04 32.78 33.33 32.5   Foot/eye exam completion dates Latest Ref Rng 11/11/2014 06/30/2014  Eye Exam No Retinopathy No Retinopathy -  Foot Form Completion - - Done         ESSENTIAL HYPERTENSION,  BENIGN Controlled, no change in medication DASH diet and commitment to daily physical activity for a minimum of 30 minutes discussed and encouraged, as a part of hypertension management. The importance of attaining a healthy weight is also discussed.  BP/Weight 02/25/2015 06/30/2014 01/20/2014 10/30/2013 10/01/2013 03/19/2013 2/99/3716  Systolic BP 967 893 810 175 102 585 277  Diastolic BP 82 70 82 82 84 78 82  Wt. (Lbs) 184 181 181 180.8 185 188.12 183.4  BMI 32.6 32.07 32.07 32.04 32.78 33.33 32.5        Allergic rhinitis Uncontrolled, prednisone dose pack prescribed  Hyperlipidemia LDL goal <100 Hyperlipidemia:Low fat diet discussed and encouraged.   Lipid Panel  Lab Results  Component Value Date   CHOL 167 02/24/2015   HDL 82 02/24/2015   LDLCALC 68 02/24/2015   TRIG 84 02/24/2015   CHOLHDL 2.0 02/24/2015  Excellent and on no statin therapy, pt chooses to stay off statin despite knowledge that recommended due to inc cv risk being diabetic      Obesity (BMI 30-39.9) Deteriorated. Patient re-educated about  the importance of commitment to a  minimum of 150 minutes of exercise per week.  The importance of healthy food choices with portion control discussed. Encouraged to start a food diary, count calories and to consider  joining a support group. Sample diet sheets offered. Goals set by the patient for the next several months.   Weight /BMI 02/25/2015 06/30/2014 01/20/2014  WEIGHT 184 lb 181 lb 181 lb  HEIGHT 5\' 3"  5\' 3"  5\' 3"   BMI 32.6 kg/m2 32.07 kg/m2 32.07 kg/m2    Current exercise per week 150 minutes.   Toothache acute flare of pain due to cavity in right upper jaw, ibuprofen prescribed for short term use, pt intends to see dentist soon     Review of Systems     Objective:   Physical Exam        Assessment & Plan:

## 2015-02-25 NOTE — Patient Instructions (Signed)
F/u witth rectal 12/22 or after, call if you need me sooner  Prednisone sent in for 6 days for chest tightness.  Ibuprofen 800 mg sent for tooth ache  You are referred to diabetic educator important to go  START metformin tWICE daily as prescribed, please contact us in 1 week if unable to tolerate , your blood sugar is too high  Non fast labs 12/20 or after  Thanks for choosing [facility], we consider it a privelige to serve you.

## 2015-02-26 LAB — MICROALBUMIN / CREATININE URINE RATIO
CREATININE, URINE: 174.9 mg/dL
MICROALB UR: 5 mg/dL — AB (ref <2.0)
Microalb Creat Ratio: 28.6 mg/g (ref 0.0–30.0)

## 2015-02-27 DIAGNOSIS — K0889 Other specified disorders of teeth and supporting structures: Secondary | ICD-10-CM | POA: Insufficient documentation

## 2015-02-27 NOTE — Assessment & Plan Note (Signed)
Deteriorated. Patient re-educated about  the importance of commitment to a  minimum of 150 minutes of exercise per week.  The importance of healthy food choices with portion control discussed. Encouraged to start a food diary, count calories and to consider  joining a support group. Sample diet sheets offered. Goals set by the patient for the next several months.   Weight /BMI 02/25/2015 06/30/2014 01/20/2014  WEIGHT 184 lb 181 lb 181 lb  HEIGHT 5\' 3"  5\' 3"  5\' 3"   BMI 32.6 kg/m2 32.07 kg/m2 32.07 kg/m2    Current exercise per week 150 minutes.

## 2015-02-27 NOTE — Assessment & Plan Note (Signed)
acute flare of pain due to cavity in right upper jaw, ibuprofen prescribed for short term use, pt intends to see dentist soon

## 2015-02-27 NOTE — Assessment & Plan Note (Signed)
Uncontrolled, prednisone dose pack prescribed 

## 2015-02-27 NOTE — Assessment & Plan Note (Signed)
Not at goal based on pt's age and has worsened in past year, also need for more frequent HBa1C testing is stressed Refer diabetic educator. Has had GI intolerance with metformin reportedly, therefore takiung one and not two, wants to try again to take as prescribed and will call to notifiy if unable Daisy Robbins is reminded of the importance of commitment to daily physical activity for 30 minutes or more, as able and the need to limit carbohydrate intake to 30 to 60 grams per meal to help with blood sugar control.   The need to take medication as prescribed, test blood sugar as directed, and to call between visits if there is a concern that blood sugar is uncontrolled is also discussed.   Daisy Robbins is reminded of the importance of daily foot exam, annual eye examination, and good blood sugar, blood pressure and cholesterol control.  Diabetic Labs Latest Ref Rng 02/25/2015 02/24/2015 01/22/2014 02/25/2013 03/02/2012  HbA1c <5.7 % - 7.7(H) 7.4(H) 6.8(H) -  Microalbumin <2.0 mg/dL 5.0(H) - 0.50 0.50 -  Micro/Creat Ratio 0.0 - 30.0 mg/g 28.6 - 8.3 3.0 -  Chol 125 - 200 mg/dL - 167 201(H) 180 -  HDL >=46 mg/dL - 82 95 76 -  Calc LDL <130 mg/dL - 68 91 84 -  Triglycerides <150 mg/dL - 84 74 98 -  Creatinine 0.50 - 1.05 mg/dL - 0.65 0.65 0.74 0.91   BP/Weight 02/25/2015 06/30/2014 01/20/2014 10/30/2013 10/01/2013 03/19/2013 8/78/6767  Systolic BP 209 470 962 836 629 476 546  Diastolic BP 82 70 82 82 84 78 82  Wt. (Lbs) 184 181 181 180.8 185 188.12 183.4  BMI 32.6 32.07 32.07 32.04 32.78 33.33 32.5   Foot/eye exam completion dates Latest Ref Rng 11/11/2014 06/30/2014  Eye Exam No Retinopathy No Retinopathy -  Foot Form Completion - - Done

## 2015-02-27 NOTE — Assessment & Plan Note (Signed)
Hyperlipidemia:Low fat diet discussed and encouraged.   Lipid Panel  Lab Results  Component Value Date   CHOL 167 02/24/2015   HDL 82 02/24/2015   LDLCALC 68 02/24/2015   TRIG 84 02/24/2015   CHOLHDL 2.0 02/24/2015  Excellent and on no statin therapy, pt chooses to stay off statin despite knowledge that recommended due to inc cv risk being diabetic

## 2015-02-27 NOTE — Assessment & Plan Note (Signed)
Controlled, no change in medication DASH diet and commitment to daily physical activity for a minimum of 30 minutes discussed and encouraged, as a part of hypertension management. The importance of attaining a healthy weight is also discussed.  BP/Weight 02/25/2015 06/30/2014 01/20/2014 10/30/2013 10/01/2013 03/19/2013 12/10/6149  Systolic BP 834 373 578 978 478 412 820  Diastolic BP 82 70 82 82 84 78 82  Wt. (Lbs) 184 181 181 180.8 185 188.12 183.4  BMI 32.6 32.07 32.07 32.04 32.78 33.33 32.5

## 2015-04-24 ENCOUNTER — Other Ambulatory Visit: Payer: Self-pay | Admitting: Family Medicine

## 2015-06-18 ENCOUNTER — Other Ambulatory Visit: Payer: Self-pay

## 2015-06-18 ENCOUNTER — Telehealth: Payer: Self-pay | Admitting: Family Medicine

## 2015-06-18 MED ORDER — PROMETHAZINE-DM 6.25-15 MG/5ML PO SYRP: 5.0000 mL | ORAL_SOLUTION | Freq: Every evening | ORAL | Status: DC | PRN

## 2015-06-18 NOTE — Telephone Encounter (Signed)
Patient is asking for a cough medication, she states that she can feel some chest tightness, please advise?

## 2015-06-18 NOTE — Telephone Encounter (Signed)
Med refilled x 1  

## 2015-07-24 ENCOUNTER — Other Ambulatory Visit: Payer: Self-pay

## 2015-07-24 MED ORDER — BENAZEPRIL-HYDROCHLOROTHIAZIDE 20-12.5 MG PO TABS: ORAL_TABLET | ORAL | Status: DC

## 2015-08-03 ENCOUNTER — Ambulatory Visit (INDEPENDENT_AMBULATORY_CARE_PROVIDER_SITE_OTHER): Payer: BLUE CROSS/BLUE SHIELD | Admitting: Family Medicine

## 2015-08-03 ENCOUNTER — Encounter: Payer: Self-pay | Admitting: Family Medicine

## 2015-08-03 VITALS — BP 118/78 | HR 64 | Temp 99.0°F | Resp 16 | Ht 63.0 in | Wt 181.4 lb

## 2015-08-03 DIAGNOSIS — E119 Type 2 diabetes mellitus without complications: Secondary | ICD-10-CM | POA: Diagnosis not present

## 2015-08-03 DIAGNOSIS — Z1159 Encounter for screening for other viral diseases: Secondary | ICD-10-CM

## 2015-08-03 DIAGNOSIS — R42 Dizziness and giddiness: Secondary | ICD-10-CM | POA: Diagnosis not present

## 2015-08-03 DIAGNOSIS — I10 Essential (primary) hypertension: Secondary | ICD-10-CM | POA: Diagnosis not present

## 2015-08-03 DIAGNOSIS — J111 Influenza due to unidentified influenza virus with other respiratory manifestations: Secondary | ICD-10-CM

## 2015-08-03 MED ORDER — OSELTAMIVIR PHOSPHATE 75 MG PO CAPS: 75.0000 mg | ORAL_CAPSULE | Freq: Two times a day (BID) | ORAL | Status: DC

## 2015-08-03 MED ORDER — MECLIZINE HCL 25 MG PO TABS
25.0000 mg | ORAL_TABLET | Freq: Three times a day (TID) | ORAL | Status: DC | PRN
Start: 2015-08-03 — End: 2016-02-22

## 2015-08-03 NOTE — Assessment & Plan Note (Signed)
Inadequate control, updated lab this week Daisy Robbins is reminded of the importance of commitment to daily physical activity for 30 minutes or more, as able and the need to limit carbohydrate intake to 30 to 60 grams per meal to help with blood sugar control.   The need to take medication as prescribed, test blood sugar as directed, and to call between visits if there is a concern that blood sugar is uncontrolled is also discussed.   Daisy Robbins is reminded of the importance of daily foot exam, annual eye examination, and good blood sugar, blood pressure and cholesterol control.  Diabetic Labs Latest Ref Rng 02/25/2015 02/24/2015 01/22/2014 02/25/2013 03/02/2012  HbA1c <5.7 % - 7.7(H) 7.4(H) 6.8(H) -  Microalbumin <2.0 mg/dL 5.0(H) - 0.50 0.50 -  Micro/Creat Ratio 0.0 - 30.0 mg/g 28.6 - 8.3 3.0 -  Chol 125 - 200 mg/dL - 167 201(H) 180 -  HDL >=46 mg/dL - 82 95 76 -  Calc LDL <130 mg/dL - 68 91 84 -  Triglycerides <150 mg/dL - 84 74 98 -  Creatinine 0.50 - 1.05 mg/dL - 0.65 0.65 0.74 0.91   BP/Weight 08/03/2015 02/25/2015 06/30/2014 01/20/2014 10/30/2013 10/01/2013 [id number]  Systolic BP 123456 0000000 [id number] 123456 [id number]  Diastolic BP 78 82 70 82 82 84 78  Wt. (Lbs) 181.4 184 181 181 180.8 185 188.12  BMI 32.14 32.6 32.07 32.07 32.04 32.78 33.33   Foot/eye exam completion dates Latest Ref Rng 08/03/2015 11/11/2014  Eye Exam No Retinopathy - No Retinopathy  Foot Form Completion - Done -

## 2015-08-03 NOTE — Assessment & Plan Note (Signed)
Acute onset started today, antivert prescribed for symptom control as needed

## 2015-08-03 NOTE — Patient Instructions (Addendum)
Physical exam in 3.5 month, call if you need me sooner  You are treated today for influenza and vertigo  Work excuse start today, return March 6  Two meds are sent to your pharmacy  CBc, chem 7 and EGFR, hBA1C this week please  Influenza, Adult Influenza (flu) is an infection in the mouth, nose, and throat (respiratory tract) caused by a virus. The flu can make you feel very ill. Influenza spreads easily from person to person (contagious).  HOME CARE   Only take medicines as told by your doctor.  Use a cool mist humidifier to make breathing easier.  Get plenty of rest until your fever goes away. This usually takes 3 to 4 days.  Drink enough fluids to keep your pee (urine) clear or pale yellow.  Cover your mouth and nose when you cough or sneeze.  Wash your hands well to avoid spreading the flu.  Stay home from work or school until your fever has been gone for at least 1 full day.  Get a flu shot every year. GET HELP RIGHT AWAY IF:   You have trouble breathing or feel short of breath.  Your skin or nails turn blue.  You have severe neck pain or stiffness.  You have a severe headache, facial pain, or earache.  Your fever gets worse or keeps coming back.  You feel sick to your stomach (nauseous), throw up (vomit), or have watery poop (diarrhea).  You have chest pain.  You have a deep cough that gets worse, or you cough up more thick spit (mucus). MAKE SURE YOU:   Understand these instructions.  Will watch your condition.  Will get help right away if you are not doing well or get worse.   This information is not intended to replace advice given to you by your health care provider. Make sure you discuss any questions you have with your health care provider.   Document Released: 03/01/2008 Document Revised: 06/13/2014 Document Reviewed: 08/22/2011 Elsevier Interactive Patient Education [employer].

## 2015-08-03 NOTE — Progress Notes (Signed)
Subjective:    Patient ID: Daisy Robbins, female    DOB: [birth year], 55 y.o.   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 Taking medication as prescribed but not checking blood sugar   Review of Systems See HPI . Denies sinus pressure, nasal congestion, ear pain . Denies chest congestion, productive cough or wheezing. Denies chest pains, palpitations and leg swelling Denies abdominal pain, nausea, vomiting,diarrhea or constipation.   Denies dysuria, frequency, hesitancy or incontinence.  Denies headaches, seizures, numbness, or tingling. Denies depression, anxiety or insomnia. Denies skin break down or rash.        Objective:   Physical Exam BP 118/78 mmHg  Pulse 64  Temp(Src) 99 F (37.2 C) (Oral)  Resp 16  Ht 5\' 3"  (1.6 m)  Wt 181 lb 6.4 oz (82.283 kg)  BMI 32.14 kg/m2  SpO2 98% Patient alert and oriented and ill appearing  HEENT: No facial asymmetry, EOMI,   oropharynx pink and moist.No exudate  Neck supple no JVD, cervical adenitis present , no sinus tenderness, TM clear Chest: Clear to auscultation bilaterally.  CVS: S1, S2 no murmurs, no S3.Regular rate.  ABD: Soft non tender.   Ext: No edema  MS: Adequate ROM spine, shoulders, hips and knees.  Skin: Intact, no ulcerations or rash noted.  Psych: Good eye contact, normal affect. Memory intact not anxious or depressed appearing.  CNS: CN 2-12 intact, power,  normal throughout.no focal deficits noted.        Assessment & Plan:  Influenza with respiratory manifestation Acute onset of URI symptoms, tamiflu, general pt ed provided and work excuse for 1 week  Vertigo Acute onset started today, antivert prescribed for symptom control as needed  ESSENTIAL HYPERTENSION, BENIGN Controlled, no change in medication DASH diet  and commitment to daily physical activity for a minimum of 30 minutes discussed and encouraged, as a part of hypertension management. The importance of attaining a healthy weight is also discussed.  BP/Weight 08/03/2015 02/25/2015 06/30/2014 01/20/2014 10/30/2013 10/01/2013 123XX123  Systolic BP 123456 0000000 0000000 123456 Q000111Q 123456 [id number]  Diastolic BP 78 82 70 82 82 84 78  Wt. (Lbs) 181.4 184 181 181 180.8 185 188.12  BMI 32.14 32.6 32.07 32.07 32.04 32.78 33.33        Diabetes mellitus without complication Inadequate control, updated lab this week Ms. Knoop is reminded of the importance of commitment to daily physical activity for 30 minutes or more, as able and the need to limit carbohydrate intake to 30 to 60 grams per meal to help with blood sugar control.   The need to take medication as prescribed, test blood sugar as directed, and to call between visits if there is a concern that blood sugar is uncontrolled is also discussed.   Ms. Hopf is reminded of the importance of daily foot exam, annual eye examination, and good blood sugar, blood pressure and cholesterol control.  Diabetic Labs Latest Ref Rng 02/25/2015 02/24/2015 01/22/2014 02/25/2013 03/02/2012  HbA1c <5.7 % - 7.7(H) 7.4(H) 6.8(H) -  Microalbumin <2.0 mg/dL 5.0(H) - 0.50 0.50 -  Micro/Creat Ratio 0.0 - 30.0 mg/g 28.6 - 8.3 3.0 -  Chol 125 - 200 mg/dL - 167 201(H) 180 -  HDL >=46 mg/dL - 82 95 76 -  Calc LDL <130 mg/dL - 68 91 84 -  Triglycerides <150 mg/dL - 84 74 98 -  Creatinine 0.50 - 1.05 mg/dL - 0.65 0.65 0.74 0.91   BP/Weight 08/03/2015 02/25/2015 06/30/2014 01/20/2014 10/30/2013 10/01/2013 [id number]  Systolic BP 123456 0000000 0000000 123456 [id number] 123456 [id number]  Diastolic BP 78 82 70 82 82 84 78  Wt. (Lbs) 181.4 184 181 181 180.8 185 188.12  BMI 32.14 32.6 32.07 32.07 32.04 32.78 33.33   Foot/eye exam completion dates Latest Ref Rng 08/03/2015 11/11/2014  Eye Exam No Retinopathy - No Retinopathy  Foot Form Completion - Done -

## 2015-08-03 NOTE — Assessment & Plan Note (Signed)
Acute onset of URI symptoms, tamiflu, general pt ed provided and work excuse for 1 week

## 2015-08-03 NOTE — Assessment & Plan Note (Signed)
Controlled, no change in medication DASH diet and commitment to daily physical activity for a minimum of 30 minutes discussed and encouraged, as a part of hypertension management. The importance of attaining a healthy weight is also discussed.  BP/Weight 08/03/2015 02/25/2015 06/30/2014 01/20/2014 10/30/2013 10/01/2013 [id number]  Systolic BP 123456 0000000 0000000 123456 Q000111Q 123456 [id number]  Diastolic BP 78 82 70 82 82 84 78  Wt. (Lbs) 181.4 184 181 181 180.8 185 188.12  BMI 32.14 32.6 32.07 32.07 32.04 32.78 33.33

## 2015-08-12 DIAGNOSIS — Z0289 Encounter for other administrative examinations: Secondary | ICD-10-CM

## 2015-09-29 ENCOUNTER — Other Ambulatory Visit: Payer: Self-pay

## 2015-09-29 MED ORDER — MONTELUKAST SODIUM 10 MG PO TABS: 10.0000 mg | ORAL_TABLET | Freq: Every day | ORAL | Status: DC

## 2015-11-03 ENCOUNTER — Other Ambulatory Visit: Payer: Self-pay

## 2015-11-03 MED ORDER — METFORMIN HCL 1000 MG PO TABS: 1000.0000 mg | ORAL_TABLET | Freq: Two times a day (BID) | ORAL | Status: DC

## 2015-12-09 ENCOUNTER — Other Ambulatory Visit: Payer: Self-pay

## 2015-12-09 MED ORDER — BENAZEPRIL-HYDROCHLOROTHIAZIDE 20-12.5 MG PO TABS: ORAL_TABLET | ORAL | Status: DC

## 2016-01-19 ENCOUNTER — Other Ambulatory Visit: Payer: Self-pay | Admitting: Family Medicine

## 2016-02-22 ENCOUNTER — Ambulatory Visit (INDEPENDENT_AMBULATORY_CARE_PROVIDER_SITE_OTHER): Payer: BLUE CROSS/BLUE SHIELD | Admitting: Family Medicine

## 2016-02-22 ENCOUNTER — Encounter: Payer: Self-pay | Admitting: Family Medicine

## 2016-02-22 VITALS — BP 114/80 | HR 77 | Resp 16 | Ht 63.0 in | Wt 168.0 lb

## 2016-02-22 DIAGNOSIS — Z23 Encounter for immunization: Secondary | ICD-10-CM

## 2016-02-22 DIAGNOSIS — F32A Depression, unspecified: Secondary | ICD-10-CM

## 2016-02-22 DIAGNOSIS — E785 Hyperlipidemia, unspecified: Secondary | ICD-10-CM | POA: Diagnosis not present

## 2016-02-22 DIAGNOSIS — F329 Major depressive disorder, single episode, unspecified: Secondary | ICD-10-CM | POA: Diagnosis not present

## 2016-02-22 DIAGNOSIS — E663 Overweight: Secondary | ICD-10-CM

## 2016-02-22 DIAGNOSIS — I10 Essential (primary) hypertension: Secondary | ICD-10-CM | POA: Diagnosis not present

## 2016-02-22 DIAGNOSIS — J3089 Other allergic rhinitis: Secondary | ICD-10-CM

## 2016-02-22 DIAGNOSIS — E119 Type 2 diabetes mellitus without complications: Secondary | ICD-10-CM

## 2016-02-22 MED ORDER — MIRTAZAPINE 7.5 MG PO TABS: 7.5000 mg | ORAL_TABLET | Freq: Every day | ORAL | 2 refills | Status: DC

## 2016-02-22 MED ORDER — FLUOXETINE HCL 20 MG PO TABS: 20.0000 mg | ORAL_TABLET | Freq: Every day | ORAL | 3 refills | Status: DC

## 2016-02-22 NOTE — Assessment & Plan Note (Addendum)
Controlled, no change in medication, no recent or current flare

## 2016-02-22 NOTE — Assessment & Plan Note (Signed)
Start remeron 7.5 mg daily and refer to therapy

## 2016-02-22 NOTE — Assessment & Plan Note (Signed)
Controlled, no change in medication DASH diet and commitment to daily physical activity for a minimum of 30 minutes discussed and encouraged, as a part of hypertension management. The importance of attaining a healthy weight is also discussed.  BP/Weight 02/22/2016 08/03/2015 02/25/2015 06/30/2014 01/20/2014 10/30/2013 123456  Systolic BP 99991111 123456 0000000 0000000 123456 Q000111Q 123456  Diastolic BP 80 78 82 70 82 82 84  Wt. (Lbs) 168 181.4 184 181 181 180.8 185  BMI 29.76 32.14 32.6 32.07 32.07 32.04 32.78

## 2016-02-22 NOTE — Assessment & Plan Note (Signed)
Hyperlipidemia:Low fat diet discussed and encouraged.   Lipid Panel  Lab Results  Component Value Date   CHOL 167 02/24/2015   HDL 82 02/24/2015   LDLCALC 68 02/24/2015   TRIG 84 02/24/2015   CHOLHDL 2.0 02/24/2015     Updated lab needed at/ before next visit.

## 2016-02-22 NOTE — Assessment & Plan Note (Signed)
Improved. Pt applauded on succesful weight loss through lifestyle change, and encouraged to continue same. Weight loss goal set for the next several months.  

## 2016-02-22 NOTE — Assessment & Plan Note (Signed)
After obtaining informed consent, the vaccine is  administered by LPN.  

## 2016-02-22 NOTE — Assessment & Plan Note (Signed)
Daisy Robbins is reminded of the importance of commitment to daily physical activity for 30 minutes or more, as able and the need to limit carbohydrate intake to 30 to 60 grams per meal to help with blood sugar control.   The need to take medication as prescribed, test blood sugar as directed, and to call between visits if there is a concern that blood sugar is uncontrolled is also discussed.   Daisy Robbins is reminded of the importance of daily foot exam, annual eye examination, and good blood sugar, blood pressure and cholesterol control. Updated lab needed states will get this done this week.   Diabetic Labs Latest Ref Rng & Units 02/25/2015 02/24/2015 01/22/2014 02/25/2013 03/02/2012  HbA1c <5.7 % - 7.7(H) 7.4(H) 6.8(H) -  Microalbumin <2.0 mg/dL 5.0(H) - 0.50 0.50 -  Micro/Creat Ratio 0.0 - 30.0 mg/g 28.6 - 8.3 3.0 -  Chol 125 - 200 mg/dL - 167 201(H) 180 -  HDL >=46 mg/dL - 82 95 76 -  Calc LDL <130 mg/dL - 68 91 84 -  Triglycerides <150 mg/dL - 84 74 98 -  Creatinine 0.50 - 1.05 mg/dL - 0.65 0.65 0.74 0.91   BP/Weight 02/22/2016 08/03/2015 02/25/2015 06/30/2014 01/20/2014 10/30/2013 123456  Systolic BP 99991111 123456 0000000 0000000 123456 Q000111Q 123456  Diastolic BP 80 78 82 70 82 82 84  Wt. (Lbs) 168 181.4 184 181 181 180.8 185  BMI 29.76 32.14 32.6 32.07 32.07 32.04 32.78   Foot/eye exam completion dates Latest Ref Rng & Units 08/03/2015 11/11/2014  Eye Exam No Retinopathy - No Retinopathy  Foot Form Completion - Done -

## 2016-02-22 NOTE — Progress Notes (Signed)
AMAIRAH KOELLNER     MRN: [id number]      DOB: [birth year]   HPI Ms. Burdell is here for follow up and re-evaluation of chronic medical conditions, medication management and review of any available recent lab and radiology data.  Preventive health is updated, specifically  Cancer screening and Immunization.  Will schedule mammogram and next appt is physical  The PT denies any adverse reactions to current medications since the last visit.  5 week h/o symptoms of depression as she deals with suicide committed by her son in law, ready for help, she is neither suicidal or homicidal  Denies polyuria, polydipsia, blurred vision , or hypoglycemic episodes. Reduced metformin to one daily as her appetite is poor, sleep with tylenol pm is approx 5 hrs  ROS Denies recent fever or chills. Denies sinus pressure, nasal congestion, ear pain or sore throat. Denies chest congestion, productive cough or wheezing. Denies chest pains, palpitations and leg swelling Denies abdominal pain, nausea, vomiting,diarrhea or constipation.   Denies dysuria, frequency, hesitancy or incontinence. Denies joint pain, swelling and limitation in mobility. Denies headaches, seizures, numbneDenies depression, anxiety or insomnia. Denies skin break down or rash.   PE  BP 114/80   Pulse 77   Resp 16   Ht 5\' 3"  (1.6 m)   Wt 168 lb (76.2 kg)   SpO2 99%   BMI 29.76 kg/m   Patient alert and oriented and in no cardiopulmonary distress.  HEENT: No facial asymmetry, EOMI,   oropharynx pink and moist.  Neck supple no JVD, no mass.  Chest: Clear to auscultation bilaterally.  CVS: S1, S2 no murmurs, no S3.Regular rate.  ABD: Soft non tender.   Ext: No edema  MS: Adequate ROM spine, shoulders, hips and knees.  Skin: Intact, no ulcerations or rash noted.  Psych: Good eye contact,tearful affect. Memory intact not anxious but  depressed appearing.  CNS: CN 2-12 intact, power,  normal throughout.no focal deficits  noted.   Assessment & Plan  ESSENTIAL HYPERTENSION, BENIGN Controlled, no change in medication DASH diet and commitment to daily physical activity for a minimum of 30 minutes discussed and encouraged, as a part of hypertension management. The importance of attaining a healthy weight is also discussed.  BP/Weight 02/22/2016 08/03/2015 02/25/2015 06/30/2014 01/20/2014 10/30/2013 123456  Systolic BP 99991111 123456 0000000 0000000 123456 Q000111Q 123456  Diastolic BP 80 78 82 70 82 82 84  Wt. (Lbs) 168 181.4 184 181 181 180.8 185  BMI 29.76 32.14 32.6 32.07 32.07 32.04 32.78       Overweight (BMI 25.0-29.9) Improved. Pt applauded on succesful weight loss through lifestyle change, and encouraged to continue same. Weight loss goal set for the next several months.   Hyperlipidemia LDL goal <100 Hyperlipidemia:Low fat diet discussed and encouraged.   Lipid Panel  Lab Results  Component Value Date   CHOL 167 02/24/2015   HDL 82 02/24/2015   LDLCALC 68 02/24/2015   TRIG 84 02/24/2015   CHOLHDL 2.0 02/24/2015     Updated lab needed at/ before next visit.   Depression Start remeron 7.5 mg daily and refer to therapy  Allergic rhinitis Controlled, no change in medication, no recent or current flare   Need for prophylactic vaccination and inoculation against influenza After obtaining informed consent, the vaccine is  administered by LPN.   Diabetes mellitus without complication ([place]) Ms. Ishman is reminded of the importance of commitment to daily physical activity for 30 minutes or more, as able and the  need to limit carbohydrate intake to 30 to 60 grams per meal to help with blood sugar control.   The need to take medication as prescribed, test blood sugar as directed, and to call between visits if there is a concern that blood sugar is uncontrolled is also discussed.   Ms. Collinson is reminded of the importance of daily foot exam, annual eye examination, and good blood sugar, blood pressure and  cholesterol control. Updated lab needed states will get this done this week.   Diabetic Labs Latest Ref Rng & Units 02/25/2015 02/24/2015 01/22/2014 02/25/2013 03/02/2012  HbA1c <5.7 % - 7.7(H) 7.4(H) 6.8(H) -  Microalbumin <2.0 mg/dL 5.0(H) - 0.50 0.50 -  Micro/Creat Ratio 0.0 - 30.0 mg/g 28.6 - 8.3 3.0 -  Chol 125 - 200 mg/dL - 167 201(H) 180 -  HDL >=46 mg/dL - 82 95 76 -  Calc LDL <130 mg/dL - 68 91 84 -  Triglycerides <150 mg/dL - 84 74 98 -  Creatinine 0.50 - 1.05 mg/dL - 0.65 0.65 0.74 0.91   BP/Weight 02/22/2016 08/03/2015 02/25/2015 06/30/2014 01/20/2014 10/30/2013 123456  Systolic BP 99991111 123456 0000000 0000000 123456 Q000111Q 123456  Diastolic BP 80 78 82 70 82 82 84  Wt. (Lbs) 168 181.4 184 181 181 180.8 185  BMI 29.76 32.14 32.6 32.07 32.07 32.04 32.78   Foot/eye exam completion dates Latest Ref Rng & Units 08/03/2015 11/11/2014  Eye Exam No Retinopathy - No Retinopathy  Foot Form Completion - Done -

## 2016-02-22 NOTE — Patient Instructions (Signed)
Annual physical exam and re evaluation in 2 month, call if you n eed me sooner  New medication and referral for depression for you Info re [facility] provided today for you to attempt to get help with counselling for your daughter  Flu vaccine today  Labs this week  Pls schedule mammogram  I hope that you wil;l feel much better once you are taking new medication and seeing a counsellor, currently , you need both  Thank you  for choosing [facility]. We consider it a privelige to serve you.  Delivering excellent health care in a caring and  compassionate way is our goal.  Partnering with you,  so that together we can achieve this goal is our strategy.

## 2016-02-29 ENCOUNTER — Ambulatory Visit: Payer: BLUE CROSS/BLUE SHIELD | Admitting: Family Medicine

## 2016-04-14 ENCOUNTER — Other Ambulatory Visit: Payer: Self-pay | Admitting: Family Medicine

## 2016-04-27 ENCOUNTER — Encounter: Payer: Self-pay | Admitting: Family Medicine

## 2016-04-27 ENCOUNTER — Ambulatory Visit (INDEPENDENT_AMBULATORY_CARE_PROVIDER_SITE_OTHER): Payer: BLUE CROSS/BLUE SHIELD | Admitting: Family Medicine

## 2016-04-27 VITALS — BP 136/82 | HR 72 | Temp 98.1°F | Resp 20 | Ht 63.0 in | Wt 172.0 lb

## 2016-04-27 DIAGNOSIS — J0111 Acute recurrent frontal sinusitis: Secondary | ICD-10-CM

## 2016-04-27 MED ORDER — ALBUTEROL SULFATE HFA 108 (90 BASE) MCG/ACT IN AERS: 2.0000 | INHALATION_SPRAY | Freq: Four times a day (QID) | RESPIRATORY_TRACT | 0 refills | Status: DC | PRN

## 2016-04-27 MED ORDER — PREDNISONE 20 MG PO TABS
20.0000 mg | ORAL_TABLET | Freq: Every day | ORAL | 0 refills | Status: DC
Start: 2016-04-27 — End: 2016-10-23

## 2016-04-27 MED ORDER — AZITHROMYCIN 250 MG PO TABS: ORAL_TABLET | ORAL | 0 refills | Status: DC

## 2016-04-27 MED ORDER — PROMETHAZINE-DM 6.25-15 MG/5ML PO SYRP: 5.0000 mL | ORAL_SOLUTION | Freq: Four times a day (QID) | ORAL | 0 refills | Status: DC | PRN

## 2016-04-27 NOTE — Progress Notes (Signed)
Subjective:     Daisy Robbins is a 55 y.o. female who presents for evaluation of symptoms of a URI, with cough and chest tightness. Symptoms include nasal congestion, nausea without vomiting, productive cough with  white colored sputum, shortness of breath and sore throat. Onset of symptoms was 3 days ago, and has been rapidly worsening since that time. Treatment to date: none.she is wheezing and using an old inhaler  The following portions of the patient's history were reviewed and updated as appropriate: past family history, past social history and past surgical history.  Review of Systems Pertinent items are noted in HPI.   Objective:    BP 136/82 (BP Location: Right Arm, Patient Position: Sitting, Cuff Size: Normal)   Pulse 72   Temp 98.1 F (36.7 C) (Oral)   Resp 20   Ht 5\' 3"  (1.6 m)   Wt 172 lb 0.6 oz (78 kg)   SpO2 97%   BMI 30.48 kg/m  General appearance: alert, cooperative, fatigued and mild distress Head: Normocephalic, without obvious abnormality, atraumatic Eyes: conjunctivae/corneas clear. PERRL, EOM's intact. Fundi benign. Ears: normal TM's and external ear canals both ears Nose: mild congestion, sinus tenderness bilateral Throat: normal findings: buccal mucosa normal and teeth intact, non-carious and abnormal findings: mild oropharyngeal erythema Neck: no adenopathy and thyroid not enlarged, symmetric, no tenderness/mass/nodules Back: symmetric, no curvature. ROM normal. No CVA tenderness. Lungs: wheezes bibasilar and faint, inpsiratory Heart: regular rate and rhythm, S1, S2 normal, no murmur, click, rub or gallop Abdomen: normal findings: soft, non-tender Extremities: extremities normal, atraumatic, no cyanosis or edema  Voice sounds strained/laryngitis Assessment:    bronchitis and viral upper respiratory illness   Plan:    Discussed diagnosis and treatment of URI. Suggested symptomatic OTC remedies. Nasal saline spray for congestion. Follow up as  needed.   Patient Instructions  Drink plenty of fluids  Use the inhaler if you feel your chest is tight or wheezing  Take the cough medicine as needed  Take the prednisone as directed twice a day Take the z pak as directed  Call if not better by Monday

## 2016-04-27 NOTE — Patient Instructions (Signed)
Drink plenty of fluids  Use the inhaler if you feel your chest is tight or wheezing  Take the cough medicine as needed  Take the prednisone as directed twice a day Take the z pak as directed  Call if not better by Monday

## 2016-05-04 ENCOUNTER — Encounter: Payer: Self-pay | Admitting: Family Medicine

## 2016-05-15 ENCOUNTER — Other Ambulatory Visit: Payer: Self-pay | Admitting: Family Medicine

## 2016-05-17 ENCOUNTER — Telehealth (HOSPITAL_COMMUNITY): Payer: Self-pay | Admitting: *Deleted

## 2016-05-17 NOTE — Telephone Encounter (Signed)
phone call to cell, no voice mail, phone call to home phone, line busy.

## 2016-05-18 ENCOUNTER — Telehealth (HOSPITAL_COMMUNITY): Payer: Self-pay | Admitting: *Deleted

## 2016-05-18 NOTE — Telephone Encounter (Signed)
left 2nd voice message at both phone numbers regarding an appointment

## 2016-10-11 ENCOUNTER — Other Ambulatory Visit: Payer: Self-pay | Admitting: Family Medicine

## 2016-10-23 ENCOUNTER — Other Ambulatory Visit: Payer: Self-pay | Admitting: Family Medicine

## 2016-10-25 ENCOUNTER — Telehealth: Payer: Self-pay

## 2016-10-25 DIAGNOSIS — E119 Type 2 diabetes mellitus without complications: Secondary | ICD-10-CM

## 2016-10-25 DIAGNOSIS — Z1159 Encounter for screening for other viral diseases: Secondary | ICD-10-CM | POA: Insufficient documentation

## 2016-10-25 DIAGNOSIS — E785 Hyperlipidemia, unspecified: Secondary | ICD-10-CM

## 2016-10-25 DIAGNOSIS — I10 Essential (primary) hypertension: Secondary | ICD-10-CM

## 2016-10-25 NOTE — Telephone Encounter (Signed)
labs ordered

## 2016-11-01 ENCOUNTER — Ambulatory Visit (INDEPENDENT_AMBULATORY_CARE_PROVIDER_SITE_OTHER): Payer: BLUE CROSS/BLUE SHIELD | Admitting: Family Medicine

## 2016-11-01 ENCOUNTER — Other Ambulatory Visit: Payer: Self-pay | Admitting: Family Medicine

## 2016-11-01 ENCOUNTER — Encounter: Payer: Self-pay | Admitting: Family Medicine

## 2016-11-01 VITALS — BP 112/74 | HR 67 | Resp 16 | Ht 63.0 in | Wt 177.1 lb

## 2016-11-01 DIAGNOSIS — F321 Major depressive disorder, single episode, moderate: Secondary | ICD-10-CM

## 2016-11-01 DIAGNOSIS — E119 Type 2 diabetes mellitus without complications: Secondary | ICD-10-CM

## 2016-11-01 DIAGNOSIS — R062 Wheezing: Secondary | ICD-10-CM

## 2016-11-01 DIAGNOSIS — I2510 Atherosclerotic heart disease of native coronary artery without angina pectoris: Secondary | ICD-10-CM

## 2016-11-01 DIAGNOSIS — I10 Essential (primary) hypertension: Secondary | ICD-10-CM

## 2016-11-01 DIAGNOSIS — E663 Overweight: Secondary | ICD-10-CM

## 2016-11-01 DIAGNOSIS — F418 Other specified anxiety disorders: Secondary | ICD-10-CM

## 2016-11-01 DIAGNOSIS — N951 Menopausal and female climacteric states: Secondary | ICD-10-CM | POA: Insufficient documentation

## 2016-11-01 LAB — CBC
HEMATOCRIT: 38.6 % (ref 35.0–45.0)
Hemoglobin: 12.3 g/dL (ref 11.7–15.5)
MCH: 23.2 pg — ABNORMAL LOW (ref 27.0–33.0)
MCHC: 31.9 g/dL — ABNORMAL LOW (ref 32.0–36.0)
MCV: 72.8 fL — ABNORMAL LOW (ref 80.0–100.0)
MPV: 11.1 fL (ref 7.5–12.5)
PLATELETS: 299 10*3/uL (ref 140–400)
RBC: 5.3 MIL/uL — ABNORMAL HIGH (ref 3.80–5.10)
RDW: 15.7 % — AB (ref 11.0–15.0)
WBC: 5.6 10*3/uL (ref 3.8–10.8)

## 2016-11-01 LAB — HEPATITIS C ANTIBODY

## 2016-11-01 LAB — HIV ANTIBODY (ROUTINE TESTING W REFLEX)

## 2016-11-01 MED ORDER — ALPRAZOLAM 0.25 MG PO TABS: ORAL_TABLET | ORAL | 0 refills | Status: DC

## 2016-11-01 MED ORDER — VENLAFAXINE HCL 37.5 MG PO TABS: ORAL_TABLET | ORAL | 4 refills | Status: DC

## 2016-11-01 NOTE — Assessment & Plan Note (Addendum)
Updated lab needed Daisy Robbins is reminded of the importance of commitment to daily physical activity for 30 minutes or more, as able and the need to limit carbohydrate intake to 30 to 60 grams per meal to help with blood sugar control.   The need to take medication as prescribed, test blood sugar as directed, and to call between visits if there is a concern that blood sugar is uncontrolled is also discussed.   Daisy Robbins is reminded of the importance of daily foot exam, annual eye examination, and good blood sugar, blood pressure and cholesterol control.  Diabetic Labs Latest Ref Rng & Units 02/25/2015 02/24/2015 01/22/2014 02/25/2013 03/02/2012  HbA1c <5.7 % - 7.7(H) 7.4(H) 6.8(H) -  Microalbumin <2.0 mg/dL 5.0(H) - 0.50 0.50 -  Micro/Creat Ratio 0.0 - 30.0 mg/g 28.6 - 8.3 3.0 -  Chol 125 - 200 mg/dL - 167 201(H) 180 -  HDL >=46 mg/dL - 82 95 76 -  Calc LDL <130 mg/dL - 68 91 84 -  Triglycerides <150 mg/dL - 84 74 98 -  Creatinine 0.50 - 1.05 mg/dL - 0.65 0.65 0.74 0.91   BP/Weight 11/01/2016 04/27/2016 02/22/2016 08/03/2015 02/25/2015 06/30/2014 06/20/7260  Systolic BP 035 597 416 384 536 468 032  Diastolic BP 74 82 80 78 82 70 82  Wt. (Lbs) 177.12 172.04 168 181.4 184 181 181  BMI 31.38 30.48 29.76 32.14 32.6 32.07 32.07   Foot/eye exam completion dates Latest Ref Rng & Units 11/01/2016 08/03/2015  Eye Exam No Retinopathy - -  Foot Form Completion - Done Done

## 2016-11-01 NOTE — Assessment & Plan Note (Signed)
Start low dose effexor, [place] commit to cool clothing as well as use of cold drinks and a fan

## 2016-11-01 NOTE — Assessment & Plan Note (Addendum)
Controlled, no change in medication POver 10 year history, has  Never had echo or cardiology eval , high risk of CAD based on co morbidities refer for cardiology eval DASH diet and commitment to daily physical activity for a minimum of 30 minutes discussed and encouraged, as a part of hypertension management. The importance of attaining a healthy weight is also discussed.  BP/Weight 11/01/2016 04/27/2016 02/22/2016 08/03/2015 02/25/2015 06/30/2014 01/21/7115  Systolic BP 579 038 333 832 919 166 060  Diastolic BP 74 82 80 78 82 70 82  Wt. (Lbs) 177.12 172.04 168 181.4 184 181 181  BMI 31.38 30.48 29.76 32.14 32.6 32.07 32.07

## 2016-11-01 NOTE — Assessment & Plan Note (Signed)
Improved as not in work environment as much following injury on the job

## 2016-11-01 NOTE — Progress Notes (Signed)
Daisy Robbins     MRN: [id number]      DOB: 1960-11-18   HPI Daisy Robbins is here for follow up and re-evaluation of chronic medical conditions, medication management and review of any available recent lab and radiology data.  Preventive health is updated, specifically  Cancer screening and Immunization.   The PT denies any adverse reactions to current medications since the last visit.   Accident on job Feb 4 , hit right knee and right shoulder, treated through ortho with workman;'s com-, in pT and on light duty  Keeping up with eye exams Still very stressed and overwhelmed because of Daisy Robbins's health challenges, she has tried to get an appt with therapy but has been unsuccessful, cries easily and is overwhelmed. Not testing blood sugar, thinks she should be as she is being maintained on prednisone C/o excess night sweats which disturb Daisy sleep, has chronic fatigue as a result and also increased anxiety  ROS Denies recent fever or chills. Denies sinus pressure, nasal congestion, ear pain or sore throat. Denies chest congestion, productive cough or wheezing. Denies chest pains, palpitations and leg swelling Denies abdominal pain, nausea, vomiting,diarrhea or constipation.   Denies dysuria, frequency, hesitancy or incontinence.  Denies headaches, seizures, numbness, or tingling. Denies skin break down or rash.   PE  BP 112/74   Pulse 67   Resp 16   Ht 5\' 3"  (1.6 m)   Wt 177 lb 1.9 oz (80.3 kg)   SpO2 96%   BMI 31.38 kg/m   Patient alert and oriented and in no cardiopulmonary distress.  HEENT: No facial asymmetry, EOMI,   oropharynx pink and moist.  Neck supple no JVD, no mass.  Chest: Clear to auscultation bilaterally.  CVS: S1, S2 no murmurs, no S3.Regular rate.  ABD: Soft non tender.   Ext: No edema  MS: Adequate ROM spine, , hips and knees.Reduced in right shoulder  Skin: Intact, no ulcerations or rash noted.  Psych: Good eye contact, normal affect. Memory  intact not anxious or depressed appearing.  CNS: CN 2-12 intact, power,  normal throughout.no focal deficits noted.   Assessment & Plan  ESSENTIAL HYPERTENSION, BENIGN Controlled, no change in medication POver 10 year history, has  Never had echo or cardiology eval , high risk of CAD based on co morbidities refer for cardiology eval DASH diet and commitment to daily physical activity for a minimum of 30 minutes discussed and encouraged, as a part of hypertension management. The importance of attaining a healthy weight is also discussed.  BP/Weight 11/01/2016 04/27/2016 02/22/2016 08/03/2015 02/25/2015 06/30/2014 3/71/6967  Systolic BP 893 810 175 102 585 277 824  Diastolic BP 74 82 80 78 82 70 82  Wt. (Lbs) 177.12 172.04 168 181.4 184 181 181  BMI 31.38 30.48 29.76 32.14 32.6 32.07 32.07       Diabetes mellitus without complication ([facility]) Updated lab needed Daisy Robbins is reminded of the importance of commitment to daily physical activity for 30 minutes or more, as able and the need to limit carbohydrate intake to 30 to 60 grams per meal to help with blood sugar control.   The need to take medication as prescribed, test blood sugar as directed, and to call between visits if there is a concern that blood sugar is uncontrolled is also discussed.   Daisy Robbins is reminded of the importance of daily foot exam, annual eye examination, and good blood sugar, blood pressure and cholesterol control.  Diabetic Labs Latest  Ref Rng & Units 02/25/2015 02/24/2015 01/22/2014 02/25/2013 03/02/2012  HbA1c <5.7 % - 7.7(H) 7.4(H) 6.8(H) -  Microalbumin <2.0 mg/dL 5.0(H) - 0.50 0.50 -  Micro/Creat Ratio 0.0 - 30.0 mg/g 28.6 - 8.3 3.0 -  Chol 125 - 200 mg/dL - 167 201(H) 180 -  HDL >=46 mg/dL - 82 95 76 -  Calc LDL <130 mg/dL - 68 91 84 -  Triglycerides <150 mg/dL - 84 74 98 -  Creatinine 0.50 - 1.05 mg/dL - 0.65 0.65 0.74 0.91   BP/Weight 11/01/2016 04/27/2016 02/22/2016 08/03/2015 02/25/2015 06/30/2014 0/81/4481    Systolic BP 856 314 970 263 785 885 027  Diastolic BP 74 82 80 78 82 70 82  Wt. (Lbs) 177.12 172.04 168 181.4 184 181 181  BMI 31.38 30.48 29.76 32.14 32.6 32.07 32.07   Foot/eye exam completion dates Latest Ref Rng & Units 11/01/2016 08/03/2015  Eye Exam No Retinopathy - -  Foot Form Completion - Done Done        Overweight (BMI 25.0-29.9) Deteriorated. Patient re-educated about  the importance of commitment to a  minimum of 150 minutes of exercise per week.  The importance of healthy food choices with portion control discussed. Encouraged to start a food diary, count calories and to consider  joining a support group. Sample diet sheets offered. Goals set by the patient for the next several months.   Weight /BMI 11/01/2016 04/27/2016 02/22/2016  WEIGHT 177 lb 1.9 oz 172 lb 0.6 oz 168 lb  HEIGHT 5\' 3"  5\' 3"  5\' 3"   BMI 31.38 kg/m2 30.48 kg/m2 29.76 kg/m2      Wheezing Improved as not in work environment as much following injury on the job  Hot flashes, menopausal Start low dose effexor, [place] commit to cool clothing as well as use of cold drinks and a fan  Depression Start effexor and refer to therapist

## 2016-11-01 NOTE — Assessment & Plan Note (Signed)
Start effexor and refer to therapist

## 2016-11-01 NOTE — Assessment & Plan Note (Signed)
Deteriorated. Patient re-educated about  the importance of commitment to a  minimum of 150 minutes of exercise per week.  The importance of healthy food choices with portion control discussed. Encouraged to start a food diary, count calories and to consider  joining a support group. Sample diet sheets offered. Goals set by the patient for the next several months.   Weight /BMI 11/01/2016 04/27/2016 02/22/2016  WEIGHT 177 lb 1.9 oz 172 lb 0.6 oz 168 lb  HEIGHT 5\' 3"  5\' 3"  5\' 3"   BMI 31.38 kg/m2 30.48 kg/m2 29.76 kg/m2

## 2016-11-01 NOTE — Patient Instructions (Addendum)
Annual physical exam end November, call if you need me before   Labs  will be added as discussed  Good foot exam You are being referred to cardiology for evaluation for vascular disease due to longstanding hypertension, and type 2 diabetes  Please sched mammogram  Pls go next door to get appt with therapist  All the best   New for hot flashes and to help with mild anxiety an depression is effexor, also 2 xanax sent only  For days of air travel  Thank you  for choosing [facility]. We consider it a privelige to serve you.  Delivering excellent health care in a caring and  compassionate way is our goal.  Partnering with you,  so that together we can achieve this goal is our strategy.

## 2016-11-02 LAB — COMPLETE METABOLIC PANEL WITH GFR
ALBUMIN: 4.1 g/dL (ref 3.6–5.1)
ALT: 21 U/L (ref 6–29)
AST: 18 U/L (ref 10–35)
Alkaline Phosphatase: 111 U/L (ref 33–130)
BUN: 11 mg/dL (ref 7–25)
CALCIUM: 10 mg/dL (ref 8.6–10.4)
CHLORIDE: 101 mmol/L (ref 98–110)
CO2: 27 mmol/L (ref 20–31)
Creat: 0.78 mg/dL (ref 0.50–1.05)
GFR, Est African American: >89 mL/min (ref >=60)
GFR, Est Non African American: 85 mL/min (ref >=60)
GLUCOSE: 168 mg/dL — AB (ref 65–99)
POTASSIUM: 4.3 mmol/L (ref 3.5–5.3)
SODIUM: 141 mmol/L (ref 135–146)
Total Bilirubin: 0.3 mg/dL (ref 0.2–1.2)
Total Protein: 7 g/dL (ref 6.1–8.1)

## 2016-11-02 LAB — LIPID PANEL
CHOL/HDL RATIO: 2.3 ratio (ref <5.0)
CHOLESTEROL: 196 mg/dL (ref <200)
HDL: 86 mg/dL (ref >50)
LDL Cholesterol: 94 mg/dL (ref <100)
Triglycerides: 78 mg/dL (ref <150)
VLDL: 16 mg/dL (ref <30)

## 2016-11-02 LAB — VITAMIN D 25 HYDROXY (VIT D DEFICIENCY, FRACTURES): Vit D, 25-Hydroxy: 19 ng/mL — ABNORMAL LOW (ref 30–100)

## 2016-11-02 LAB — HEMOGLOBIN A1C
Hgb A1c MFr Bld: 8 % — ABNORMAL HIGH (ref <5.7)
MEAN PLASMA GLUCOSE: 183 mg/dL

## 2016-11-02 LAB — TSH: TSH: 0.93 mIU/L

## 2016-11-02 LAB — HIV ANTIBODY (ROUTINE TESTING W REFLEX)

## 2016-11-02 LAB — MICROALBUMIN / CREATININE URINE RATIO
Creatinine, Urine: 159 mg/dL (ref 20–320)
Microalb Creat Ratio: 3 mcg/mg creat (ref <30)
Microalb, Ur: 0.4 mg/dL

## 2016-11-02 LAB — HEPATITIS C ANTIBODY

## 2016-11-03 ENCOUNTER — Other Ambulatory Visit: Payer: Self-pay | Admitting: Family Medicine

## 2016-11-04 ENCOUNTER — Other Ambulatory Visit: Payer: Self-pay

## 2016-11-04 MED ORDER — SITAGLIP PHOS-METFORMIN HCL ER 50-1000 MG PO TB24: 2.0000 | ORAL_TABLET | Freq: Every day | ORAL | 2 refills | Status: DC

## 2016-11-10 ENCOUNTER — Other Ambulatory Visit: Payer: Self-pay | Admitting: Family Medicine

## 2016-11-10 ENCOUNTER — Telehealth: Payer: Self-pay | Admitting: Family Medicine

## 2016-11-10 ENCOUNTER — Other Ambulatory Visit: Payer: Self-pay

## 2016-11-10 MED ORDER — SITAGLIP PHOS-METFORMIN HCL ER 50-1000 MG PO TB24: ORAL_TABLET | ORAL | 3 refills | Status: DC

## 2016-11-10 NOTE — Telephone Encounter (Signed)
Spoke wih pt and generic dAW ordered and coupon advised also 3 month supply

## 2016-11-10 NOTE — Telephone Encounter (Signed)
Patient would like to discuss the Janumet Rx.  She states that it is way to expensive and asking if there is something else she can take.

## 2016-11-15 ENCOUNTER — Telehealth (HOSPITAL_COMMUNITY): Payer: Self-pay | Admitting: *Deleted

## 2016-11-15 NOTE — Telephone Encounter (Signed)
spoke with patient, she will call tomorrow with her insurance information.

## 2016-12-08 ENCOUNTER — Other Ambulatory Visit: Payer: Self-pay

## 2016-12-08 ENCOUNTER — Other Ambulatory Visit: Payer: Self-pay | Admitting: Family Medicine

## 2016-12-08 MED ORDER — VENLAFAXINE HCL 37.5 MG PO TABS: ORAL_TABLET | ORAL | 4 refills | Status: DC

## 2016-12-08 NOTE — Progress Notes (Deleted)
Psychiatric Initial Adult Assessment   Patient Identification: Daisy Robbins MRN:  [id number] Date of Evaluation:  12/08/2016 Referral Source: Fayrene Helper, MD Chief Complaint:   Visit Diagnosis: No diagnosis found.  History of Present Illness:   Daisy Robbins is a 56 year old female with depression, diabetes, who is referred for depression.     Associated Signs/Symptoms: Depression Symptoms:  {DEPRESSION SYMPTOMS:20000} (Hypo) Manic Symptoms:  {BHH MANIC SYMPTOMS:22872} Anxiety Symptoms:  {BHH ANXIETY SYMPTOMS:22873} Psychotic Symptoms:  {BHH PSYCHOTIC SYMPTOMS:22874} PTSD Symptoms: {BHH PTSD SYMPTOMS:22875}  Past Psychiatric History:  Outpatient:  Psychiatry admission:  Previous suicide attempt:  Past trials of medication:  History of violence:   Previous Psychotropic Medications: Yes  Substance Abuse History in the last 12 months:  No.  Consequences of Substance Abuse: NA  Past Medical History:  Past Medical History:  Diagnosis Date  . Allergy   . Diabetes mellitus   . Hypertension     Past Surgical History:  Procedure Laterality Date  . ABDOMINAL HYSTERECTOMY    . CESAREAN SECTION    . COLONOSCOPY  03/01/2012   Procedure: COLONOSCOPY;  Surgeon: Rogene Houston, MD;  Location: [facility];  Service: Endoscopy;  Laterality: N/A;  950  . right carpal tunnel release      Family Psychiatric History: ***  Family History:  Family History  Problem Relation Age of Onset  . Hypertension Mother   . Cancer Father        colon   . Diabetes Sister   . Colon cancer Neg Hx     Social History:   Social History   Social History  . Marital status: Married    Spouse name: N/A  . Number of children: N/A  . Years of education: N/A   Social History Main Topics  . Smoking status: Never Smoker  . Smokeless tobacco: Never Used  . Alcohol use No  . Drug use: No  . Sexual activity: Not on file   Other Topics Concern  . Not on file   Social History  Narrative  . No narrative on file    Additional Social History: ***  Allergies:  No Known Allergies  Metabolic Disorder Labs: Lab Results  Component Value Date   HGBA1C 8.0 (H) 11/01/2016   MPG 183 11/01/2016   MPG 174 (H) 02/24/2015   No results found for: PROLACTIN Lab Results  Component Value Date   CHOL 196 11/01/2016   TRIG 78 11/01/2016   HDL 86 11/01/2016   CHOLHDL 2.3 11/01/2016   VLDL 16 11/01/2016   LDLCALC 94 11/01/2016   LDLCALC 68 02/24/2015     Current Medications: Current Outpatient Prescriptions  Medication Sig Dispense Refill  . albuterol (PROVENTIL HFA;VENTOLIN HFA) 108 (90 Base) MCG/ACT inhaler Inhale 2 puffs into the lungs every 6 (six) hours as needed for wheezing or shortness of breath. 1 Inhaler 0  . ALPRAZolam (XANAX) 0.25 MG tablet One tablet day of travel 2 tablet 0  . benazepril-hydrochlorthiazide (LOTENSIN HCT) 20-12.5 MG tablet TAKE 1 & 1/2 TABLETS BY DAILY 135 tablet 1  . mometasone (NASONEX) 50 MCG/ACT nasal spray Place 2 sprays into the nose daily. 17 g 12  . montelukast (SINGULAIR) 10 MG tablet Take 1 tablet (10 mg total) by mouth at bedtime. 30 tablet 5  . SitaGLIPtin-MetFORMIN HCl 50-1000 MG TB24 Two tablets once daily 180 tablet 3  . venlafaxine (EFFEXOR) 37.5 MG tablet One tablet once daily 30 tablet 4   No current facility-administered  medications for this visit.     Neurologic: Headache: No Seizure: No Paresthesias:No  Musculoskeletal: Strength & Muscle Tone: within normal limits Gait & Station: normal Patient leans: N/A  Psychiatric Specialty Exam: ROS  There were no vitals taken for this visit.There is no height or weight on file to calculate BMI.  General Appearance: Fairly Groomed  Eye Contact:  Good  Speech:  Clear and Coherent  Volume:  Normal  Mood:  {BHH MOOD:22306}  Affect:  {Affect (PAA):22687}  Thought Process:  Coherent and Goal Directed  Orientation:  Full (Time, Place, and Person)  Thought Content:   Logical  Suicidal Thoughts:  {ST/HT (PAA):22692}  Homicidal Thoughts:  {ST/HT (PAA):22692}  Memory:  Immediate;   Good Recent;   Good Remote;   Good  Judgement:  {Judgement (PAA):22694}  Insight:  {Insight (PAA):22695}  Psychomotor Activity:  Normal  Concentration:  Concentration: Good and Attention Span: Good  Recall:  Good  Fund of Knowledge:Good  Language: Good  Akathisia:  No  Handed:  Right  AIMS (if indicated):  N/A  Assets:  Communication Skills Desire for Improvement  ADL's:  Intact  Cognition: WNL  Sleep:  ***   Assessment  Plan  The patient demonstrates the following risk factors for suicide: Chronic risk factors for suicide include: {Chronic Risk Factors for JZPHXTA:56979480}. Acute risk factors for suicide include: {Acute Risk Factors for XKPVVZS:82707867}. Protective factors for this patient include: {Protective Factors for Suicide JQGB:20100712}. Considering these factors, the overall suicide risk at this point appears to be {Desc; low/moderate/high:110033}. Patient {ACTION; IS/IS RFX:58832549} appropriate for outpatient follow up.   Treatment Plan Summary: Plan as above   Norman Clay, MD 7/5/20182:30 PM

## 2016-12-14 ENCOUNTER — Ambulatory Visit (HOSPITAL_COMMUNITY): Payer: Self-pay | Admitting: Psychiatry

## 2016-12-22 ENCOUNTER — Other Ambulatory Visit: Payer: Self-pay

## 2016-12-22 MED ORDER — VENLAFAXINE HCL 37.5 MG PO TABS: ORAL_TABLET | ORAL | 0 refills | Status: DC

## 2017-03-06 ENCOUNTER — Telehealth: Payer: Self-pay | Admitting: Family Medicine

## 2017-03-06 DIAGNOSIS — E119 Type 2 diabetes mellitus without complications: Secondary | ICD-10-CM

## 2017-03-06 DIAGNOSIS — I10 Essential (primary) hypertension: Secondary | ICD-10-CM

## 2017-03-06 NOTE — Telephone Encounter (Signed)
Patient is requesting the Rx Cough Syrup that Dr. Moshe Cipro calls in for her about this time every year.      [facility] [place]   Pt's cb  [phone]

## 2017-03-08 ENCOUNTER — Other Ambulatory Visit: Payer: Self-pay | Admitting: Family Medicine

## 2017-03-08 ENCOUNTER — Telehealth: Payer: Self-pay | Admitting: Family Medicine

## 2017-03-08 MED ORDER — PROMETHAZINE-DM 6.25-15 MG/5ML PO SYRP: ORAL_SOLUTION | ORAL | 0 refills | Status: DC

## 2017-03-08 NOTE — Telephone Encounter (Signed)
Patient scheduled for appt Nov 12 @ 10 w/Dr. Moshe Cipro.  Please note lab order by Dr. Moshe Cipro with today's note.

## 2017-03-08 NOTE — Telephone Encounter (Signed)
[facility]ication [facility], she needs an appointment in November early and labs done prior to appt   Please mail this info to her if unable to get her on the tele  Blood sugar uncontrolled when l;ast here and no f/u in system, another message on way with labs needed

## 2017-03-08 NOTE — Telephone Encounter (Signed)
Letter mailed

## 2017-03-08 NOTE — Telephone Encounter (Signed)
Labs needed are non fast hBA1C , chem 7 and eGFR , also her mammogram is 2 years past due , she can call and schedule this herself, needs this

## 2017-03-08 NOTE — Telephone Encounter (Signed)
Mailed

## 2017-04-03 ENCOUNTER — Telehealth: Payer: Self-pay | Admitting: *Deleted

## 2017-04-03 ENCOUNTER — Other Ambulatory Visit: Payer: Self-pay | Admitting: Family Medicine

## 2017-04-03 MED ORDER — BENAZEPRIL-HYDROCHLOROTHIAZIDE 20-12.5 MG PO TABS: ORAL_TABLET | ORAL | 1 refills | Status: DC

## 2017-04-03 NOTE — Telephone Encounter (Signed)
Patient called stating she needs her blood pressure medication refilled, patient states the pharmacy has sent over 2 request and she really needs this refilled because [place]. Please advise

## 2017-04-03 NOTE — Addendum Note (Signed)
Addended by: Merceda Elks on: 04/03/2017 02:51 PM   Modules accepted: Orders

## 2017-04-03 NOTE — Telephone Encounter (Signed)
See phone note

## 2017-04-03 NOTE — Telephone Encounter (Signed)
I called patient to make her aware that her medication was refilled, no answer I left a detailed message.

## 2017-04-04 ENCOUNTER — Other Ambulatory Visit: Payer: Self-pay

## 2017-04-04 ENCOUNTER — Telehealth: Payer: Self-pay | Admitting: Family Medicine

## 2017-04-04 ENCOUNTER — Other Ambulatory Visit: Payer: Self-pay | Admitting: Family Medicine

## 2017-04-04 MED ORDER — BENAZEPRIL HCL 20 MG PO TABS
20.0000 mg | ORAL_TABLET | Freq: Every day | ORAL | 0 refills | Status: DC
Start: 2017-04-04 — End: 2017-07-27

## 2017-04-04 MED ORDER — HYDROCHLOROTHIAZIDE 25 MG PO TABS: ORAL_TABLET | ORAL | 0 refills | Status: DC

## 2017-04-04 NOTE — Telephone Encounter (Signed)
Message left on mobile telephone number that medication has been sent in to patient's p[facility]

## 2017-04-05 NOTE — Telephone Encounter (Signed)
Patient called to tell Dr Moshe Cipro thank you so much for getting her blood pressure medication called in.

## 2017-04-17 ENCOUNTER — Ambulatory Visit: Payer: Self-pay | Admitting: Family Medicine

## 2017-04-18 ENCOUNTER — Encounter: Payer: Self-pay | Admitting: Family Medicine

## 2017-04-18 ENCOUNTER — Ambulatory Visit (INDEPENDENT_AMBULATORY_CARE_PROVIDER_SITE_OTHER): Payer: Self-pay | Admitting: Family Medicine

## 2017-04-18 VITALS — BP 140/80 | HR 72 | Resp 16 | Ht 63.0 in | Wt 182.0 lb

## 2017-04-18 DIAGNOSIS — R05 Cough: Secondary | ICD-10-CM

## 2017-04-19 LAB — COMPLETE METABOLIC PANEL WITH GFR
AG Ratio: 1.4 (calc) (ref 1.0–2.5)
ALBUMIN MSPROF: 4.1 g/dL (ref 3.6–5.1)
ALT: 18 U/L (ref 6–29)
AST: 19 U/L (ref 10–35)
Alkaline phosphatase (APISO): 92 U/L (ref 33–130)
BUN: 10 mg/dL (ref 7–25)
CALCIUM: 9.8 mg/dL (ref 8.6–10.4)
CO2: 35 mmol/L — AB (ref 20–32)
CREATININE: 0.73 mg/dL (ref 0.50–1.05)
Chloride: 99 mmol/L (ref 98–110)
GFR, EST AFRICAN AMERICAN: 106 mL/min/{1.73_m2} (ref >=60)
GFR, EST NON AFRICAN AMERICAN: 91 mL/min/{1.73_m2} (ref >=60)
GLOBULIN: 2.9 g/dL (ref 1.9–3.7)
Glucose, Bld: 147 mg/dL — ABNORMAL HIGH (ref 65–99)
Potassium: 4 mmol/L (ref 3.5–5.3)
SODIUM: 142 mmol/L (ref 135–146)
Total Bilirubin: 0.4 mg/dL (ref 0.2–1.2)
Total Protein: 7 g/dL (ref 6.1–8.1)

## 2017-04-19 LAB — HEMOGLOBIN A1C
EAG (MMOL/L): 9 (calc)
Hgb A1c MFr Bld: 7.3 % of total Hgb — ABNORMAL HIGH (ref <5.7)
MEAN PLASMA GLUCOSE: 163 (calc)

## 2017-05-01 ENCOUNTER — Encounter: Payer: Self-pay | Admitting: Family Medicine

## 2017-05-01 ENCOUNTER — Ambulatory Visit: Payer: BLUE CROSS/BLUE SHIELD | Admitting: Family Medicine

## 2017-05-01 VITALS — BP 120/76 | HR 84 | Resp 16 | Ht 63.0 in | Wt 182.0 lb

## 2017-05-01 DIAGNOSIS — E663 Overweight: Secondary | ICD-10-CM

## 2017-05-01 DIAGNOSIS — E119 Type 2 diabetes mellitus without complications: Secondary | ICD-10-CM

## 2017-05-01 DIAGNOSIS — Z1231 Encounter for screening mammogram for malignant neoplasm of breast: Secondary | ICD-10-CM | POA: Diagnosis not present

## 2017-05-01 DIAGNOSIS — I10 Essential (primary) hypertension: Secondary | ICD-10-CM | POA: Diagnosis not present

## 2017-05-01 DIAGNOSIS — N951 Menopausal and female climacteric states: Secondary | ICD-10-CM

## 2017-05-01 DIAGNOSIS — J209 Acute bronchitis, unspecified: Secondary | ICD-10-CM | POA: Diagnosis not present

## 2017-05-01 MED ORDER — AZITHROMYCIN 250 MG PO TABS: ORAL_TABLET | ORAL | 0 refills | Status: DC

## 2017-05-01 MED ORDER — BENZONATATE 100 MG PO CAPS: 100.0000 mg | ORAL_CAPSULE | Freq: Two times a day (BID) | ORAL | 0 refills | Status: DC | PRN

## 2017-05-01 NOTE — Patient Instructions (Addendum)
Follow up with rectal in 4.5 months, call if you  neeed me sooner  Pls get appt for mammogram at checkout, this week if possible  Non fasting cmp and EGFr, hBA1C 2 days before visit  congrats on improved labs and good blood pressure  Two meds sent for bronchitis  Cough med will be refilled  Need to stop drinking sugar, sodas and sweet tea  It is important that you exercise regularly at least 30 minutes 5 times a week. If you develop chest pain, have severe difficulty breathing, or feel very tired, stop exercising immediately and seek medical attention

## 2017-05-01 NOTE — Progress Notes (Signed)
Daisy Robbins     MRN: [id number]      DOB: 1960-06-23   HPI Daisy Robbins is here for follow up and re-evaluation of chronic medical conditions, medication management and review of any available recent lab and radiology data.  Preventive health is updated, specifically  Cancer screening and Immunization.   Questions or concerns regarding consultations or procedures which the PT has had in the interim are  addressed. The PT denies any adverse reactions to current medications since the last visit.  3 week h/o cough and chest congestion, no fever or chills ROS Denies recent fever or chills. Denies sinus pressure, nasal congestion, ear pain or sore throat. . Denies chest pains, palpitations and leg swelling Denies abdominal pain, nausea, vomiting,diarrhea or constipation.   Denies dysuria, frequency, hesitancy or incontinence. Denies joint pain, swelling and limitation in mobility. Denies headaches, seizures, numbness, or tingling. Denies depression, anxiety or insomnia. Denies skin break down or rash.   PE  BP 120/76   Pulse 84   Resp 16   Ht 5\' 3"  (1.6 m)   Wt 182 lb (82.6 kg)   SpO2 96%   BMI 32.24 kg/m   Patient alert and oriented and in no cardiopulmonary distress.  HEENT: No facial asymmetry, EOMI,   oropharynx pink and moist.  Neck supple no JVD, no mass.  Chest: Adequate air entry , scattered crackles, no wheezes.  CVS: S1, S2 no murmurs, no S3.Regular rate.  ABD: Soft non tender.   Ext: No edema  MS: Adequate ROM spine, shoulders, hips and knees.  Skin: Intact, no ulcerations or rash noted.  Psych: Good eye contact, normal affect. Memory intact not anxious or depressed appearing.  CNS: CN 2-12 intact, power,  normal throughout.no focal deficits noted.   Assessment & Plan  ESSENTIAL HYPERTENSION, BENIGN Controlled, no change in medication DASH diet and commitment to daily physical activity for a minimum of 30 minutes discussed and encouraged, as a part of  hypertension management. The importance of attaining a healthy weight is also discussed.  BP/Weight 05/01/2017 04/18/2017 11/01/2016 04/27/2016 02/22/2016 08/03/2015 1/51/7616  Systolic BP 073 710 626 948 546 270 350  Diastolic BP 76 80 74 82 80 78 82  Wt. (Lbs) 182 182 177.12 172.04 168 181.4 184  BMI 32.24 32.24 31.38 30.48 29.76 32.14 32.6       Diabetes mellitus without complication (HCC) Improved, no change in medication Daisy Robbins is reminded of the importance of commitment to daily physical activity for 30 minutes or more, as able and the need to limit carbohydrate intake to 30 to 60 grams per meal to help with blood sugar control.   The need to take medication as prescribed, test blood sugar as directed, and to call between visits if there is a concern that blood sugar is uncontrolled is also discussed.   Daisy Robbins is reminded of the importance of daily foot exam, annual eye examination, and good blood sugar, blood pressure and cholesterol control.  Diabetic Labs Latest Ref Rng & Units 04/18/2017 11/01/2016 02/25/2015 02/24/2015 01/22/2014  HbA1c <5.7 % of total Hgb 7.3(H) 8.0(H) - 7.7(H) 7.4(H)  Microalbumin Not estab mg/dL - 0.4 5.0(H) - 0.50  Micro/Creat Ratio <30 mcg/mg creat - 3 28.6 - 8.3  Chol <200 mg/dL - 196 - 167 201(H)  HDL >50 mg/dL - 86 - 82 95  Calc LDL <100 mg/dL - 94 - 68 91  Triglycerides <150 mg/dL - 78 - 84 74  Creatinine 0.50 -  1.05 mg/dL 0.73 0.78 - 0.65 0.65   BP/Weight 05/01/2017 04/18/2017 11/01/2016 04/27/2016 02/22/2016 08/03/2015 2/84/1324  Systolic BP 401 027 253 664 403 474 259  Diastolic BP 76 80 74 82 80 78 82  Wt. (Lbs) 182 182 177.12 172.04 168 181.4 184  BMI 32.24 32.24 31.38 30.48 29.76 32.14 32.6   Foot/eye exam completion dates Latest Ref Rng & Units 11/01/2016 08/03/2015  Eye Exam No Retinopathy - -  Foot Form Completion - Done Done        Acute bronchitis Decongestant and antibiotic prescribed  Hot flashes, menopausal Improved on  medication, continue same  Overweight (BMI 25.0-29.9) Deteriorated. Patient re-educated about  the importance of commitment to a  minimum of 150 minutes of exercise per week.  The importance of healthy food choices with portion control discussed. Encouraged to start a food diary, count calories and to consider  joining a support group. Sample diet sheets offered. Goals set by the patient for the next several months.   Weight /BMI 05/01/2017 04/18/2017 11/01/2016  WEIGHT 182 lb 182 lb 177 lb 1.9 oz  HEIGHT 5\' 3"  5\' 3"  5\' 3"   BMI 32.24 kg/m2 32.24 kg/m2 31.38 kg/m2

## 2017-05-04 ENCOUNTER — Ambulatory Visit (HOSPITAL_COMMUNITY)
Admission: RE | Admit: 2017-05-04 | Discharge: 2017-05-04 | Disposition: A | Discharge status: Home / Self Care | Payer: BLUE CROSS/BLUE SHIELD | Source: Ambulatory Visit | Attending: Family Medicine | Admitting: Family Medicine

## 2017-05-04 ENCOUNTER — Encounter (HOSPITAL_COMMUNITY): Payer: Self-pay

## 2017-05-04 DIAGNOSIS — Z1231 Encounter for screening mammogram for malignant neoplasm of breast: Secondary | ICD-10-CM | POA: Insufficient documentation

## 2017-05-05 ENCOUNTER — Encounter: Payer: Self-pay | Admitting: Family Medicine

## 2017-05-05 NOTE — Assessment & Plan Note (Addendum)
Improved, no change in medication Daisy Robbins is reminded of the importance of commitment to daily physical activity for 30 minutes or more, as able and the need to limit carbohydrate intake to 30 to 60 grams per meal to help with blood sugar control.   The need to take medication as prescribed, test blood sugar as directed, and to call between visits if there is a concern that blood sugar is uncontrolled is also discussed.   Daisy Robbins is reminded of the importance of daily foot exam, annual eye examination, and good blood sugar, blood pressure and cholesterol control.  Diabetic Labs Latest Ref Rng & Units 04/18/2017 11/01/2016 02/25/2015 02/24/2015 01/22/2014  HbA1c <5.7 % of total Hgb 7.3(H) 8.0(H) - 7.7(H) 7.4(H)  Microalbumin Not estab mg/dL - 0.4 5.0(H) - 0.50  Micro/Creat Ratio <30 mcg/mg creat - 3 28.6 - 8.3  Chol <200 mg/dL - 196 - 167 201(H)  HDL >50 mg/dL - 86 - 82 95  Calc LDL <100 mg/dL - 94 - 68 91  Triglycerides <150 mg/dL - 78 - 84 74  Creatinine 0.50 - 1.05 mg/dL 0.73 0.78 - 0.65 0.65   BP/Weight 05/01/2017 04/18/2017 11/01/2016 04/27/2016 02/22/2016 08/03/2015 2/94/7654  Systolic BP 650 354 656 812 751 700 174  Diastolic BP 76 80 74 82 80 78 82  Wt. (Lbs) 182 182 177.12 172.04 168 181.4 184  BMI 32.24 32.24 31.38 30.48 29.76 32.14 32.6   Foot/eye exam completion dates Latest Ref Rng & Units 11/01/2016 08/03/2015  Eye Exam No Retinopathy - -  Foot Form Completion - Done Done

## 2017-05-05 NOTE — Progress Notes (Signed)
Pt left without being seen , so appointment is cancelled, no charge

## 2017-05-05 NOTE — Assessment & Plan Note (Signed)
Controlled, no change in medication DASH diet and commitment to daily physical activity for a minimum of 30 minutes discussed and encouraged, as a part of hypertension management. The importance of attaining a healthy weight is also discussed.  BP/Weight 05/01/2017 04/18/2017 11/01/2016 04/27/2016 02/22/2016 08/03/2015 0/02/2329  Systolic BP 076 226 333 545 625 638 937  Diastolic BP 76 80 74 82 80 78 82  Wt. (Lbs) 182 182 177.12 172.04 168 181.4 184  BMI 32.24 32.24 31.38 30.48 29.76 32.14 32.6

## 2017-05-05 NOTE — Assessment & Plan Note (Signed)
Deteriorated. Patient re-educated about  the importance of commitment to a  minimum of 150 minutes of exercise per week.  The importance of healthy food choices with portion control discussed. Encouraged to start a food diary, count calories and to consider  joining a support group. Sample diet sheets offered. Goals set by the patient for the next several months.   Weight /BMI 05/01/2017 04/18/2017 11/01/2016  WEIGHT 182 lb 182 lb 177 lb 1.9 oz  HEIGHT 5\' 3"  5\' 3"  5\' 3"   BMI 32.24 kg/m2 32.24 kg/m2 31.38 kg/m2

## 2017-05-05 NOTE — Assessment & Plan Note (Signed)
Improved on medication , continue same 

## 2017-05-05 NOTE — Assessment & Plan Note (Signed)
Decongestant and antibiotic prescribed 

## 2017-06-01 ENCOUNTER — Telehealth: Payer: Self-pay | Admitting: Family Medicine

## 2017-06-01 NOTE — Telephone Encounter (Signed)
Patient is calling advising that she has a Upper Resp infection and needs some meds  [phone] or Home [phone]

## 2017-06-02 ENCOUNTER — Ambulatory Visit: Payer: Self-pay | Admitting: Family Medicine

## 2017-06-02 NOTE — Telephone Encounter (Signed)
Cough x 2 weeks, wheezing and some congestion, advised robitussin dm. And advised that if she became SOB to go to the urgent care. She understands

## 2017-06-09 ENCOUNTER — Telehealth: Payer: Self-pay | Admitting: Family Medicine

## 2017-06-09 MED ORDER — FLUCONAZOLE 150 MG PO TABS: 150.0000 mg | ORAL_TABLET | Freq: Once | ORAL | 0 refills | Status: AC

## 2017-06-09 NOTE — Telephone Encounter (Signed)
Pt went to Urgent care- as from [place] has developed a Yeast Infection... Can you call her in something?

## 2017-06-09 NOTE — Telephone Encounter (Signed)
Med sent.

## 2017-06-09 NOTE — Telephone Encounter (Signed)
Itch, irritation and some discharge from recent antibiotic. Will send diflucan per md standing order

## 2017-07-19 ENCOUNTER — Telehealth: Payer: Self-pay | Admitting: Family Medicine

## 2017-07-19 NOTE — Telephone Encounter (Signed)
Yes, needs evaluation

## 2017-07-19 NOTE — Telephone Encounter (Signed)
Patient states her breathing is off and she is wheezing. Feels like she is using her inhaler to often. Ok to schedule with Meda Coffee ?

## 2017-07-20 NOTE — Telephone Encounter (Signed)
Okay to schedule for an opening tomorrow  ?

## 2017-07-20 NOTE — Telephone Encounter (Signed)
NO ANSWER, LVM

## 2017-07-20 NOTE — Telephone Encounter (Signed)
Put her in for tomorrow, thanks

## 2017-07-27 ENCOUNTER — Encounter: Payer: Self-pay | Admitting: Family Medicine

## 2017-07-27 ENCOUNTER — Ambulatory Visit: Payer: BLUE CROSS/BLUE SHIELD | Admitting: Family Medicine

## 2017-07-27 ENCOUNTER — Ambulatory Visit: Payer: Self-pay | Admitting: Family Medicine

## 2017-07-27 VITALS — BP 142/68 | HR 56 | Temp 99.0°F | Resp 20 | Ht 63.0 in | Wt 177.8 lb

## 2017-07-27 DIAGNOSIS — R062 Wheezing: Secondary | ICD-10-CM | POA: Diagnosis not present

## 2017-07-27 DIAGNOSIS — J209 Acute bronchitis, unspecified: Secondary | ICD-10-CM

## 2017-07-27 MED ORDER — PROMETHAZINE-DM 6.25-15 MG/5ML PO SYRP: 5.0000 mL | ORAL_SOLUTION | Freq: Four times a day (QID) | ORAL | 1 refills | Status: DC | PRN

## 2017-07-27 MED ORDER — FLUCONAZOLE 150 MG PO TABS: 150.0000 mg | ORAL_TABLET | Freq: Once | ORAL | 1 refills | Status: AC

## 2017-07-27 MED ORDER — IPRATROPIUM-ALBUTEROL 0.5-2.5 (3) MG/3ML IN SOLN: 3.0000 mL | Freq: Four times a day (QID) | RESPIRATORY_TRACT | Status: DC

## 2017-07-27 MED ORDER — AMOXICILLIN-POT CLAVULANATE 875-125 MG PO TABS: 1.0000 | ORAL_TABLET | Freq: Two times a day (BID) | ORAL | 0 refills | Status: DC

## 2017-07-27 MED ORDER — PREDNISONE 20 MG PO TABS: 20.0000 mg | ORAL_TABLET | Freq: Two times a day (BID) | ORAL | 0 refills | Status: DC

## 2017-07-27 NOTE — Patient Instructions (Addendum)
Take the antibiotic 2 X a day Take the prednisone 2 X a day Call if this does not help Cough medicine ordered Diflucan sent in case needed

## 2017-07-27 NOTE — Progress Notes (Signed)
Chief Complaint  Patient presents with  . Nasal Congestion  . Cough  . Wheezing   Patient is here for upper respiratory infection.  She states is been going on for a month.  A month ago she had a cough and some wheezing so was seen by Dr. [place] Crane at his urgent care clinic.  He treated her with a Z-Pak and a few days of prednisone.  She got a little bit better, but continued to cough.  She continues to wheeze.  Initially she was using her albuterol once or twice a day.  She now needs it every 6 hours in order to feel like she can breathe.  She is coughing up yellow phlegm.  She feels tired.  Her chest is sore.  No fever or chills.  No runny or stuffy nose remains.  No nausea vomiting or diarrhea.  She states that she does not have asthma, but she does wheeze every time she gets an upper respiratory infection.   Patient Active Problem List   Diagnosis Date Noted  . Hot flashes, menopausal 11/01/2016  . Hyperlipidemia LDL goal <100 01/26/2014  . Wheezing 01/20/2014  . Acute bronchitis 03/19/2013  . Allergic rhinitis 03/14/2009  . GLAUCOMA 10/28/2008  . Diabetes mellitus without complication ([place]) 62/95/2841  . Overweight (BMI 25.0-29.9) 07/02/2007  . ESSENTIAL HYPERTENSION, BENIGN 07/02/2007    Outpatient Encounter Medications as of 07/27/2017  Medication Sig  . albuterol (PROVENTIL HFA;VENTOLIN HFA) 108 (90 Base) MCG/ACT inhaler Inhale 2 puffs into the lungs every 6 (six) hours as needed for wheezing or shortness of breath.  . benazepril-hydrochlorthiazide (LOTENSIN HCT) 20-12.5 MG tablet   . SitaGLIPtin-MetFORMIN HCl 50-1000 MG TB24 Two tablets once daily  . ALPRAZolam (XANAX) 0.25 MG tablet One tablet day of travel (Patient not taking: Reported on 07/27/2017)  . amoxicillin-clavulanate (AUGMENTIN) 875-125 MG tablet Take 1 tablet by mouth 2 (two) times daily.  . fluconazole (DIFLUCAN) 150 MG tablet Take 1 tablet (150 mg total) by mouth once for 1 dose.  . montelukast (SINGULAIR) 10 MG  tablet Take 1 tablet (10 mg total) by mouth at bedtime. (Patient not taking: Reported on 07/27/2017)  . predniSONE (DELTASONE) 20 MG tablet Take 1 tablet (20 mg total) by mouth 2 (two) times daily with a meal.  . promethazine-dextromethorphan (PROMETHAZINE-DM) 6.25-15 MG/5ML syrup Take 5 mLs by mouth 4 (four) times daily as needed for cough.   No facility-administered encounter medications on file as of 07/27/2017.     No Known Allergies  Review of Systems  Constitutional: Positive for fatigue. Negative for activity change, appetite change, chills, fever and unexpected weight change.       Sleep disturbance  HENT: Negative for congestion, dental problem, postnasal drip and rhinorrhea.   Eyes: Negative for redness and visual disturbance.  Respiratory: Positive for cough, shortness of breath and wheezing.   Cardiovascular: Negative for chest pain, palpitations and leg swelling.  Gastrointestinal: Negative for diarrhea, nausea and vomiting.  Genitourinary: Negative for difficulty urinating and frequency.  Musculoskeletal: Negative for back pain and myalgias.  Skin: Negative for rash.  Neurological: Negative for dizziness and headaches.  Psychiatric/Behavioral: Positive for sleep disturbance. Negative for dysphoric mood. The patient is not nervous/anxious.     BP (!) 142/68 (BP Location: Left Arm, Patient Position: Sitting, Cuff Size: Normal)   Pulse (!) 56   Temp 99 F (37.2 C) (Oral)   Resp 20   Ht 5\' 3"  (1.6 m)   Wt 177 lb 12 oz (  80.6 kg)   SpO2 96%   BMI 31.49 kg/m   Physical Exam  Constitutional: She is oriented to person, place, and time. She appears well-developed and well-nourished. She appears distressed.  Mild dyspnea, you can hear the patient wheezing from 6 feet away, frequent cough  HENT:  Head: Normocephalic and atraumatic.  Right Ear: External ear normal.  Left Ear: External ear normal.  Nose: Nose normal.  Mouth/Throat: Oropharynx is clear and moist.  Eyes:  Conjunctivae are normal. Pupils are equal, round, and reactive to light.  Neck: Normal range of motion.  Cardiovascular: Normal rate, regular rhythm and normal heart sounds.  Pulmonary/Chest: No respiratory distress. She has wheezes. She has no rales. She exhibits no tenderness.  Initial, wheezing and rhonchi throughout chest.  After nebulizing treatment, scattered wheeze no rhonchi or rales  Abdominal: Soft. Bowel sounds are normal. There is no tenderness.  Lymphadenopathy:    She has no cervical adenopathy.  Neurological: She is alert and oriented to person, place, and time.  Psychiatric: She has a normal mood and affect. Her behavior is normal. Thought content normal.    ASSESSMENT/PLAN:  1. Wheezing With URI.  Suspect mild intermittent asthma  2. Acute bronchitis, unspecified organism With 4 weeks of symptoms and persistent symptoms, I believe she has a bacterial infection.  Treated with antibiotics and prednisone.  I am not using a Z-Pak because she already took one for this infection and it did not work.  Call if not better in a few days.  Follow-up with Dr. Moshe Cipro as scheduled   Patient Instructions  Take the antibiotic 2 X a day Take the prednisone 2 X a day Call if this does not help Cough medicine ordered Diflucan sent in case needed     Raylene Everts, MD

## 2017-08-29 ENCOUNTER — Ambulatory Visit: Payer: Self-pay | Admitting: Family Medicine

## 2017-09-25 ENCOUNTER — Other Ambulatory Visit: Payer: Self-pay | Admitting: Family Medicine

## 2017-11-17 ENCOUNTER — Other Ambulatory Visit: Payer: Self-pay | Admitting: Family Medicine

## 2018-02-15 ENCOUNTER — Telehealth: Payer: Self-pay | Admitting: Family Medicine

## 2018-02-15 ENCOUNTER — Other Ambulatory Visit: Payer: Self-pay

## 2018-02-15 MED ORDER — SITAGLIP PHOS-METFORMIN HCL ER 50-1000 MG PO TB24: 2.0000 | ORAL_TABLET | Freq: Every day | ORAL | 1 refills | Status: DC

## 2018-02-15 NOTE — Telephone Encounter (Signed)
Patient left voicemail requesting a call back to phone # [phone]/ [phone]. No answer.

## 2018-02-15 NOTE — Telephone Encounter (Signed)
Work in this pm before 3

## 2018-02-15 NOTE — Telephone Encounter (Signed)
Pt is calling asking for Z pack, and cough medicine.. Started 02-14-18, coughing, no fever, shortness of breath, like its taking her air.Marland Kitchen

## 2018-02-15 NOTE — Telephone Encounter (Signed)
LVM for the PT to call us back, --advised of workin before 3--but she needed to call

## 2018-04-06 ENCOUNTER — Other Ambulatory Visit: Payer: Self-pay | Admitting: Family Medicine

## 2018-06-07 ENCOUNTER — Ambulatory Visit (INDEPENDENT_AMBULATORY_CARE_PROVIDER_SITE_OTHER): Payer: Self-pay | Admitting: Family Medicine

## 2018-06-07 ENCOUNTER — Encounter: Payer: Self-pay | Admitting: Family Medicine

## 2018-06-07 VITALS — BP 120/82 | HR 75 | Resp 12 | Ht 63.0 in | Wt 175.0 lb

## 2018-06-07 DIAGNOSIS — R062 Wheezing: Secondary | ICD-10-CM

## 2018-06-07 DIAGNOSIS — Z1329 Encounter for screening for other suspected endocrine disorder: Secondary | ICD-10-CM

## 2018-06-07 DIAGNOSIS — E785 Hyperlipidemia, unspecified: Secondary | ICD-10-CM

## 2018-06-07 DIAGNOSIS — E669 Obesity, unspecified: Secondary | ICD-10-CM

## 2018-06-07 DIAGNOSIS — E1159 Type 2 diabetes mellitus with other circulatory complications: Secondary | ICD-10-CM

## 2018-06-07 DIAGNOSIS — H409 Unspecified glaucoma: Secondary | ICD-10-CM

## 2018-06-07 DIAGNOSIS — I10 Essential (primary) hypertension: Secondary | ICD-10-CM

## 2018-06-07 NOTE — Progress Notes (Signed)
Daisy Robbins     MRN: [id number]      DOB: 27-Feb-1961   HPI Daisy Robbins is here for follow up and re-evaluation of chronic medical conditions, medication management and review of any available recent lab and radiology data.  Preventive health is updated, specifically  Cancer screening and Immunization.   Questions or concerns regarding consultations or procedures which the PT has had in the interim are  Addressed.Being treated by [employer] for injury to right shoulder and right knee on the job, case is settled, states she has no interest in replacement of the shoulder now as she is still relatively young and able to function despite pain and limitation Denies polyuria, polydipsia, blurred vision , or hypoglycemic episodes.  The PT denies any adverse reactions to current medications since the last visit.  There are no new concerns.  There are no specific complaints   ROS Denies recent fever or chills. Denies sinus pressure, nasal congestion, ear pain or sore throat. Denies chest congestion, productive cough or wheezing. Denies chest pains, palpitations and leg swelling Denies abdominal pain, nausea, vomiting,diarrhea or constipation.   Denies dysuria, frequency, hesitancy or incontinence.  Denies headaches, seizures, numbness, or tingling. Denies depression, anxiety or insomnia. Denies skin break down or rash.   PE  BP 120/82   Pulse 75   Resp 12   Ht 5\' 3"  (1.6 m)   Wt 175 lb 0.6 oz (79.4 kg)   SpO2 99% Comment: room air  BMI 31.01 kg/m    Patient alert and oriented and in no cardiopulmonary distress.  HEENT: No facial asymmetry, EOMI,   oropharynx pink and moist.  Neck supple no JVD, no mass.  Chest: Clear to auscultation bilaterally.  CVS: S1, S2 no murmurs, no S3.Regular rate.  ABD: Soft non tender.   Ext: No edema  MS: Adequate ROM spine, shoulders, hips and knees.  Skin: Intact, no ulcerations or rash noted.  Psych: Good eye contact, normal  affect. Memory intact not anxious or depressed appearing.  CNS: CN 2-12 intact, power,  normal throughout.no focal deficits noted.   Assessment & Plan  Type 2 diabetes mellitus with vascular disease ([place]) Updated lab needed and is pas due. Daisy Robbins is reminded of the importance of commitment to daily physical activity for 30 minutes or more, as able and the need to limit carbohydrate intake to 30 to 60 grams per meal to help with blood sugar control.   The need to take medication as prescribed, test blood sugar as directed, and to call between visits if there is a concern that blood sugar is uncontrolled is also discussed.   Daisy Robbins is reminded of the importance of daily foot exam, annual eye examination, and good blood sugar, blood pressure and cholesterol control.  Diabetic Labs Latest Ref Rng & Units 04/18/2017 11/01/2016 02/25/2015 02/24/2015 01/22/2014  HbA1c <5.7 % of total Hgb 7.3(H) 8.0(H) - 7.7(H) 7.4(H)  Microalbumin Not estab mg/dL - 0.4 5.0(H) - 0.50  Micro/Creat Ratio <30 mcg/mg creat - 3 28.6 - 8.3  Chol <200 mg/dL - 196 - 167 201(H)  HDL >50 mg/dL - 86 - 82 95  Calc LDL <100 mg/dL - 94 - 68 91  Triglycerides <150 mg/dL - 78 - 84 74  Creatinine 0.50 - 1.05 mg/dL 0.73 0.78 - 0.65 0.65   BP/Weight 06/07/2018 07/27/2017 05/01/2017 04/18/2017 11/01/2016 04/27/2016 12/03/5282  Systolic BP 132 440 102 725 366 440 347  Diastolic BP 82 68 76 80  74 82 80  Wt. (Lbs) 175.04 177.75 182 182 177.12 172.04 168  BMI 31.01 31.49 32.24 32.24 31.38 30.48 29.76   Foot/eye exam completion dates Latest Ref Rng & Units 06/07/2018 11/01/2016  Eye Exam No Retinopathy - -  Foot Form Completion - Done Done        ESSENTIAL HYPERTENSION, BENIGN Controlled, no change in medication DASH diet and commitment to daily physical activity for a minimum of 30 minutes discussed and encouraged, as a part of hypertension management. The importance of attaining a healthy weight is also discussed.  BP/Weight  06/07/2018 07/27/2017 05/01/2017 04/18/2017 11/01/2016 04/27/2016 1/61/0960  Systolic BP 454 098 119 147 829 562 130  Diastolic BP 82 68 76 80 74 82 80  Wt. (Lbs) 175.04 177.75 182 182 177.12 172.04 168  BMI 31.01 31.49 32.24 32.24 31.38 30.48 29.76       Obesity (BMI 30.0-34.9) Unchanged. Patient re-educated about  the importance of commitment to a  minimum of 150 minutes of exercise per week.  The importance of healthy food choices with portion control discussed. Encouraged to start a food diary, count calories and to consider  joining a support group. Sample diet sheets offered. Goals set by the patient for the next several months.   Weight /BMI 06/07/2018 07/27/2017 05/01/2017  WEIGHT 175 lb 0.6 oz 177 lb 12 oz 182 lb  HEIGHT 5\' 3"  5\' 3"  5\' 3"   BMI 31.01 kg/m2 31.49 kg/m2 32.24 kg/m2      Unspecified glaucoma Eye exam past due, pt advised to make and keep appt  Wheezing Marked decrease noted since no longer in work environ, which was cold

## 2018-06-07 NOTE — Patient Instructions (Addendum)
F/U in 6 months, call if you need me before  Fasting lipid , cmopa dnEGFr , Microalb, CBC and tSH this week at [facility] lab  BP is good  Start aspirin  81 mg daily to protect heart  pls call Dr Gershon Crane in [place] for eye appt andkeep this it is very important  Foot exam is good  Thank you  for choosing [facility]. We consider it a privelige to serve you.  Delivering excellent health care in a caring and  compassionate way is our goal.  Partnering with you,  so that together we can achieve this goal is our strategy.

## 2018-06-08 ENCOUNTER — Encounter: Payer: Self-pay | Admitting: Family Medicine

## 2018-06-08 NOTE — Assessment & Plan Note (Signed)
Marked decrease noted since no longer in work environ, which was cold

## 2018-06-08 NOTE — Assessment & Plan Note (Signed)
Eye exam past due, pt advised to make and keep appt

## 2018-06-08 NOTE — Assessment & Plan Note (Signed)
Unchanged. Patient re-educated about  the importance of commitment to a  minimum of 150 minutes of exercise per week.  The importance of healthy food choices with portion control discussed. Encouraged to start a food diary, count calories and to consider  joining a support group. Sample diet sheets offered. Goals set by the patient for the next several months.   Weight /BMI 06/07/2018 07/27/2017 05/01/2017  WEIGHT 175 lb 0.6 oz 177 lb 12 oz 182 lb  HEIGHT 5\' 3"  5\' 3"  5\' 3"   BMI 31.01 kg/m2 31.49 kg/m2 32.24 kg/m2

## 2018-06-08 NOTE — Assessment & Plan Note (Signed)
Controlled, no change in medication DASH diet and commitment to daily physical activity for a minimum of 30 minutes discussed and encouraged, as a part of hypertension management. The importance of attaining a healthy weight is also discussed.  BP/Weight 06/07/2018 07/27/2017 05/01/2017 04/18/2017 11/01/2016 04/27/2016 5/40/0867  Systolic BP 619 509 326 712 458 099 833  Diastolic BP 82 68 76 80 74 82 80  Wt. (Lbs) 175.04 177.75 182 182 177.12 172.04 168  BMI 31.01 31.49 32.24 32.24 31.38 30.48 29.76

## 2018-06-08 NOTE — Assessment & Plan Note (Signed)
Updated lab needed and is pas due. Daisy Robbins is reminded of the importance of commitment to daily physical activity for 30 minutes or more, as able and the need to limit carbohydrate intake to 30 to 60 grams per meal to help with blood sugar control.   The need to take medication as prescribed, test blood sugar as directed, and to call between visits if there is a concern that blood sugar is uncontrolled is also discussed.   Daisy Robbins is reminded of the importance of daily foot exam, annual eye examination, and good blood sugar, blood pressure and cholesterol control.  Diabetic Labs Latest Ref Rng & Units 04/18/2017 11/01/2016 02/25/2015 02/24/2015 01/22/2014  HbA1c <5.7 % of total Hgb 7.3(H) 8.0(H) - 7.7(H) 7.4(H)  Microalbumin Not estab mg/dL - 0.4 5.0(H) - 0.50  Micro/Creat Ratio <30 mcg/mg creat - 3 28.6 - 8.3  Chol <200 mg/dL - 196 - 167 201(H)  HDL >50 mg/dL - 86 - 82 95  Calc LDL <100 mg/dL - 94 - 68 91  Triglycerides <150 mg/dL - 78 - 84 74  Creatinine 0.50 - 1.05 mg/dL 0.73 0.78 - 0.65 0.65   BP/Weight 06/07/2018 07/27/2017 05/01/2017 04/18/2017 11/01/2016 04/27/2016 08/01/3333  Systolic BP 456 256 389 373 428 768 115  Diastolic BP 82 68 76 80 74 82 80  Wt. (Lbs) 175.04 177.75 182 182 177.12 172.04 168  BMI 31.01 31.49 32.24 32.24 31.38 30.48 29.76   Foot/eye exam completion dates Latest Ref Rng & Units 06/07/2018 11/01/2016  Eye Exam No Retinopathy - -  Foot Form Completion - Done Done

## 2018-06-21 ENCOUNTER — Encounter: Payer: Self-pay | Admitting: *Deleted

## 2018-06-21 LAB — CBC
HCT: 38 % (ref 35.0–45.0)
Hemoglobin: 12.3 g/dL (ref 11.7–15.5)
MCH: 22.6 pg — ABNORMAL LOW (ref 27.0–33.0)
MCHC: 32.4 g/dL (ref 32.0–36.0)
MCV: 69.7 fL — ABNORMAL LOW (ref 80.0–100.0)
MPV: 11.9 fL (ref 7.5–12.5)
Platelets: 340 10*3/uL (ref 140–400)
RBC: 5.45 10*6/uL — ABNORMAL HIGH (ref 3.80–5.10)
RDW: 15.7 % — ABNORMAL HIGH (ref 11.0–15.0)
WBC: 8.3 10*3/uL (ref 3.8–10.8)

## 2018-06-21 LAB — COMPLETE METABOLIC PANEL WITHOUT GFR
AG Ratio: 1.3 (calc) (ref 1.0–2.5)
ALT: 14 U/L (ref 6–29)
AST: 14 U/L (ref 10–35)
Albumin: 4.3 g/dL (ref 3.6–5.1)
Alkaline phosphatase (APISO): 93 U/L (ref 33–130)
BUN: 11 mg/dL (ref 7–25)
CO2: 32 mmol/L (ref 20–32)
Calcium: 10.1 mg/dL (ref 8.6–10.4)
Chloride: 101 mmol/L (ref 98–110)
Creat: 0.92 mg/dL (ref 0.50–1.05)
GFR, Est African American: 80 mL/min/1.73m2 (ref >=60)
GFR, Est Non African American: 69 mL/min/1.73m2 (ref >=60)
Globulin: 3.3 g/dL (ref 1.9–3.7)
Glucose, Bld: 127 mg/dL — ABNORMAL HIGH (ref 65–99)
Potassium: 3.7 mmol/L (ref 3.5–5.3)
Sodium: 142 mmol/L (ref 135–146)
Total Bilirubin: 0.3 mg/dL (ref 0.2–1.2)
Total Protein: 7.6 g/dL (ref 6.1–8.1)

## 2018-06-21 LAB — LIPID PANEL
Cholesterol: 183 mg/dL (ref <200)
HDL: 72 mg/dL (ref >50)
LDL Cholesterol (Calc): 94 mg/dL
Non-HDL Cholesterol (Calc): 111 mg/dL (ref <130)
Total CHOL/HDL Ratio: 2.5 (calc) (ref <5.0)
Triglycerides: 81 mg/dL (ref <150)

## 2018-06-21 LAB — TSH: TSH: 1.25 mIU/L (ref 0.40–4.50)

## 2018-06-21 LAB — MICROALBUMIN / CREATININE URINE RATIO
Creatinine, Urine: 556 mg/dL — ABNORMAL HIGH (ref 20–275)
Microalb Creat Ratio: 4 mcg/mg creat (ref <30)
Microalb, Ur: 2.5 mg/dL

## 2018-08-01 ENCOUNTER — Telehealth: Payer: Self-pay | Admitting: Family Medicine

## 2018-08-01 NOTE — Telephone Encounter (Signed)
Pt is calling in states her sinus are out of wack, she needs something called in

## 2018-08-02 ENCOUNTER — Other Ambulatory Visit: Payer: Self-pay | Admitting: Family Medicine

## 2018-08-02 MED ORDER — MONTELUKAST SODIUM 10 MG PO TABS: 10.0000 mg | ORAL_TABLET | Freq: Every day | ORAL | 3 refills | Status: DC

## 2018-08-02 MED ORDER — CHLORPHENIRAMINE MALEATE 4 MG PO TABS: 4.0000 mg | ORAL_TABLET | Freq: Two times a day (BID) | ORAL | 0 refills | Status: DC | PRN

## 2018-08-02 NOTE — Telephone Encounter (Signed)
Patient made aware of treatment and is in agreement

## 2018-08-02 NOTE — Telephone Encounter (Signed)
Medication for allergy, singulair and chlorpheniramine are both prescribed, pls let her know

## 2018-08-02 NOTE — Telephone Encounter (Signed)
Spoke with patient and she complains of Running nose, sneezy, watery eyes. States she gets this every year when the seasons are changing. Can something be called in for her?

## 2018-08-07 ENCOUNTER — Other Ambulatory Visit: Payer: Self-pay | Admitting: Family Medicine

## 2018-08-20 DIAGNOSIS — M12811 Other specific arthropathies, not elsewhere classified, right shoulder: Secondary | ICD-10-CM | POA: Insufficient documentation

## 2018-08-27 ENCOUNTER — Telehealth: Payer: Self-pay | Admitting: *Deleted

## 2018-08-27 ENCOUNTER — Other Ambulatory Visit: Payer: Self-pay

## 2018-08-27 ENCOUNTER — Other Ambulatory Visit: Payer: Self-pay | Admitting: Family Medicine

## 2018-08-27 NOTE — Telephone Encounter (Signed)
Called patient to let her know that I need to know what strength her medication is. She will call back tomorrow with that information.

## 2018-08-27 NOTE — Telephone Encounter (Signed)
Pt called in stating she needed a refill on her jardiance this can be sent to the [facility] in [place]

## 2018-08-28 ENCOUNTER — Other Ambulatory Visit: Payer: Self-pay

## 2018-08-28 NOTE — Telephone Encounter (Signed)
Pt called back with dosage 50 mg / 1000mg  taken once in the morning and once in the evening

## 2018-08-28 NOTE — Telephone Encounter (Signed)
Called the patient's pharmacy as Jardiance doesn't come in 50/1000mg  dosing. She has been taking Janumet XR 50/1000 mg not Jardiance. Authorized 90 day refill.

## 2018-08-29 ENCOUNTER — Other Ambulatory Visit: Payer: Self-pay | Admitting: Family Medicine

## 2018-08-29 ENCOUNTER — Telehealth: Payer: Self-pay

## 2018-08-29 DIAGNOSIS — E1159 Type 2 diabetes mellitus with other circulatory complications: Secondary | ICD-10-CM

## 2018-08-29 MED ORDER — METFORMIN HCL 1000 MG PO TABS: 1000.0000 mg | ORAL_TABLET | Freq: Two times a day (BID) | ORAL | 1 refills | Status: DC

## 2018-08-29 NOTE — Telephone Encounter (Signed)
Patient can no longer afford the combo med. Pharmacy said it would be $30 for a 30 day supply but the patient states that even that is just too high for her right now. She wants to just go back to Metformin if that is the cheapest option.

## 2018-08-29 NOTE — Progress Notes (Signed)
Patient called and stated that she needed to have a medication change.  Could no longer afford the combo pill that she was on for her diabetes.  Will adjust this and call her in the metformin that she was previously taking.  She needs an updated A1c, I ordered this as well.  Advised her to get the updated A1c this week, as it is hard to tell what dose of metformin she should be on so that we can appropriately treat her without this updated.  Will provide her with 1 refill of the Metformin to ensure that we can get a proper assessment prior to continued use of it.   Perlie Mayo, DNP, AGNP-BC [place], [address], [place] [address] Office Hours: Mon-Thurs 8 am-5 pm; Fri 8 am-12 pm Office Phone:  [phone]  Office Fax: [phone]

## 2018-08-29 NOTE — Telephone Encounter (Signed)
Made patient aware that Metformin has been called in and that she needs to have A1C drawn with verbal understanding.

## 2018-08-29 NOTE — Telephone Encounter (Signed)
A1C ordered. Patient aware

## 2018-08-29 NOTE — Telephone Encounter (Signed)
Pt called stated she went to the pharmacy to pick up medication that was sent in and it was 75$ she cannot afford this as she was working before and has since come out of work. She needs something different called in because she cant afford to pick this one up.

## 2018-10-02 ENCOUNTER — Other Ambulatory Visit: Payer: Self-pay

## 2018-10-02 DIAGNOSIS — E1159 Type 2 diabetes mellitus with other circulatory complications: Secondary | ICD-10-CM

## 2018-10-02 MED ORDER — METFORMIN HCL 1000 MG PO TABS: 1000.0000 mg | ORAL_TABLET | Freq: Two times a day (BID) | ORAL | 5 refills | Status: DC

## 2018-10-12 ENCOUNTER — Other Ambulatory Visit: Payer: Self-pay | Admitting: Family Medicine

## 2018-11-03 ENCOUNTER — Other Ambulatory Visit: Payer: Self-pay | Admitting: Family Medicine

## 2018-11-20 ENCOUNTER — Other Ambulatory Visit: Payer: Self-pay

## 2018-11-20 ENCOUNTER — Ambulatory Visit: Payer: Self-pay | Admitting: Family Medicine

## 2018-11-20 ENCOUNTER — Telehealth: Payer: Self-pay

## 2018-11-20 DIAGNOSIS — E1159 Type 2 diabetes mellitus with other circulatory complications: Secondary | ICD-10-CM

## 2018-11-20 MED ORDER — BENAZEPRIL-HYDROCHLOROTHIAZIDE 20-12.5 MG PO TABS: ORAL_TABLET | ORAL | 3 refills | Status: DC

## 2018-11-20 NOTE — Telephone Encounter (Signed)
noted 

## 2018-11-20 NOTE — Telephone Encounter (Signed)
Was taking metformin but wants to go back on the med you had originally prescribed (janumet) I will get her a coupon printed and she will collect. Please advise

## 2018-11-20 NOTE — Telephone Encounter (Signed)
fyi

## 2018-11-20 NOTE — Telephone Encounter (Signed)
Labs have been ordered. Pt needs to have them done and an in office appt scheduled to review labs. (pt does not want tele visit)

## 2018-11-20 NOTE — Telephone Encounter (Signed)
Called pt to set up appt she said she could not do labs this week she already had plans. When offered to set up the appt she said she would call back as she had to check into some things.

## 2018-11-20 NOTE — Telephone Encounter (Signed)
Please , she needs non fast chem y7 and EGFR and hBA1C POls get today or tomorrow.  pls  Arrange tele visit with me this Thursday to f/u, thanks

## 2018-11-23 NOTE — Progress Notes (Signed)
Pt did not keep appt

## 2018-12-06 ENCOUNTER — Ambulatory Visit: Payer: Self-pay | Admitting: Family Medicine

## 2018-12-10 ENCOUNTER — Other Ambulatory Visit: Payer: Self-pay | Admitting: Family Medicine

## 2018-12-20 LAB — BASIC METABOLIC PANEL WITH GFR
BUN: 13 mg/dL (ref 7–25)
CO2: 31 mmol/L (ref 20–32)
Calcium: 10.3 mg/dL (ref 8.6–10.4)
Chloride: 100 mmol/L (ref 98–110)
Creat: 0.87 mg/dL (ref 0.50–1.05)
GFR, Est African American: 85 mL/min/{1.73_m2} (ref >=60)
GFR, Est Non African American: 73 mL/min/{1.73_m2} (ref >=60)
Glucose, Bld: 113 mg/dL (ref 65–139)
Potassium: 4.1 mmol/L (ref 3.5–5.3)
Sodium: 141 mmol/L (ref 135–146)

## 2018-12-20 LAB — HEMOGLOBIN A1C
Hgb A1c MFr Bld: 6.6 % of total Hgb — ABNORMAL HIGH (ref <5.7)
Mean Plasma Glucose: 143 (calc)
eAG (mmol/L): 7.9 (calc)

## 2019-01-12 ENCOUNTER — Other Ambulatory Visit: Payer: Self-pay | Admitting: Family Medicine

## 2019-02-18 ENCOUNTER — Other Ambulatory Visit: Payer: Self-pay | Admitting: Family Medicine

## 2019-03-20 ENCOUNTER — Ambulatory Visit: Payer: BC Managed Care – PPO | Admitting: Family Medicine

## 2019-03-20 ENCOUNTER — Other Ambulatory Visit: Payer: Self-pay

## 2019-03-20 ENCOUNTER — Encounter: Payer: Self-pay | Admitting: Family Medicine

## 2019-03-20 VITALS — BP 124/76 | HR 88 | Temp 98.8°F | Resp 15 | Ht 64.0 in | Wt 167.1 lb

## 2019-03-20 DIAGNOSIS — E1159 Type 2 diabetes mellitus with other circulatory complications: Secondary | ICD-10-CM

## 2019-03-20 DIAGNOSIS — E663 Overweight: Secondary | ICD-10-CM

## 2019-03-20 DIAGNOSIS — E785 Hyperlipidemia, unspecified: Secondary | ICD-10-CM

## 2019-03-20 DIAGNOSIS — Z23 Encounter for immunization: Secondary | ICD-10-CM

## 2019-03-20 DIAGNOSIS — E559 Vitamin D deficiency, unspecified: Secondary | ICD-10-CM

## 2019-03-20 DIAGNOSIS — I10 Essential (primary) hypertension: Secondary | ICD-10-CM

## 2019-03-20 MED ORDER — JANUMET XR 50-1000 MG PO TB24: 2.0000 | ORAL_TABLET | Freq: Every day | ORAL | 0 refills | Status: DC

## 2019-03-20 NOTE — Patient Instructions (Addendum)
  I appreciate the opportunity to provide you with the care for your health and wellness. Today we discussed: overall health  Follow up:  March for annual with vision, no pap   Labs placed today, please get these 1 week before next appt. Remember to fast. :)  It was nice to meet you today. Please continue to take good care of yourself and enjoy the up coming Holidays. We will see you back in the Spring.  Call us if you need anything before that.   Please continue to practice social distancing to keep you, your family, and our community safe.  If you must go out, please wear a mask and practice good handwashing.  It was a pleasure to see you and I look forward to continuing to work together on your health and well-being. Please do not hesitate to call the office if you need care or have questions about your care.  Have a wonderful day and week. With Gratitude, Cherly Beach, DNP, AGNP-BC

## 2019-03-20 NOTE — Progress Notes (Signed)
Subjective:     Patient ID: Daisy Robbins, female   DOB: [birth year], 59 y.o.   MRN: [id number]  Daisy Robbins presents for Diabetes (follow up) and Hypertension  Here for follow-up of hypertension. Reports stay active, but not doing any formal exercising. Working on not snacking as much , like she did when she was when she was working a lot.  Denies headache, dizziness, vision changes, chest pain, or leg swelling. Reports taking all medications as directed and denies side effects.  Diabetes follow-up: Denies hypoglycemia.  Denies polydipsia and polyuria.  Last eye exam was- she reports needing to get this scheduled. Last foot exam in office was 06/2018.  Last A1c 12/2018-6.6%. Denies non-healing wounds or rashes. Denies signs of UTI or other infections. Patient follows a low sugar diet and checks feet regularly without concerns. Reports staying hydrated by drinking water.   Today patient denies signs and symptoms of COVID 19 infection including fever, chills, cough, shortness of breath, and headache.  Past Medical, Surgical, Social History, Allergies, and Medications have been Reviewed.  Past Medical History:  Diagnosis Date  . Allergy   . Diabetes mellitus   . Hypertension    Past Surgical History:  Procedure Laterality Date  . ABDOMINAL HYSTERECTOMY    . CESAREAN SECTION    . COLONOSCOPY  03/01/2012   Procedure: COLONOSCOPY;  Surgeon: Rogene Houston, MD;  Location: [facility];  Service: Endoscopy;  Laterality: N/A;  950  . right carpal tunnel release     Social History   Socioeconomic History  . Marital status: Married    Spouse name: Not on file  . Number of children: Not on file  . Years of education: Not on file  . Highest education level: Not on file  Occupational History  . Not on file  Social Needs  . Financial resource strain: Not on file  . Food insecurity    Worry: Not on file    Inability: Not on file  . Transportation needs    Medical: Not on file   Non-medical: Not on file  Tobacco Use  . Smoking status: Never Smoker  . Smokeless tobacco: Never Used  Substance and Sexual Activity  . Alcohol use: No  . Drug use: No  . Sexual activity: Not on file  Lifestyle  . Physical activity    Days per week: Not on file    Minutes per session: Not on file  . Stress: Not on file  Relationships  . Social Herbalist on phone: Not on file    Gets together: Not on file    Attends religious service: Not on file    Active member of club or organization: Not on file    Attends meetings of clubs or organizations: Not on file    Relationship status: Not on file  . Intimate partner violence    Fear of current or ex partner: Not on file    Emotionally abused: Not on file    Physically abused: Not on file    Forced sexual activity: Not on file  Other Topics Concern  . Not on file  Social History Narrative  . Not on file    Outpatient Encounter Medications as of 03/20/2019  Medication Sig  . albuterol (PROVENTIL HFA;VENTOLIN HFA) 108 (90 Base) MCG/ACT inhaler Inhale 2 puffs into the lungs every 6 (six) hours as needed for wheezing or shortness of breath.  . benazepril-hydrochlorthiazide (LOTENSIN HCT) 20-12.5 MG  tablet One and one half tabs everyday  . chlorpheniramine (CHLOR-TRIMETON) 4 MG tablet Take 1 tablet (4 mg total) by mouth 2 (two) times daily as needed for allergies.  . montelukast (SINGULAIR) 10 MG tablet TAKE 1 TABLET BY MOUTH EVERYDAY AT BEDTIME  . SitaGLIPtin-MetFORMIN HCl (JANUMET XR) 50-1000 MG TB24 Take 2 tablets by mouth daily.  . [DISCONTINUED] JANUMET XR 50-1000 MG TB24 TAKE 2 TABLETS BY MOUTH EVERY DAY  . [DISCONTINUED] metFORMIN (GLUCOPHAGE) 1000 MG tablet Take 1 tablet (1,000 mg total) by mouth 2 (two) times daily with a meal.   Facility-Administered Encounter Medications as of 03/20/2019  Medication  . ipratropium-albuterol (DUONEB) 0.5-2.5 (3) MG/3ML nebulizer solution 3 mL   No Known Allergies  Review of  Systems  Constitutional: Negative for chills and fever.  HENT: Negative.   Eyes: Negative.  Negative for visual disturbance.  Respiratory: Negative.  Negative for cough and shortness of breath.   Cardiovascular: Negative.  Negative for chest pain, palpitations and leg swelling.  Gastrointestinal: Negative.   Endocrine: Negative.  Negative for polydipsia, polyphagia and polyuria.  Genitourinary: Negative.   Musculoskeletal: Negative.        Still has shoulder pain with rotator cuff-right  Skin: Negative.   Allergic/Immunologic: Negative.   Neurological: Negative.  Negative for dizziness and headaches.  Hematological: Negative.   Psychiatric/Behavioral: Positive for sleep disturbance.  All other systems reviewed and are negative.      Objective:     BP 124/76   Pulse 88   Temp 98.8 F (37.1 C) (Oral)   Resp 15   Ht 5\' 4"  (1.626 m)   Wt 167 lb 1.3 oz (75.8 kg)   SpO2 98%   BMI 28.68 kg/m   Physical Exam Vitals signs and nursing note reviewed.  Constitutional:      Appearance: Normal appearance. She is well-developed, well-groomed and overweight.  HENT:     Head: Normocephalic and atraumatic.     Right Ear: External ear normal.     Left Ear: External ear normal.     Nose: Nose normal.     Mouth/Throat:     Mouth: Mucous membranes are moist.     Pharynx: Oropharynx is clear.  Eyes:     General:        Right eye: No discharge.        Left eye: No discharge.     Conjunctiva/sclera: Conjunctivae normal.  Neck:     Musculoskeletal: Normal range of motion and neck supple.  Cardiovascular:     Rate and Rhythm: Normal rate and regular rhythm.     Pulses: Normal pulses.     Heart sounds: Normal heart sounds.  Pulmonary:     Effort: Pulmonary effort is normal.     Breath sounds: Normal breath sounds.  Musculoskeletal: Normal range of motion.  Skin:    General: Skin is warm.  Neurological:     General: No focal deficit present.     Mental Status: She is alert and  oriented to person, place, and time.  Psychiatric:        Attention and Perception: Attention normal.        Mood and Affect: Mood normal.        Speech: Speech normal.        Behavior: Behavior normal. Behavior is cooperative.        Thought Content: Thought content normal.        Cognition and Memory: Cognition normal.  Judgment: Judgment normal.        Assessment and Plan        1. Type 2 diabetes mellitus with vascular disease ([place]) Daisy Robbins is encouraged to check blood sugar daily as directed. Continue current medications. Is not on a statin  Educated on importance of maintain a well balanced diabetic friendly diet.   She is reminded the importance of maintaining  good blood sugars,  taking medications as directed, daily foot care, annual eye exams. Additionally educated about keeping good control over blood pressure and cholesterol as well.  We will get updated labs prior to annual visit in March 2021  - CBC - COMPLETE METABOLIC PANEL WITH GFR - Hemoglobin A1c - Lipid panel  2. Need for immunization against influenza Patient was educated on the recommendation for flu vaccine. After obtaining informed consent, the vaccine was administered no adverse effects noted at time of administration. Patient provided with education on arm soreness and use of tylenol or ibuprofen (if safe) for this. Encourage to use the arm vaccine was given in to help reduce the soreness. Patient educated on the signs of a reaction to the vaccine and advised to contact the office should these occur.   - Flu Vaccine QUAD 36+ mos IM  3. Essential hypertension, benign Daisy Robbins is encouraged to maintain a well balanced diet that is low in salt. Controlled, continue current medication regimen. No refills needed.  Additionally, she is also reminded that exercise is beneficial for heart health and control of  Blood pressure. 30-60 minutes daily is recommended-walking was suggested.   - CBC - COMPLETE METABOLIC PANEL WITH GFR  4. Hyperlipidemia LDL goal <100 Previous cholesterol levels good we will get update prior to next appointment - Lipid panel  5. Overweight (BMI 25.0-29.9) Needs update  - TSH  6. Vitamin D deficiency Needs update labs  - VITAMIN D 25 Hydroxy (Vit-D Deficiency, Fractures)    Follow Up: March for annual no pap ? [per her]  Perlie Mayo, DNP, AGNP-BC [place], [place], [place] [address] Office Hours: Mon-Thurs 8 am-5 pm; Fri 8 am-12 pm Office Phone:  [phone]  Office Fax: [phone]

## 2019-06-29 ENCOUNTER — Other Ambulatory Visit: Payer: Self-pay | Admitting: Family Medicine

## 2019-08-06 ENCOUNTER — Ambulatory Visit: Payer: BC Managed Care – PPO | Attending: Internal Medicine

## 2019-08-06 DIAGNOSIS — Z23 Encounter for immunization: Secondary | ICD-10-CM | POA: Insufficient documentation

## 2019-08-06 NOTE — Progress Notes (Signed)
   Covid-19 Vaccination Clinic  Name:  DENIZ CERULLO    MRN: [id number] DOB: Mar 17, 1961  08/06/2019  Ms. Donaghey was observed post Covid-19 immunization for 15 minutes without incident. She was provided with Vaccine Information Sheet and instruction to access the V-Safe system.   Ms. Zeleny was instructed to call 911 with any severe reactions post vaccine: Marland Kitchen Difficulty breathing  . Swelling of face and throat  . A fast heartbeat  . A bad rash all over body  . Dizziness and weakness   Immunizations Administered    Name Date Dose VIS Date Route   Moderna COVID-19 Vaccine 08/06/2019 11:38 AM 0.5 mL 05/07/2019 Intramuscular   Manufacturer: Moderna   Lot: [id number]   [id number]

## 2019-08-22 ENCOUNTER — Encounter: Payer: BC Managed Care – PPO | Admitting: Family Medicine

## 2019-09-03 ENCOUNTER — Ambulatory Visit: Payer: BC Managed Care – PPO | Attending: Internal Medicine

## 2019-09-03 DIAGNOSIS — Z23 Encounter for immunization: Secondary | ICD-10-CM

## 2019-09-03 NOTE — Progress Notes (Signed)
   Covid-19 Vaccination Clinic  Name:  Daisy Robbins    MRN: [id number] DOB: 27-Mar-1961  09/03/2019  [name] was observed post Covid-19 immunization for 15 minutes without incident. She was provided with Vaccine Information Sheet and instruction to access the V-Safe system.   [name] was instructed to call 911 with any severe reactions post vaccine: Marland Kitchen Difficulty breathing  . Swelling of face and throat  . A fast heartbeat  . A bad rash all over body  . Dizziness and weakness   Immunizations Administered    Name Date Dose VIS Date Route   Moderna COVID-19 Vaccine 09/03/2019 10:51 AM 0.5 mL 05/07/2019 Intramuscular   Manufacturer: Moderna   Lot: [id number]   [id number]

## 2019-10-04 ENCOUNTER — Other Ambulatory Visit: Payer: Self-pay | Admitting: Family Medicine

## 2019-10-07 ENCOUNTER — Other Ambulatory Visit: Payer: Self-pay | Admitting: Family Medicine

## 2019-12-02 ENCOUNTER — Other Ambulatory Visit: Payer: Self-pay | Admitting: *Deleted

## 2019-12-02 ENCOUNTER — Encounter: Payer: Self-pay | Admitting: Family Medicine

## 2019-12-02 ENCOUNTER — Ambulatory Visit (INDEPENDENT_AMBULATORY_CARE_PROVIDER_SITE_OTHER): Payer: BC Managed Care – PPO | Admitting: Family Medicine

## 2019-12-02 ENCOUNTER — Other Ambulatory Visit: Payer: Self-pay

## 2019-12-02 VITALS — BP 122/76 | HR 71 | Temp 97.1°F | Resp 16 | Ht 62.5 in | Wt 174.1 lb

## 2019-12-02 DIAGNOSIS — H409 Unspecified glaucoma: Secondary | ICD-10-CM | POA: Diagnosis not present

## 2019-12-02 DIAGNOSIS — I10 Essential (primary) hypertension: Secondary | ICD-10-CM | POA: Diagnosis not present

## 2019-12-02 DIAGNOSIS — E559 Vitamin D deficiency, unspecified: Secondary | ICD-10-CM

## 2019-12-02 DIAGNOSIS — Z0000 Encounter for general adult medical examination without abnormal findings: Secondary | ICD-10-CM | POA: Diagnosis not present

## 2019-12-02 DIAGNOSIS — E785 Hyperlipidemia, unspecified: Secondary | ICD-10-CM

## 2019-12-02 DIAGNOSIS — E1159 Type 2 diabetes mellitus with other circulatory complications: Secondary | ICD-10-CM

## 2019-12-02 DIAGNOSIS — Z1329 Encounter for screening for other suspected endocrine disorder: Secondary | ICD-10-CM

## 2019-12-02 DIAGNOSIS — E669 Obesity, unspecified: Secondary | ICD-10-CM

## 2019-12-02 LAB — POCT GLYCOSYLATED HEMOGLOBIN (HGB A1C)
HbA1c POC (<> result, manual entry): 6.7 % (ref 4.0–5.6)
HbA1c, POC (controlled diabetic range): 6.7 % (ref 0.0–7.0)
HbA1c, POC (prediabetic range): 6.7 % — AB (ref 5.7–6.4)
Hemoglobin A1C: 6.7 % — AB (ref 4.0–5.6)

## 2019-12-02 MED ORDER — MONTELUKAST SODIUM 10 MG PO TABS: ORAL_TABLET | ORAL | 1 refills | Status: DC

## 2019-12-02 MED ORDER — BENAZEPRIL-HYDROCHLOROTHIAZIDE 20-12.5 MG PO TABS: ORAL_TABLET | ORAL | 3 refills | Status: DC

## 2019-12-02 MED ORDER — ROSUVASTATIN CALCIUM 5 MG PO TABS: ORAL_TABLET | ORAL | 3 refills | Status: DC

## 2019-12-02 MED ORDER — JANUMET XR 50-1000 MG PO TB24: 2.0000 | ORAL_TABLET | Freq: Every day | ORAL | 0 refills | Status: DC

## 2019-12-02 NOTE — Patient Instructions (Addendum)
Follow-up in office with MD in 4-1/2 months call if you need me sooner.  Labs today at [facility] lipid CMP and EGFR TSH CBC and vitamin D, also microalb   Since you are diabetic please start aspirin 81 mg 1 daily to reduce the risk of heart disease.  As a diabetic also for heart health start Crestor 5 mg 1 tablet on Monday and Thursday. Zoster vaccine recommended, please reconsider It is important that you exercise regularly at least 30 minutes 5 times a week. If you develop chest pain, have severe difficulty breathing, or feel very tired, stop exercising immediately and seek medical attention  Think about what you will eat, plan ahead. Choose " clean, green, fresh or frozen" over canned, processed or packaged foods which are more sugary, salty and fatty. 70 to 75% of food eaten should be vegetables and fruit. Three meals at set times with snacks allowed between meals, but they must be fruit or vegetables. Aim to eat over a 12 hour period , example 7 am to 7 pm, and STOP after  your last meal of the day. Drink water,generally about 64 ounces per day, no other drink is as healthy. Fruit juice is best enjoyed in a healthy way, by EATING the fruit. Thanks for choosing [facility], we consider it a privelige to serve you.

## 2019-12-02 NOTE — Assessment & Plan Note (Signed)
Annual exam as documented. . Immunization and cancer screening needs are specifically addressed at this visit.  

## 2019-12-02 NOTE — Progress Notes (Signed)
Daisy Robbins     MRN: [id number]      DOB: 01-18-1961  HPI: Patient is in for annual physical exam.  Immunization is reviewed , and  Needs zostrix but delays Needs eye exam  Has glaucoma  PE: BP 122/76 (BP Location: Right Arm, Patient Position: Sitting, Cuff Size: Normal)   Pulse 71   Temp (!) 97.1 F (36.2 C) (Temporal)   Resp 16   Ht 5' 2.5" (1.588 m)   Wt 174 lb 1.9 oz (79 kg)   SpO2 97%   BMI 31.34 kg/m   Pleasant  female, alert and oriented x 3, in no cardio-pulmonary distress. Afebrile. HEENT No facial trauma or asymetry. Sinuses non tender.  Extra occullar muscles intact.. External ears normal, . Neck: supple, no adenopathy,JVD or thyromegaly.No bruits.  Chest: Clear to ascultation bilaterally.No crackles or wheezes. Non tender to palpation  Breast: No asymetry,no masses or lumps. No tenderness. No nipple discharge or inversion. No axillary or supraclavicular adenopathy  Cardiovascular system; Heart sounds normal,  S1 and  S2 ,no S3.  No murmur, or thrill. Apical beat not displaced Peripheral pulses normal.  Abdomen: Soft, non tender, no organomegaly or masses. No bruits. Bowel sounds normal. No guarding, tenderness or rebound.   GU: Deferred pt request   Musculoskeletal exam: Full ROM of spine, hips , shoulders and reduced in right knee.  deformity ,swelling and  crepitus noted in right knee No muscle wasting or atrophy.   Neurologic: Cranial nerves 2 to 12 intact. Power, tone ,sensation and reflexes normal throughout. No disturbance in gait. No tremor.  Skin: Intact, no ulceration, erythema , scaling or rash noted. Pigmentation normal throughout  Psych; Normal mood and affect. Judgement and concentration normal   Assessment & Plan:  Annual physical exam Annual exam as documented.  Immunization and cancer screening needs are specifically addressed at this visit.   Type 2 diabetes mellitus with vascular disease  (HCC) Controlled, no change in medication Daisy Robbins is reminded of the importance of commitment to daily physical activity for 30 minutes or more, as able and the need to limit carbohydrate intake to 30 to 60 grams per meal to help with blood sugar control.   The need to take medication as prescribed, test blood sugar as directed, and to call between visits if there is a concern that blood sugar is uncontrolled is also discussed.   Daisy Robbins is reminded of the importance of daily foot exam, annual eye examination, and good blood sugar, blood pressure and cholesterol control.  Diabetic Labs Latest Ref Rng & Units 12/02/2019 12/02/2019 12/02/2019 12/02/2019 12/19/2018  HbA1c 4.0 - 5.6 % 6.7(A) 6.7 6.7 6.7(A) 6.6(H)  Microalbumin mg/dL - - - - -  Micro/Creat Ratio <30 mcg/mg creat - - - - -  Chol <200 mg/dL - - - - -  HDL >50 mg/dL - - - - -  Calc LDL mg/dL (calc) - - - - -  Triglycerides <150 mg/dL - - - - -  Creatinine 0.50 - 1.05 mg/dL - - - - 0.87   BP/Weight 12/02/2019 03/20/2019 06/07/2018 07/27/2017 05/01/2017 04/18/2017 9/37/1696  Systolic BP 789 381 017 510 258 527 782  Diastolic BP 76 76 82 68 76 80 74  Wt. (Lbs) 174.12 167.08 175.04 177.75 182 182 177.12  BMI 31.34 28.68 31.01 31.49 32.24 32.24 31.38   Foot/eye exam completion dates Latest Ref Rng & Units 06/07/2018 11/01/2016  Eye Exam No Retinopathy - -  Foot Form  Completion - Done Done

## 2019-12-02 NOTE — Assessment & Plan Note (Signed)
Controlled, no change in medication Daisy Robbins is reminded of the importance of commitment to daily physical activity for 30 minutes or more, as able and the need to limit carbohydrate intake to 30 to 60 grams per meal to help with blood sugar control.   The need to take medication as prescribed, test blood sugar as directed, and to call between visits if there is a concern that blood sugar is uncontrolled is also discussed.   Daisy Robbins is reminded of the importance of daily foot exam, annual eye examination, and good blood sugar, blood pressure and cholesterol control.  Diabetic Labs Latest Ref Rng & Units 12/02/2019 12/02/2019 12/02/2019 12/02/2019 12/19/2018  HbA1c 4.0 - 5.6 % 6.7(A) 6.7 6.7 6.7(A) 6.6(H)  Microalbumin mg/dL - - - - -  Micro/Creat Ratio <30 mcg/mg creat - - - - -  Chol <200 mg/dL - - - - -  HDL >50 mg/dL - - - - -  Calc LDL mg/dL (calc) - - - - -  Triglycerides <150 mg/dL - - - - -  Creatinine 0.50 - 1.05 mg/dL - - - - 0.87   BP/Weight 12/02/2019 03/20/2019 06/07/2018 07/27/2017 05/01/2017 04/18/2017 6/81/1572  Systolic BP 620 355 974 163 845 364 680  Diastolic BP 76 76 82 68 76 80 74  Wt. (Lbs) 174.12 167.08 175.04 177.75 182 182 177.12  BMI 31.34 28.68 31.01 31.49 32.24 32.24 31.38   Foot/eye exam completion dates Latest Ref Rng & Units 06/07/2018 11/01/2016  Eye Exam No Retinopathy - -  Foot Form Completion - Done Done

## 2019-12-03 ENCOUNTER — Other Ambulatory Visit: Payer: Self-pay | Admitting: Family Medicine

## 2019-12-03 DIAGNOSIS — Z1231 Encounter for screening mammogram for malignant neoplasm of breast: Secondary | ICD-10-CM

## 2019-12-04 LAB — CMP14+EGFR
ALT: 11 IU/L (ref 0–32)
AST: 16 IU/L (ref 0–40)
Albumin/Globulin Ratio: 1.5 (ref 1.2–2.2)
Albumin: 4.5 g/dL (ref 3.8–4.9)
Alkaline Phosphatase: 112 IU/L (ref 48–121)
BUN/Creatinine Ratio: 15 (ref 12–28)
BUN: 12 mg/dL (ref 8–27)
Bilirubin Total: 0.3 mg/dL (ref 0.0–1.2)
CO2: 29 mmol/L (ref 20–29)
Calcium: 10.3 mg/dL (ref 8.7–10.3)
Chloride: 96 mmol/L (ref 96–106)
Creatinine, Ser: 0.8 mg/dL (ref 0.57–1.00)
GFR calc Af Amer: 93 mL/min/{1.73_m2} (ref >59)
GFR calc non Af Amer: 80 mL/min/{1.73_m2} (ref >59)
Globulin, Total: 3 g/dL (ref 1.5–4.5)
Glucose: 86 mg/dL (ref 65–99)
Potassium: 4 mmol/L (ref 3.5–5.2)
Sodium: 141 mmol/L (ref 134–144)
Total Protein: 7.5 g/dL (ref 6.0–8.5)

## 2019-12-04 LAB — CBC
Hematocrit: 37.3 % (ref 34.0–46.6)
Hemoglobin: 12 g/dL (ref 11.1–15.9)
MCH: 22.9 pg — ABNORMAL LOW (ref 26.6–33.0)
MCHC: 32.2 g/dL (ref 31.5–35.7)
MCV: 71 fL — ABNORMAL LOW (ref 79–97)
Platelets: 283 10*3/uL (ref 150–450)
RBC: 5.25 x10E6/uL (ref 3.77–5.28)
RDW: 15.8 % — ABNORMAL HIGH (ref 11.7–15.4)
WBC: 8.4 10*3/uL (ref 3.4–10.8)

## 2019-12-04 LAB — LIPID PANEL
Chol/HDL Ratio: 2.2 ratio (ref 0.0–4.4)
Cholesterol, Total: 200 mg/dL — ABNORMAL HIGH (ref 100–199)
HDL: 90 mg/dL (ref >39)
LDL Chol Calc (NIH): 96 mg/dL (ref 0–99)
Triglycerides: 81 mg/dL (ref 0–149)
VLDL Cholesterol Cal: 14 mg/dL (ref 5–40)

## 2019-12-04 LAB — VITAMIN D 25 HYDROXY (VIT D DEFICIENCY, FRACTURES): Vit D, 25-Hydroxy: 19.9 ng/mL — ABNORMAL LOW (ref 30.0–100.0)

## 2019-12-04 LAB — TSH: TSH: 0.726 u[IU]/mL (ref 0.450–4.500)

## 2019-12-04 LAB — MICROALBUMIN, URINE: Microalbumin, Urine: <3 ug/mL

## 2019-12-06 ENCOUNTER — Other Ambulatory Visit: Payer: Self-pay

## 2019-12-06 ENCOUNTER — Ambulatory Visit
Admission: RE | Admit: 2019-12-06 | Discharge: 2019-12-06 | Disposition: A | Discharge status: Home / Self Care | Payer: BC Managed Care – PPO | Source: Ambulatory Visit | Attending: Family Medicine | Admitting: Family Medicine

## 2019-12-06 DIAGNOSIS — Z1231 Encounter for screening mammogram for malignant neoplasm of breast: Secondary | ICD-10-CM

## 2019-12-19 ENCOUNTER — Other Ambulatory Visit: Payer: Self-pay | Admitting: Family Medicine

## 2019-12-30 ENCOUNTER — Telehealth: Payer: Self-pay

## 2019-12-30 NOTE — Telephone Encounter (Signed)
Phone visit scheduled with Daisy Robbins 12-31-19 at 3:40

## 2019-12-30 NOTE — Telephone Encounter (Signed)
Pt had a cold, but is better, just has the lingering cough, can you call her in something.  Or does she need to be seen?

## 2019-12-31 ENCOUNTER — Other Ambulatory Visit: Payer: Self-pay

## 2019-12-31 ENCOUNTER — Telehealth: Payer: BC Managed Care – PPO | Admitting: Family Medicine

## 2020-01-02 ENCOUNTER — Encounter: Payer: BC Managed Care – PPO | Admitting: Family Medicine

## 2020-01-29 ENCOUNTER — Encounter: Payer: Self-pay | Admitting: Family Medicine

## 2020-01-29 ENCOUNTER — Telehealth (INDEPENDENT_AMBULATORY_CARE_PROVIDER_SITE_OTHER): Payer: BC Managed Care – PPO | Admitting: Family Medicine

## 2020-01-29 ENCOUNTER — Other Ambulatory Visit: Payer: Self-pay

## 2020-01-29 VITALS — BP 122/76 | Resp 16 | Ht 63.0 in | Wt 174.0 lb

## 2020-01-29 DIAGNOSIS — J309 Allergic rhinitis, unspecified: Secondary | ICD-10-CM

## 2020-01-29 DIAGNOSIS — R059 Cough, unspecified: Secondary | ICD-10-CM

## 2020-01-29 DIAGNOSIS — R05 Cough: Secondary | ICD-10-CM

## 2020-01-29 DIAGNOSIS — R062 Wheezing: Secondary | ICD-10-CM | POA: Diagnosis not present

## 2020-01-29 DIAGNOSIS — I10 Essential (primary) hypertension: Secondary | ICD-10-CM

## 2020-01-29 MED ORDER — BENZONATATE 100 MG PO CAPS
100.0000 mg | ORAL_CAPSULE | Freq: Two times a day (BID) | ORAL | 0 refills | Status: DC | PRN
Start: 2020-01-29 — End: 2020-08-17

## 2020-01-29 MED ORDER — PROMETHAZINE-DM 6.25-15 MG/5ML PO SYRP: ORAL_SOLUTION | ORAL | 0 refills | Status: DC

## 2020-01-29 MED ORDER — PREDNISONE 5 MG (21) PO TBPK: 5.0000 mg | ORAL_TABLET | ORAL | 0 refills | Status: DC

## 2020-01-29 MED ORDER — ALBUTEROL SULFATE HFA 108 (90 BASE) MCG/ACT IN AERS: 2.0000 | INHALATION_SPRAY | Freq: Four times a day (QID) | RESPIRATORY_TRACT | 0 refills | Status: DC | PRN

## 2020-01-29 NOTE — Assessment & Plan Note (Signed)
1 month h/o increased wheezing, worse at might, albuterol and pred dose pack prescribed

## 2020-01-29 NOTE — Progress Notes (Signed)
Virtual Visit via Telephone Note  I connected with Daisy Robbins on 01/29/20 at 10:40 AM EDT by video  and verified that I am speaking with the correct person using two identifiers.  Location: Patient: daughter's home Provider: office   I discussed the limitations, risks, security and privacy concerns of performing an evaluation and management service by telephone and the availability of in person appointments. I also discussed with the patient that there may be a patient responsible charge related to this service. The patient expressed understanding and agreed to proceed.   History of Present Illness: Caught a cold from her grandchildren about 1 month ago, never fever or chills, mainly runny nose , cough and chest congestion, no sputum, wheezing esp at night Denies , palpitations and leg swelling Denies abdominal pain, nausea, vomiting,diarrhea or constipation.   Denies dysuria, frequency, hesitancy or incontinence. Denies joint pain, swelling and limitation in mobility.       Observations/Objective: BP 122/76   Resp 16   Ht 5\' 3"  (1.6 m)   Wt 174 lb (78.9 kg)   BMI 30.82 kg/m  Good communication with no confusion and intact memory. Alert and oriented x 3 No signs of respiratory distress during speech    Assessment and Plan: Wheezing 1 month h/o increased wheezing, worse at might, albuterol and pred dose pack prescribed  Cough Non p[roductive x 1 month. Tessalon perles and phenergan dm as needed  ESSENTIAL HYPERTENSION, BENIGN Controlled, no change in medication   Allergic rhinitis Uncontrolled, prednisone dose pack    Follow Up Instructions:    I discussed the assessment and treatment plan with the patient. The patient was provided an opportunity to ask questions and all were answered. The patient agreed with the plan and demonstrated an understanding of the instructions.   The patient was advised to call back or seek an in-person evaluation if the  symptoms worsen or if the condition fails to improve as anticipated.  I provided 10 minutes of non-face-to-face time during this encounter.   Tula Nakayama, MD

## 2020-01-29 NOTE — Patient Instructions (Addendum)
F/U in October as before, call if you need me sooner, pap at visit   You are treated for allergic coaugh/ chronic bronchitis. Also for uncontrolled asthma  Tessalon perles, prednisone dose pack, albuterol inhaler and phenergan dM are prescribed  If symptoms worsen or do not resolve in next 2 weeks, please call back  It is important that you exercise regularly at least 30 minutes 5 times a week. If you develop chest pain, have severe difficulty breathing, or feel very tired, stop exercising immediately and seek medical attention  Think about what you will eat, plan ahead. Choose " clean, green, fresh or frozen" over canned, processed or packaged foods which are more sugary, salty and fatty. 70 to 75% of food eaten should be vegetables and fruit. Three meals at set times with snacks allowed between meals, but they must be fruit or vegetables. Aim to eat over a 12 hour period , example 7 am to 7 pm, and STOP after  your last meal of the day. Drink water,generally about 64 ounces per day, no other drink is as healthy. Fruit juice is best enjoyed in a healthy way, by EATING the fruit. Thanks for choosing [facility], we consider it a privelige to serve you.

## 2020-01-29 NOTE — Assessment & Plan Note (Signed)
Non p[roductive x 1 month. Tessalon perles and phenergan dm as needed

## 2020-01-31 ENCOUNTER — Encounter: Payer: Self-pay | Admitting: Family Medicine

## 2020-01-31 NOTE — Assessment & Plan Note (Signed)
Controlled, no change in medication  

## 2020-01-31 NOTE — Assessment & Plan Note (Signed)
Uncontrolled, prednisone dose pack

## 2020-02-21 ENCOUNTER — Other Ambulatory Visit: Payer: Self-pay | Admitting: Family Medicine

## 2020-02-26 ENCOUNTER — Other Ambulatory Visit: Payer: Self-pay | Admitting: Family Medicine

## 2020-03-17 ENCOUNTER — Ambulatory Visit: Payer: BC Managed Care – PPO | Admitting: Family Medicine

## 2020-03-26 ENCOUNTER — Telehealth: Payer: Self-pay

## 2020-03-27 ENCOUNTER — Ambulatory Visit: Payer: BC Managed Care – PPO

## 2020-04-03 ENCOUNTER — Other Ambulatory Visit: Payer: Self-pay

## 2020-04-03 ENCOUNTER — Ambulatory Visit (INDEPENDENT_AMBULATORY_CARE_PROVIDER_SITE_OTHER): Payer: BC Managed Care – PPO

## 2020-04-03 DIAGNOSIS — Z23 Encounter for immunization: Secondary | ICD-10-CM

## 2020-04-22 ENCOUNTER — Other Ambulatory Visit: Payer: Self-pay | Admitting: Family Medicine

## 2020-06-16 ENCOUNTER — Other Ambulatory Visit: Payer: Self-pay | Admitting: Family Medicine

## 2020-06-25 NOTE — Telephone Encounter (Signed)
error 

## 2020-07-22 LAB — HM DIABETES EYE EXAM

## 2020-08-17 ENCOUNTER — Other Ambulatory Visit: Payer: Self-pay

## 2020-08-17 ENCOUNTER — Telehealth (INDEPENDENT_AMBULATORY_CARE_PROVIDER_SITE_OTHER): Payer: BC Managed Care – PPO | Admitting: Family Medicine

## 2020-08-17 ENCOUNTER — Encounter: Payer: Self-pay | Admitting: Family Medicine

## 2020-08-17 VITALS — Ht 62.0 in | Wt 174.0 lb

## 2020-08-17 DIAGNOSIS — J309 Allergic rhinitis, unspecified: Secondary | ICD-10-CM | POA: Diagnosis not present

## 2020-08-17 DIAGNOSIS — I10 Essential (primary) hypertension: Secondary | ICD-10-CM | POA: Diagnosis not present

## 2020-08-17 DIAGNOSIS — E1159 Type 2 diabetes mellitus with other circulatory complications: Secondary | ICD-10-CM

## 2020-08-17 DIAGNOSIS — R059 Cough, unspecified: Secondary | ICD-10-CM

## 2020-08-17 DIAGNOSIS — E785 Hyperlipidemia, unspecified: Secondary | ICD-10-CM | POA: Diagnosis not present

## 2020-08-17 DIAGNOSIS — Z1231 Encounter for screening mammogram for malignant neoplasm of breast: Secondary | ICD-10-CM

## 2020-08-17 DIAGNOSIS — E559 Vitamin D deficiency, unspecified: Secondary | ICD-10-CM

## 2020-08-17 DIAGNOSIS — E669 Obesity, unspecified: Secondary | ICD-10-CM

## 2020-08-17 MED ORDER — LORATADINE 10 MG PO TABS: 10.0000 mg | ORAL_TABLET | Freq: Every day | ORAL | 5 refills | Status: DC

## 2020-08-17 MED ORDER — BENZONATATE 100 MG PO CAPS: 100.0000 mg | ORAL_CAPSULE | Freq: Two times a day (BID) | ORAL | 1 refills | Status: DC | PRN

## 2020-08-17 MED ORDER — PROMETHAZINE-DM 6.25-15 MG/5ML PO SYRP: ORAL_SOLUTION | ORAL | 0 refills | Status: DC

## 2020-08-17 NOTE — Progress Notes (Signed)
Virtual Visit via Telephone Note  I connected with [place] on 08/17/20 at  9:20 AM EDT by telephone and verified that I am speaking with the correct person using two identifiers.  Location: Patient: outside of her home Provider: office   I discussed the limitations, risks, security and privacy concerns of performing an evaluation and management service by telephone and the availability of in person appointments. I also discussed with the patient that there may be a patient responsible charge related to this service. The patient expressed understanding and agreed to proceed.   History of Present Illness: F/U chronic problems and address any new or current concerns. Review and update medications and allergies. Review recent lab and radiologic data . Update routine health maintainace. Review an encourage improved health habits to include nutrition, exercise and  sleep .  Denies recent fever or chills. Increased allergy and cough symptoms with season change to Spring, which is not uncommon, requests additional medication for her symptoms Denies polyuria, polydipsia, blurred vision , or hypoglycemic episodes. C/o worsening hot flashes, no interest in effexor, will continue " natural approach"  Denies, ear pain or sore throat. Denies chest congestion, productive cough or wheezing. Denies chest pains, palpitations and leg swelling Denies abdominal pain, nausea, vomiting,diarrhea or constipation.   Denies dysuria, frequency, hesitancy or incontinence. Denies joint pain, swelling and limitation in mobility. Denies headaches, seizures, numbness, or tingling. Denies depression, anxiety or insomnia. Denies skin break down or rash.        Observations/Objective: Ht 5\' 2"  (1.575 m)   Wt 174 lb (78.9 kg)   BMI 31.83 kg/m  Good communication with no confusion and intact memory. Alert and oriented x 3 No signs of respiratory distress during speech    Assessment and  Plan: Allergic rhinitis Increased and uncontrolled symptoms , add Claritin, also limited course of tessalon perles and phenergan DM prescribed for as needed   Use with uncontrolled cough  ESSENTIAL HYPERTENSION, BENIGN Controlled, no change in medication DASH diet and commitment to daily physical activity for a minimum of 30 minutes discussed and encouraged, as a part of hypertension management. The importance of attaining a healthy weight is also discussed.  BP/Weight 08/17/2020 01/29/2020 12/02/2019 03/20/2019 06/07/2018 07/27/2017 58/02/9832  Systolic BP - 825 053 976 734 193 790  Diastolic BP - 76 76 76 82 68 76  Wt. (Lbs) 174 174 174.12 167.08 175.04 177.75 182  BMI 31.83 30.82 31.34 28.68 31.01 31.49 32.24       Hyperlipidemia LDL goal <100 Hyperlipidemia:Low fat diet discussed and encouraged.   Lipid Panel  Lab Results  Component Value Date   CHOL 200 (H) 12/02/2019   HDL 90 12/02/2019   LDLCALC 96 12/02/2019   TRIG 81 12/02/2019   CHOLHDL 2.2 12/02/2019     Updated lab needed at/ before next visit.   Type 2 diabetes mellitus with vascular disease ([place]) Ms. Wubben is reminded of the importance of commitment to daily physical activity for 30 minutes or more, as able and the need to limit carbohydrate intake to 30 to 60 grams per meal to help with blood sugar control.   The need to take medication as prescribed, test blood sugar as directed, and to call between visits if there is a concern that blood sugar is uncontrolled is also discussed.   Ms. Theriault is reminded of the importance of daily foot exam, annual eye examination, and good blood sugar, blood pressure and cholesterol control.  Diabetic Labs Latest Ref Rng &  Units 12/02/2019 12/02/2019 12/02/2019 12/02/2019 12/19/2018  HbA1c 4.0 - 5.6 % 6.7(A) 6.7 6.7 6.7(A) 6.6(H)  Microalbumin mg/dL - - - - -  Micro/Creat Ratio <30 mcg/mg creat - - - - -  Chol 100 - 199 mg/dL 200(H) - - - -  HDL >39 mg/dL 90 - - - -  Calc LDL 0 -  99 mg/dL 96 - - - -  Triglycerides 0 - 149 mg/dL 81 - - - -  Creatinine 0.57 - 1.00 mg/dL 0.80 - - - 0.87   BP/Weight 08/17/2020 01/29/2020 12/02/2019 03/20/2019 06/07/2018 07/27/2017 95/28/4132  Systolic BP - 440 102 725 366 440 347  Diastolic BP - 76 76 76 82 68 76  Wt. (Lbs) 174 174 174.12 167.08 175.04 177.75 182  BMI 31.83 30.82 31.34 28.68 31.01 31.49 32.24   Foot/eye exam completion dates Latest Ref Rng & Units 07/22/2020 12/02/2019  Eye Exam No Retinopathy No Retinopathy -  Foot Form Completion - - Done      Updated lab needed at/ before next visit.   Cough Currently increased, phenergan dm as needed, limited supply  Obesity (BMI 30.0-34.9)  Patient re-educated about  the importance of commitment to a  minimum of 150 minutes of exercise per week as able.  The importance of healthy food choices with portion control discussed, as well as eating regularly and within a 12 hour window most days. The need to choose "clean , green" food 50 to 75% of the time is discussed, as well as to make water the primary drink and set a goal of 64 ounces water daily.    Weight /BMI 08/17/2020 01/29/2020 12/02/2019  WEIGHT 174 lb 174 lb 174 lb 1.9 oz  HEIGHT 5\' 2"  5\' 3"  5' 2.5"  BMI 31.83 kg/m2 30.82 kg/m2 31.34 kg/m2        Follow Up Instructions:    I discussed the assessment and treatment plan with the patient. The patient was provided an opportunity to ask questions and all were answered. The patient agreed with the plan and demonstrated an understanding of the instructions.   The patient was advised to call back or seek an in-person evaluation if the symptoms worsen or if the condition fails to improve as anticipated.  I provided 22  minutes of non-face-to-face time during this encounter.   Tula Nakayama, MD

## 2020-08-17 NOTE — Patient Instructions (Addendum)
Annual physical exam with MD in office in mid July, call if you need me sooner  Please get fasting lipid, cmp and EGFr and hBA1C and vit D  this week, they are overdue due  Please schedule mammogram due July 3 or after , at checkout  For hot flashes , first line medication as we discussed is effexor, call if you decide that you want to try this please  New additional medication for allergies is loratidine one daily, this is generic claritin  It is important that you exercise regularly at least 30 minutes 5 times a week. If you develop chest pain, have severe difficulty breathing, or feel very tired, stop exercising immediately and seek medical attention  Think about what you will eat, plan ahead. Choose " clean, green, fresh or frozen" over canned, processed or packaged foods which are more sugary, salty and fatty. 70 to 75% of food eaten should be vegetables and fruit. Three meals at set times with snacks allowed between meals, but they must be fruit or vegetables. Aim to eat over a 12 hour period , example 7 am to 7 pm, and STOP after  your last meal of the day. Drink water,generally about 64 ounces per day, no other drink is as healthy. Fruit juice is best enjoyed in a healthy way, by EATING the fruit. Thanks for choosing [facility], we consider it a privelige to serve you.      

## 2020-08-19 ENCOUNTER — Ambulatory Visit: Payer: BC Managed Care – PPO | Admitting: Family Medicine

## 2020-08-21 ENCOUNTER — Encounter: Payer: Self-pay | Admitting: Family Medicine

## 2020-08-21 NOTE — Assessment & Plan Note (Signed)
Controlled, no change in medication DASH diet and commitment to daily physical activity for a minimum of 30 minutes discussed and encouraged, as a part of hypertension management. The importance of attaining a healthy weight is also discussed.  BP/Weight 08/17/2020 01/29/2020 12/02/2019 03/20/2019 06/07/2018 07/27/2017 87/21/5872  Systolic BP - 761 848 592 763 943 200  Diastolic BP - 76 76 76 82 68 76  Wt. (Lbs) 174 174 174.12 167.08 175.04 177.75 182  BMI 31.83 30.82 31.34 28.68 31.01 31.49 32.24

## 2020-08-21 NOTE — Assessment & Plan Note (Signed)
Currently increased, phenergan dm as needed, limited supply

## 2020-08-21 NOTE — Assessment & Plan Note (Signed)
  Patient re-educated about  the importance of commitment to a  minimum of 150 minutes of exercise per week as able.  The importance of healthy food choices with portion control discussed, as well as eating regularly and within a 12 hour window most days. The need to choose "clean , green" food 50 to 75% of the time is discussed, as well as to make water the primary drink and set a goal of 64 ounces water daily.    Weight /BMI 08/17/2020 01/29/2020 12/02/2019  WEIGHT 174 lb 174 lb 174 lb 1.9 oz  HEIGHT 5\' 2"  5\' 3"  5' 2.5"  BMI 31.83 kg/m2 30.82 kg/m2 31.34 kg/m2

## 2020-08-21 NOTE — Assessment & Plan Note (Signed)
Daisy Robbins is reminded of the importance of commitment to daily physical activity for 30 minutes or more, as able and the need to limit carbohydrate intake to 30 to 60 grams per meal to help with blood sugar control.   The need to take medication as prescribed, test blood sugar as directed, and to call between visits if there is a concern that blood sugar is uncontrolled is also discussed.   Daisy Robbins is reminded of the importance of daily foot exam, annual eye examination, and good blood sugar, blood pressure and cholesterol control.  Diabetic Labs Latest Ref Rng & Units 12/02/2019 12/02/2019 12/02/2019 12/02/2019 12/19/2018  HbA1c 4.0 - 5.6 % 6.7(A) 6.7 6.7 6.7(A) 6.6(H)  Microalbumin mg/dL - - - - -  Micro/Creat Ratio <30 mcg/mg creat - - - - -  Chol 100 - 199 mg/dL 200(H) - - - -  HDL >39 mg/dL 90 - - - -  Calc LDL 0 - 99 mg/dL 96 - - - -  Triglycerides 0 - 149 mg/dL 81 - - - -  Creatinine 0.57 - 1.00 mg/dL 0.80 - - - 0.87   BP/Weight 08/17/2020 01/29/2020 12/02/2019 03/20/2019 06/07/2018 07/27/2017 65/79/0383  Systolic BP - 338 329 191 660 600 459  Diastolic BP - 76 76 76 82 68 76  Wt. (Lbs) 174 174 174.12 167.08 175.04 177.75 182  BMI 31.83 30.82 31.34 28.68 31.01 31.49 32.24   Foot/eye exam completion dates Latest Ref Rng & Units 07/22/2020 12/02/2019  Eye Exam No Retinopathy No Retinopathy -  Foot Form Completion - - Done      Updated lab needed at/ before next visit.

## 2020-08-21 NOTE — Assessment & Plan Note (Signed)
Hyperlipidemia:Low fat diet discussed and encouraged.   Lipid Panel  Lab Results  Component Value Date   CHOL 200 (H) 12/02/2019   HDL 90 12/02/2019   LDLCALC 96 12/02/2019   TRIG 81 12/02/2019   CHOLHDL 2.2 12/02/2019     Updated lab needed at/ before next visit.

## 2020-08-21 NOTE — Assessment & Plan Note (Signed)
Increased and uncontrolled symptoms , add Claritin, also limited course of tessalon perles and phenergan DM prescribed for as needed   Use with uncontrolled cough

## 2020-09-11 ENCOUNTER — Other Ambulatory Visit: Payer: Self-pay | Admitting: Family Medicine

## 2020-09-15 ENCOUNTER — Telehealth: Payer: Self-pay

## 2020-09-15 NOTE — Telephone Encounter (Signed)
Patient called need coupon attached to this med before she can pick it up [place] 50-1000 MG TB24   Pharmacy: [facility] [place]

## 2020-09-15 NOTE — Telephone Encounter (Signed)
Activated coupon for pt and faxed to [facility]

## 2020-09-16 NOTE — Telephone Encounter (Signed)
She asked for a coupon off the [employer] which is what I faxed to the pharmacy. Not sure what the issue is, called and lvm at [facility] for them to call me back and let me know.

## 2020-09-16 NOTE — Telephone Encounter (Signed)
Pt.notified

## 2020-09-16 NOTE — Telephone Encounter (Signed)
Patient called said the coupon does not work. Please return patient call [phone].

## 2020-09-17 ENCOUNTER — Other Ambulatory Visit: Payer: Self-pay

## 2020-09-17 NOTE — Telephone Encounter (Signed)
Never heard back from [facility]. Talked to pt and sent another coupon and asked that if there was a problem if they would please let us know

## 2020-09-19 LAB — CMP14+EGFR
ALT: 17 IU/L (ref 0–32)
AST: 21 IU/L (ref 0–40)
Albumin/Globulin Ratio: 1.6 (ref 1.2–2.2)
Albumin: 4.6 g/dL (ref 3.8–4.8)
Alkaline Phosphatase: 97 IU/L (ref 44–121)
BUN/Creatinine Ratio: 13 (ref 12–28)
BUN: 12 mg/dL (ref 8–27)
Bilirubin Total: 0.3 mg/dL (ref 0.0–1.2)
CO2: 25 mmol/L (ref 20–29)
Calcium: 10.3 mg/dL (ref 8.7–10.3)
Chloride: 96 mmol/L (ref 96–106)
Creatinine, Ser: 0.95 mg/dL (ref 0.57–1.00)
Globulin, Total: 2.9 g/dL (ref 1.5–4.5)
Glucose: 117 mg/dL — ABNORMAL HIGH (ref 65–99)
Potassium: 4.1 mmol/L (ref 3.5–5.2)
Sodium: 141 mmol/L (ref 134–144)
Total Protein: 7.5 g/dL (ref 6.0–8.5)
eGFR: 68 mL/min/{1.73_m2} (ref >59)

## 2020-09-19 LAB — LIPID PANEL
Chol/HDL Ratio: 2.3 ratio (ref 0.0–4.4)
Cholesterol, Total: 177 mg/dL (ref 100–199)
HDL: 76 mg/dL (ref >39)
LDL Chol Calc (NIH): 89 mg/dL (ref 0–99)
Triglycerides: 62 mg/dL (ref 0–149)
VLDL Cholesterol Cal: 12 mg/dL (ref 5–40)

## 2020-09-19 LAB — VITAMIN D 25 HYDROXY (VIT D DEFICIENCY, FRACTURES): Vit D, 25-Hydroxy: 38.3 ng/mL (ref 30.0–100.0)

## 2020-09-19 LAB — HEMOGLOBIN A1C
Est. average glucose Bld gHb Est-mCnc: 154 mg/dL
Hgb A1c MFr Bld: 7 % — ABNORMAL HIGH (ref 4.8–5.6)

## 2020-10-08 ENCOUNTER — Other Ambulatory Visit: Payer: Self-pay | Admitting: Family Medicine

## 2020-10-12 ENCOUNTER — Telehealth: Payer: Medicare Other | Admitting: Family Medicine

## 2020-10-12 ENCOUNTER — Other Ambulatory Visit: Payer: Self-pay

## 2020-10-12 ENCOUNTER — Encounter: Payer: Self-pay | Admitting: Family Medicine

## 2020-10-12 VITALS — Ht 63.0 in | Wt 165.0 lb

## 2020-10-12 DIAGNOSIS — I10 Essential (primary) hypertension: Secondary | ICD-10-CM

## 2020-10-12 DIAGNOSIS — E1159 Type 2 diabetes mellitus with other circulatory complications: Secondary | ICD-10-CM

## 2020-10-12 DIAGNOSIS — R062 Wheezing: Secondary | ICD-10-CM | POA: Diagnosis not present

## 2020-10-12 DIAGNOSIS — R059 Cough, unspecified: Secondary | ICD-10-CM | POA: Diagnosis not present

## 2020-10-12 DIAGNOSIS — E785 Hyperlipidemia, unspecified: Secondary | ICD-10-CM | POA: Diagnosis not present

## 2020-10-12 MED ORDER — ALBUTEROL SULFATE HFA 108 (90 BASE) MCG/ACT IN AERS: 2.0000 | INHALATION_SPRAY | Freq: Four times a day (QID) | RESPIRATORY_TRACT | 3 refills | Status: DC | PRN

## 2020-10-12 MED ORDER — PREDNISONE 5 MG (21) PO TBPK: 5.0000 mg | ORAL_TABLET | ORAL | 0 refills | Status: DC

## 2020-10-12 NOTE — Patient Instructions (Addendum)
Please change annual exam to mid to end September, call if you need me sooner  Albuterol and prednisone are prescribed for uncontrolled asthma symptoms  Please get fasting lipid, cmp and EGFR, HBA1C, CBC and microal 1 week before September appointment  It is important that you exercise regularly at least 30 minutes 5 times a week. If you develop chest pain, have severe difficulty breathing, or feel very tired, stop exercising immediately and seek medical attention    Think about what you will eat, plan ahead. Choose " clean, green, fresh or frozen" over canned, processed or packaged foods which are more sugary, salty and fatty. 70 to 75% of food eaten should be vegetables and fruit. Three meals at set times with snacks allowed between meals, but they must be fruit or vegetables. Aim to eat over a 12 hour period , example 7 am to 7 pm, and STOP after  your last meal of the day. Drink water,generally about 64 ounces per day, no other drink is as healthy. Fruit juice is best enjoyed in a healthy way, by EATING the fruit.  Thanks for choosing [facility], we consider it a privelige to serve you.

## 2020-10-12 NOTE — Progress Notes (Addendum)
Virtual Visit via Telephone Note  I connected with Webb on 10/12/20 at  2:20 PM EDT by telephone and verified that I am speaking with the correct person using two identifiers.  Location: Patient: daughter's home Provider: office   I discussed the limitations, risks, security and privacy concerns of performing an evaluation and management service by telephone and the availability of in person appointments. I also discussed with the patient that there may be a patient responsible charge related to this service. The patient expressed understanding and agreed to proceed.   History of Present Illness: 1 week h/o wheeze and cough, , not prodructive most of the time, no significant sinus pressure or drainage. No fever or chills No other concerns Denies polyuria, polydipsia, blurred vision , or hypoglycemic episodes.    Observations/Objective: Ht 5\' 3"  (1.6 m)   Wt 165 lb (74.8 kg)   BMI 29.23 kg/m  Good communication with no confusion and intact memory. Alert and oriented x 3 No signs of respiratory distress during speech    Assessment and Plan:  Wheezing Uncontrolled, prednisone and albuterol prescribed, go to urgent care if worsens  Cough Uncontrolled due to flare of RAD, symptomatic med prescribed  Type 2 diabetes mellitus with vascular disease ([place]) Updated lab needed at/ before next visit. Controlled, no change in medication   ESSENTIAL HYPERTENSION, BENIGN DASH diet and commitment to daily physical activity for a minimum of 30 minutes discussed and encouraged, as a part of hypertension management. The importance of attaining a healthy weight is also discussed.  BP/Weight 10/12/2020 08/17/2020 01/29/2020 12/02/2019 03/20/2019 06/07/2018 9/47/6546  Systolic BP - - 503 546 568 127 517  Diastolic BP - - 76 76 76 82 68  Wt. (Lbs) 165 174 174 174.12 167.08 175.04 177.75  BMI 29.23 31.83 30.82 31.34 28.68 31.01 31.49         Follow Up Instructions:    I  discussed the assessment and treatment plan with the patient. The patient was provided an opportunity to ask questions and all were answered. The patient agreed with the plan and demonstrated an understanding of the instructions.   The patient was advised to call back or seek an in-person evaluation if the symptoms worsen or if the condition fails to improve as anticipated.  I provided 15 minutes of non-face-to-face time during this encounter.   Tula Nakayama, MD

## 2020-10-14 NOTE — Assessment & Plan Note (Signed)
Uncontrolled, prednisone and albuterol prescribed, go to urgent care if worsens

## 2020-10-14 NOTE — Assessment & Plan Note (Signed)
Updated lab needed at/ before next visit. Controlled, no change in medication  

## 2020-10-14 NOTE — Assessment & Plan Note (Signed)
Uncontrolled due to flare of RAD, symptomatic med prescribed

## 2020-10-14 NOTE — Assessment & Plan Note (Signed)
DASH diet and commitment to daily physical activity for a minimum of 30 minutes discussed and encouraged, as a part of hypertension management. The importance of attaining a healthy weight is also discussed.  BP/Weight 10/12/2020 08/17/2020 01/29/2020 12/02/2019 03/20/2019 06/07/2018 0/16/0109  Systolic BP - - 323 557 322 025 427  Diastolic BP - - 76 76 76 82 68  Wt. (Lbs) 165 174 174 174.12 167.08 175.04 177.75  BMI 29.23 31.83 30.82 31.34 28.68 31.01 31.49

## 2020-11-04 ENCOUNTER — Other Ambulatory Visit: Payer: Self-pay | Admitting: Family Medicine

## 2020-12-10 ENCOUNTER — Inpatient Hospital Stay (HOSPITAL_COMMUNITY): Admission: RE | Admit: 2020-12-10 | Payer: BC Managed Care – PPO | Source: Ambulatory Visit

## 2020-12-17 ENCOUNTER — Encounter: Payer: BC Managed Care – PPO | Admitting: Family Medicine

## 2021-02-02 ENCOUNTER — Other Ambulatory Visit: Payer: Self-pay | Admitting: Family Medicine

## 2021-03-03 ENCOUNTER — Encounter (INDEPENDENT_AMBULATORY_CARE_PROVIDER_SITE_OTHER): Payer: Self-pay

## 2021-03-03 ENCOUNTER — Encounter: Payer: Self-pay | Admitting: Family Medicine

## 2021-03-03 ENCOUNTER — Ambulatory Visit (INDEPENDENT_AMBULATORY_CARE_PROVIDER_SITE_OTHER): Payer: Medicare HMO | Admitting: Family Medicine

## 2021-03-03 ENCOUNTER — Other Ambulatory Visit: Payer: Self-pay

## 2021-03-03 VITALS — BP 134/82 | HR 89 | Resp 16 | Ht 63.0 in | Wt 172.0 lb

## 2021-03-03 DIAGNOSIS — E1159 Type 2 diabetes mellitus with other circulatory complications: Secondary | ICD-10-CM

## 2021-03-03 DIAGNOSIS — Z0000 Encounter for general adult medical examination without abnormal findings: Secondary | ICD-10-CM

## 2021-03-03 DIAGNOSIS — Z0001 Encounter for general adult medical examination with abnormal findings: Secondary | ICD-10-CM

## 2021-03-03 DIAGNOSIS — Z23 Encounter for immunization: Secondary | ICD-10-CM | POA: Diagnosis not present

## 2021-03-03 NOTE — Assessment & Plan Note (Signed)

## 2021-03-03 NOTE — Patient Instructions (Addendum)
Annual exam in office in 366 days, call if you need me sooner  Please schedule mammogram 3 or after at checkout  Welcome to medicare in early January, please bring all medication to the visit  Need shingrix vaccines and covid booster, check with you pharmacy  Flu vaccine today  Fasting labs already ordered in next 1 week, INCLUDES urine specimen, please let her know  Take calcium with D 1000 mg calcium and 1000 IU vit D3 every day for bone health  Take aspirin 81 mg every day for heart health  It is important that you exercise regularly at least 30 minutes 5 times a week. If you develop chest pain, have severe difficulty breathing, or feel very tired, stop exercising immediately and seek medical attention  Think about what you will eat, plan ahead. Choose " clean, green, fresh or frozen" over canned, processed or packaged foods which are more sugary, salty and fatty. 70 to 75% of food eaten should be vegetables and fruit. Three meals at set times with snacks allowed between meals, but they must be fruit or vegetables. Aim to eat over a 12 hour period , example 7 am to 7 pm, and STOP after  your last meal of the day. Drink water,generally about 64 ounces per day, no other drink is as healthy. Fruit juice is best enjoyed in a healthy way, by EATING the fruit. Thanks for choosing [facility], we consider it a privelige to serve you.

## 2021-03-03 NOTE — Progress Notes (Signed)
    Daisy Robbins     MRN: [id number]      DOB: 1960-08-27  HPI: Patient is in for annual physical exam. C/o right shoulder pain x 1 day, npo trauma Immunization is reviewed , and  updated .   PE: BP 134/82   Pulse 89   Resp 16   Ht 5\' 3"  (1.6 m)   Wt 172 lb (78 kg)   SpO2 96%   BMI 30.47 kg/m   Pleasant  female, alert and oriented x 3, in no cardio-pulmonary distress. Afebrile. HEENT No facial trauma or asymetry. Sinuses non tender.  Extra occullar muscles intact.. External ears normal, . Neck: supple, no adenopathy,JVD or thyromegaly.No bruits.  Chest: Clear to ascultation bilaterally.No crackles or wheezes. Non tender to palpation  Breast: No asymetry,no masses or lumps. No tenderness. No nipple discharge or inversion. No axillary or supraclavicular adenopathy  Cardiovascular system; Heart sounds normal,  S1 and  S2 ,no S3.  No murmur, or thrill. Apical beat not displaced Peripheral pulses normal.  Abdomen: Soft, non tender, no organomegaly or masses. No bruits. Bowel sounds normal. No guarding, tenderness or rebound.   GU: Deferred by patient  Musculoskeletal exam: Full ROM of spine, hips , shoulders and knees. No deformity ,swelling or crepitus noted. No muscle wasting or atrophy.   Neurologic: Cranial nerves 2 to 12 intact. Power, tone ,sensation and reflexes normal throughout. No disturbance in gait. No tremor.  Skin: Intact, no ulceration, erythema , scaling or rash noted. Pigmentation normal throughout  Psych; Normal mood and affect. Judgement and concentration normal   Assessment & Plan:  Annual physical exam Annual exam as documented. Counseling done  re healthy lifestyle involving commitment to 150 minutes exercise per week, heart healthy diet, and attaining healthy weight.The importance of adequate sleep also discussed. Regular seat belt use and home safety, is also discussed. Changes in health habits are decided on by the patient  with goals and time frames  set for achieving them. Immunization and cancer screening needs are specifically addressed at this visit.

## 2021-03-07 ENCOUNTER — Encounter: Payer: Self-pay | Admitting: Family Medicine

## 2021-03-07 NOTE — Assessment & Plan Note (Signed)
Daisy Robbins is reminded of the importance of commitment to daily physical activity for 30 minutes or more, as able and the need to limit carbohydrate intake to 30 to 60 grams per meal to help with blood sugar control.   The need to take medication as prescribed, test blood sugar as directed, and to call between visits if there is a concern that blood sugar is uncontrolled is also discussed.   Daisy Robbins is reminded of the importance of daily foot exam, annual eye examination, and good blood sugar, blood pressure and cholesterol control.  Diabetic Labs Latest Ref Rng & Units 09/18/2020 12/02/2019 12/02/2019 12/02/2019 12/02/2019  HbA1c 4.8 - 5.6 % 7.0(H) 6.7(A) 6.7 6.7 6.7(A)  Microalbumin mg/dL - - - - -  Micro/Creat Ratio <30 mcg/mg creat - - - - -  Chol 100 - 199 mg/dL 177 200(H) - - -  HDL >39 mg/dL 76 90 - - -  Calc LDL 0 - 99 mg/dL 89 96 - - -  Triglycerides 0 - 149 mg/dL 62 81 - - -  Creatinine 0.57 - 1.00 mg/dL 0.95 0.80 - - -   BP/Weight 03/03/2021 10/12/2020 08/17/2020 01/29/2020 12/02/2019 93/71/6967 01/12/3809  Systolic BP 175 - - 102 585 277 824  Diastolic BP 82 - - 76 76 76 82  Wt. (Lbs) 172 165 174 174 174.12 167.08 175.04  BMI 30.47 29.23 31.83 30.82 31.34 28.68 31.01   Foot/eye exam completion dates Latest Ref Rng & Units 07/22/2020 12/02/2019  Eye Exam No Retinopathy No Retinopathy -  Foot Form Completion - - Done      Updated lab needed at/ before next visit.

## 2021-03-11 DIAGNOSIS — J329 Chronic sinusitis, unspecified: Secondary | ICD-10-CM | POA: Diagnosis not present

## 2021-03-11 DIAGNOSIS — Z8249 Family history of ischemic heart disease and other diseases of the circulatory system: Secondary | ICD-10-CM | POA: Diagnosis not present

## 2021-03-11 DIAGNOSIS — Z809 Family history of malignant neoplasm, unspecified: Secondary | ICD-10-CM | POA: Diagnosis not present

## 2021-03-11 DIAGNOSIS — Z7984 Long term (current) use of oral hypoglycemic drugs: Secondary | ICD-10-CM | POA: Diagnosis not present

## 2021-03-11 DIAGNOSIS — Z833 Family history of diabetes mellitus: Secondary | ICD-10-CM | POA: Diagnosis not present

## 2021-03-11 DIAGNOSIS — I10 Essential (primary) hypertension: Secondary | ICD-10-CM | POA: Diagnosis not present

## 2021-03-11 DIAGNOSIS — E785 Hyperlipidemia, unspecified: Secondary | ICD-10-CM | POA: Diagnosis not present

## 2021-03-11 DIAGNOSIS — E119 Type 2 diabetes mellitus without complications: Secondary | ICD-10-CM | POA: Diagnosis not present

## 2021-03-11 DIAGNOSIS — G8929 Other chronic pain: Secondary | ICD-10-CM | POA: Diagnosis not present

## 2021-03-11 DIAGNOSIS — J45909 Unspecified asthma, uncomplicated: Secondary | ICD-10-CM | POA: Diagnosis not present

## 2021-03-15 ENCOUNTER — Ambulatory Visit (HOSPITAL_COMMUNITY): Payer: Medicare HMO

## 2021-04-08 ENCOUNTER — Other Ambulatory Visit: Payer: Self-pay | Admitting: Family Medicine

## 2021-04-08 ENCOUNTER — Ambulatory Visit (INDEPENDENT_AMBULATORY_CARE_PROVIDER_SITE_OTHER): Payer: Medicare HMO | Admitting: Family Medicine

## 2021-04-08 ENCOUNTER — Encounter: Payer: Self-pay | Admitting: Family Medicine

## 2021-04-08 ENCOUNTER — Other Ambulatory Visit: Payer: Self-pay

## 2021-04-08 DIAGNOSIS — R062 Wheezing: Secondary | ICD-10-CM | POA: Diagnosis not present

## 2021-04-08 MED ORDER — BENZONATATE 100 MG PO CAPS: 100.0000 mg | ORAL_CAPSULE | Freq: Two times a day (BID) | ORAL | 0 refills | Status: DC | PRN

## 2021-04-08 MED ORDER — PREDNISONE 5 MG (21) PO TBPK: 5.0000 mg | ORAL_TABLET | ORAL | 0 refills | Status: DC

## 2021-04-08 NOTE — Assessment & Plan Note (Signed)
1 week h/o acute flare, no symptoms of infection, prednisone dose pack and tessalon perle prescribed

## 2021-04-08 NOTE — Patient Instructions (Signed)
F/U as before, call if you need me sooner  You are  treated for acute asthma flare, prednisone and tessalon perles are prescribed  Call back if symptoms worsen or persist  Thanks for choosing [facility], we consider it a privelige to serve you.

## 2021-04-08 NOTE — Progress Notes (Signed)
Virtual Visit via Telephone Note  I connected with [place] on 04/08/21 at  4:40 PM EDT by telephone and verified that I am speaking with the correct person using two identifiers.  Location: Patient: home Provider: office   I discussed the limitations, risks, security and privacy concerns of performing an evaluation and management service by telephone and the availability of in person appointments. I also discussed with the patient that there may be a patient responsible charge related to this service. The patient expressed understanding and agreed to proceed.   History of Present Illness: 1 week h/o cough and chest congestion, denies fever, chills, body aches or discolored sputum Cough worse at night, denies sinus pressure or nasal drainage C/o wheezing which is relieved with albuterol   Observations/Objective: There were no vitals taken for this visit. Good communication with no confusion and intact memory. Alert and oriented x 3 No signs of respiratory distress during speech   Assessment and Plan:  Wheezing 1 week h/o acute flare, no symptoms of infection, prednisone dose pack and tessalon perle prescribed  Follow Up Instructions:    I discussed the assessment and treatment plan with the patient. The patient was provided an opportunity to ask questions and all were answered. The patient agreed with the plan and demonstrated an understanding of the instructions.   The patient was advised to call back or seek an in-person evaluation if the symptoms worsen or if the condition fails to improve as anticipated.  I provided 7 minutes of non-face-to-face time during this encounter.   Tula Nakayama, MD

## 2021-04-13 DIAGNOSIS — H524 Presbyopia: Secondary | ICD-10-CM | POA: Diagnosis not present

## 2021-04-13 DIAGNOSIS — Z0100 Encounter for examination of eyes and vision without abnormal findings: Secondary | ICD-10-CM | POA: Diagnosis not present

## 2021-05-20 ENCOUNTER — Encounter: Payer: Medicare HMO | Admitting: Family Medicine

## 2021-06-22 DIAGNOSIS — Z7984 Long term (current) use of oral hypoglycemic drugs: Secondary | ICD-10-CM | POA: Diagnosis not present

## 2021-06-22 DIAGNOSIS — Z961 Presence of intraocular lens: Secondary | ICD-10-CM | POA: Diagnosis not present

## 2021-06-22 DIAGNOSIS — H401131 Primary open-angle glaucoma, bilateral, mild stage: Secondary | ICD-10-CM | POA: Diagnosis not present

## 2021-06-22 DIAGNOSIS — H25811 Combined forms of age-related cataract, right eye: Secondary | ICD-10-CM | POA: Diagnosis not present

## 2021-06-22 DIAGNOSIS — H52203 Unspecified astigmatism, bilateral: Secondary | ICD-10-CM | POA: Diagnosis not present

## 2021-06-22 DIAGNOSIS — E1136 Type 2 diabetes mellitus with diabetic cataract: Secondary | ICD-10-CM | POA: Diagnosis not present

## 2021-06-22 DIAGNOSIS — H524 Presbyopia: Secondary | ICD-10-CM | POA: Diagnosis not present

## 2021-06-30 ENCOUNTER — Encounter (INDEPENDENT_AMBULATORY_CARE_PROVIDER_SITE_OTHER): Payer: Self-pay

## 2021-06-30 ENCOUNTER — Encounter: Payer: Self-pay | Admitting: Family Medicine

## 2021-06-30 ENCOUNTER — Other Ambulatory Visit: Payer: Self-pay

## 2021-06-30 ENCOUNTER — Ambulatory Visit (INDEPENDENT_AMBULATORY_CARE_PROVIDER_SITE_OTHER): Payer: Medicare HMO | Admitting: Family Medicine

## 2021-06-30 ENCOUNTER — Ambulatory Visit: Payer: Medicare HMO | Admitting: Family Medicine

## 2021-06-30 VITALS — BP 138/82 | HR 83 | Resp 16 | Ht 63.0 in | Wt 181.0 lb

## 2021-06-30 DIAGNOSIS — E1159 Type 2 diabetes mellitus with other circulatory complications: Secondary | ICD-10-CM | POA: Diagnosis not present

## 2021-06-30 DIAGNOSIS — E785 Hyperlipidemia, unspecified: Secondary | ICD-10-CM | POA: Diagnosis not present

## 2021-06-30 DIAGNOSIS — R9431 Abnormal electrocardiogram [ECG] [EKG]: Secondary | ICD-10-CM

## 2021-06-30 DIAGNOSIS — I10 Essential (primary) hypertension: Secondary | ICD-10-CM

## 2021-06-30 DIAGNOSIS — Z0000 Encounter for general adult medical examination without abnormal findings: Secondary | ICD-10-CM | POA: Insufficient documentation

## 2021-06-30 DIAGNOSIS — Z9189 Other specified personal risk factors, not elsewhere classified: Secondary | ICD-10-CM

## 2021-06-30 DIAGNOSIS — E559 Vitamin D deficiency, unspecified: Secondary | ICD-10-CM

## 2021-06-30 NOTE — Patient Instructions (Signed)
F/U in 4 months, call if you need me before  Please get shingrix vaccines at pharmacy  Lifestyle changes as discussed  Please add TSH and vit D level to labs drawn today  Please schedule mammogram at checkout, past due  Review and try to address advance directives as we discussed  You are referred to Cardiology  It is important that you exercise regularly at least 30 minutes 5 times a week. If you develop chest pain, have severe difficulty breathing, or feel very tired, stop exercising immediately and seek medical attention   Thanks for choosing [facility], we consider it a privelige to serve you.

## 2021-06-30 NOTE — Progress Notes (Signed)
Subjective:    Daisy Robbins is a 61 y.o. female who presents for a Welcome to Medicare exam.   Review of Systems  Cardiac Risk Factors include: diabetes mellitus;dyslipidemia;hypertension;sedentary lifestyle;obesity (BMI >30kg/m2)      Objective:    Today's Vitals   06/30/21 1454  BP: (!) 149/84  Pulse: 83  Resp: 16  SpO2: 94%  Weight: 181 lb (82.1 kg)  Height: 5\' 3"  (1.6 m)  PainSc: 0-No pain  Body mass index is 32.06 kg/m.  Medications Outpatient Encounter Medications as of 06/30/2021  Medication Sig   albuterol (VENTOLIN HFA) 108 (90 Base) MCG/ACT inhaler Inhale 2 puffs into the lungs every 6 (six) hours as needed for wheezing or shortness of breath.   benazepril-hydrochlorthiazide (LOTENSIN HCT) 20-12.5 MG tablet TAKE 1 AND A HALF TABLETS BY MOUTH EVERY DAY   JANUMET XR 50-1000 MG TB24 TAKE 2 TABLETS BY MOUTH EVERY DAY (HIGH COST HOLD)   loratadine (CLARITIN) 10 MG tablet TAKE 1 TABLET BY MOUTH EVERY DAY   montelukast (SINGULAIR) 10 MG tablet TAKE 1 TABLET BY MOUTH EVERYDAY AT BEDTIME   rosuvastatin (CRESTOR) 5 MG tablet TAKE ONE TABLET BY MOUTH EVERY MONDAY AND THURSDAY   [DISCONTINUED] benzonatate (TESSALON) 100 MG capsule Take 1 capsule (100 mg total) by mouth 2 (two) times daily as needed for cough.   [DISCONTINUED] predniSONE (STERAPRED UNI-PAK 21 TAB) 5 MG (21) TBPK tablet Take 1 tablet (5 mg total) by mouth as directed. Use as directed   No facility-administered encounter medications on file as of 06/30/2021.     History: Past Medical History:  Diagnosis Date   Allergy    Diabetes mellitus    Hypertension    Past Surgical History:  Procedure Laterality Date   ABDOMINAL HYSTERECTOMY     CESAREAN SECTION     COLONOSCOPY  03/01/2012   Procedure: COLONOSCOPY;  Surgeon: Rogene Houston, MD;  Location: [facility];  Service: Endoscopy;  Laterality: N/A;  950   right carpal tunnel release      Family History  Problem Relation Age of Onset    Hypertension Mother    Cancer Father        colon    Diabetes Sister    Colon cancer Neg Hx    Social History   Occupational History   Not on file  Tobacco Use   Smoking status: Never   Smokeless tobacco: Never  Substance and Sexual Activity   Alcohol use: No   Drug use: No   Sexual activity: Not on file    Tobacco Counseling Counseling given: Not Answered   Immunizations and Health Maintenance Immunization History  Administered Date(s) Administered   Influenza Whole 02/24/2009, 03/17/2009, 03/08/2010   Influenza,inj,Quad PF,6+ Mos 02/28/2013, 06/30/2014, 02/22/2016, 03/25/2018, 03/20/2019, 04/03/2020, 03/03/2021   Moderna Sars-Covid-2 Vaccination 08/06/2019, 09/03/2019, 04/06/2020, 03/06/2021   Pneumococcal Polysaccharide-23 10/30/2013   Tdap 11/03/2011   Health Maintenance Due  Topic Date Due   Zoster Vaccines- Shingrix (1 of 2) Never done   HEMOGLOBIN A1C  03/20/2021   COVID-19 Vaccine (5 - Booster for Moderna series) 05/01/2021    Activities of Daily Living In your present state of health, do you have any difficulty performing the following activities: 06/30/2021  Hearing? N  Vision? N  Difficulty concentrating or making decisions? N  Walking or climbing stairs? N  Dressing or bathing? N  Doing errands, shopping? N  Preparing Food and eating ? N  Using the Toilet? N  In the past  six months, have you accidently leaked urine? N  Do you have problems with loss of bowel control? N  Managing your Medications? N  Managing your Finances? N  Housekeeping or managing your Housekeeping? N  Some recent data might be hidden    Physical Exam  (optional), or other factors deemed appropriate based on the beneficiary's medical and social history and current clinical standards.  Advanced Directives:      Assessment:    This is a routine wellness examination for this patient .   Vision/Hearing screen Vision Screening   Right eye Left eye Both eyes  Without  correction 20/25 20/50 20/25   With correction       Dietary issues and exercise activities discussed:  Current Exercise Habits: The patient does not participate in regular exercise at present (hasn't been walking since the temps got cold), Exercise limited by: None identified   Goals   None    Depression Screen PHQ 2/9 Scores 06/30/2021 03/03/2021 10/12/2020 08/17/2020  PHQ - 2 Score 0 0 0 0  PHQ- 9 Score - - - -     Fall Risk Fall Risk  06/30/2021  Falls in the past year? 0  Number falls in past yr: 0  Injury with Fall? 0  Risk for fall due to : -  Follow up -    Cognitive Function:     6CIT Screen 06/30/2021  What Year? 0 points  What month? 0 points  What time? 0 points  Count back from 20 0 points    Patient Care Team: Fayrene Helper, MD as PCP - General     Plan:     I have personally reviewed and noted the following in the patients chart:   Medical and social history Use of alcohol, tobacco or illicit drugs  Current medications and supplements Functional ability and status Nutritional status Physical activity Advanced directives List of other physicians Hospitalizations, surgeries, and ER visits in previous 12 months Vitals Screenings to include cognitive, depression, and falls Referrals and appointments  In addition, I have reviewed and discussed with patient certain preventive protocols, quality metrics, and best practice recommendations. A written personalized care plan for preventive services as well as general preventive health recommendations were provided to patient.     Kate Sable, LPN, LPN 7/84/6962

## 2021-06-30 NOTE — Progress Notes (Signed)
Preventive Screening-Counseling & Management   Patient present here today for a Medicare annual wellness visit.   Current Problems (verified)   Medications Prior to Visit Allergies (verified)   PAST HISTORY  Family History. Mom dx CAD , and has CABG in mid 40's, hTN,  Father died at age 62 with metastatic prostate cancer Sister x 2 Brother died with covid, had 1 lung removed due to lung cancer , age 71  Social History  Married Mom of 4  adult children, never smoker, no alcohol , disabled at age `25 due to accident on the job   Risk Factors  Current exercise habits:  none,,goal is for 150 mins/ week  Dietary issues discussed:stop    Cardiac risk factors: DM, HTN, lack of exercise and obesirty  Depression Screen  (Note: if answer to either of the following is "Yes", a more complete depression screening is indicated)   Over the past two weeks, have you felt down, depressed or hopeless? No  Over the past two weeks, have you felt little interest or pleasure in doing things? No  Have you lost interest or pleasure in daily life? No  Do you often feel hopeless? No  Do you cry easily over simple problems? No   Activities of Daily Living  In your present state of health, do you have any difficulty performing the following activities?  Driving?: No Managing money?: No Feeding yourself?:No Getting from bed to chair?:No Climbing a flight of stairs?:No Preparing food and eating?:No Bathing or showering?:No Getting dressed?:No Getting to the toilet?:No Using the toilet?:No Moving around from place to place?: No  Fall Risk Assessment In the past year have you fallen or had a near fall?:No Are you currently taking any medications that make you dizzy?:No   Hearing Difficulties: No Do you often ask people to speak up or repeat themselves?:No Do you experience ringing or noises in your ears?:No Do you have difficulty understanding soft or whispered voices?:No  Cognitive  Testing  Alert? Yes Normal Appearance?Yes  Oriented to person? Yes Place? Yes  Time? Yes  Displays appropriate judgment?Yes  Can read the correct time from a watch face? yes Are you having problems remembering things?No  Advanced Directives have been discussed with the patient?Yes    List the Names of Other Physician/Practitioners you currently use:    Indicate any recent Medical Services you may have received from other than [facility] providers in the past year (date may be approximate).     Medicare Attestation  I have personally reviewed:  The patient's medical and social history  Their use of alcohol, tobacco or illicit drugs  Their current medications and supplements  The patient's functional ability including ADLs,fall risks, home safety risks, cognitive, and hearing and visual impairment  Diet and physical activities  Evidence for depression or mood disorders  The patient's weight, height, BMI, and visual acuity have been recorded in the chart. I have made referrals, counseling, and provided education to the patient based on review of the above and I have provided the patient with a written personalized care plan for preventive services.    Physical Exam BP 138/82    Pulse 83    Resp 16    Ht 5\' 3"  (1.6 m)    Wt 181 lb (82.1 kg)    SpO2 94%    BMI 32.06 kg/m   EKG: non specific t wave abn, has multiple cardiac risk factors and has never had echo, or cardiology eval, refer cardiology  Assessment & Plan:  At risk for acute ischemic cardiac event Personal h/o HTN, diabetes, hyperlipidemia, obesity, and reduce physical activity, mother has cAD at advanced age also, refer to Cardiology  Non specific t wave abn on EKG today

## 2021-07-02 LAB — CMP14+EGFR
ALT: 17 IU/L (ref 0–32)
AST: 20 IU/L (ref 0–40)
Albumin/Globulin Ratio: 1.8 (ref 1.2–2.2)
Albumin: 4.6 g/dL (ref 3.8–4.8)
Alkaline Phosphatase: 110 IU/L (ref 44–121)
BUN/Creatinine Ratio: 14 (ref 12–28)
BUN: 11 mg/dL (ref 8–27)
Bilirubin Total: 0.2 mg/dL (ref 0.0–1.2)
CO2: 27 mmol/L (ref 20–29)
Calcium: 10.1 mg/dL (ref 8.7–10.3)
Chloride: 98 mmol/L (ref 96–106)
Creatinine, Ser: 0.81 mg/dL (ref 0.57–1.00)
Globulin, Total: 2.6 g/dL (ref 1.5–4.5)
Glucose: 120 mg/dL — ABNORMAL HIGH (ref 70–99)
Potassium: 4.1 mmol/L (ref 3.5–5.2)
Sodium: 144 mmol/L (ref 134–144)
Total Protein: 7.2 g/dL (ref 6.0–8.5)
eGFR: 83 mL/min/{1.73_m2} (ref >59)

## 2021-07-02 LAB — CBC WITH DIFFERENTIAL
Basophils Absolute: 0.1 10*3/uL (ref 0.0–0.2)
Basos: 1 %
EOS (ABSOLUTE): 0.4 10*3/uL (ref 0.0–0.4)
Eos: 5 %
Hematocrit: 37.1 % (ref 34.0–46.6)
Hemoglobin: 12.1 g/dL (ref 11.1–15.9)
Immature Grans (Abs): 0 10*3/uL (ref 0.0–0.1)
Immature Granulocytes: 0 %
Lymphocytes Absolute: 2.3 10*3/uL (ref 0.7–3.1)
Lymphs: 23 %
MCH: 22.6 pg — ABNORMAL LOW (ref 26.6–33.0)
MCHC: 32.6 g/dL (ref 31.5–35.7)
MCV: 69 fL — ABNORMAL LOW (ref 79–97)
Monocytes Absolute: 0.7 10*3/uL (ref 0.1–0.9)
Monocytes: 7 %
Neutrophils Absolute: 6.2 10*3/uL (ref 1.4–7.0)
Neutrophils: 64 %
RBC: 5.36 x10E6/uL — ABNORMAL HIGH (ref 3.77–5.28)
RDW: 14.9 % (ref 11.7–15.4)
WBC: 9.7 10*3/uL (ref 3.4–10.8)

## 2021-07-02 LAB — LIPID PANEL
Chol/HDL Ratio: 1.9 ratio (ref 0.0–4.4)
Cholesterol, Total: 151 mg/dL (ref 100–199)
HDL: 78 mg/dL (ref >39)
LDL Chol Calc (NIH): 57 mg/dL (ref 0–99)
Triglycerides: 86 mg/dL (ref 0–149)
VLDL Cholesterol Cal: 16 mg/dL (ref 5–40)

## 2021-07-02 LAB — HEMOGLOBIN A1C
Est. average glucose Bld gHb Est-mCnc: 163 mg/dL
Hgb A1c MFr Bld: 7.3 % — ABNORMAL HIGH (ref 4.8–5.6)

## 2021-07-02 LAB — SPECIMEN STATUS REPORT

## 2021-07-02 LAB — VITAMIN D 25 HYDROXY (VIT D DEFICIENCY, FRACTURES): Vit D, 25-Hydroxy: 31.4 ng/mL (ref 30.0–100.0)

## 2021-07-02 LAB — MICROALBUMIN, URINE: Microalbumin, Urine: <3 ug/mL

## 2021-07-02 LAB — TSH: TSH: 1.21 u[IU]/mL (ref 0.450–4.500)

## 2021-07-04 ENCOUNTER — Encounter: Payer: Self-pay | Admitting: Family Medicine

## 2021-07-04 DIAGNOSIS — R9431 Abnormal electrocardiogram [ECG] [EKG]: Secondary | ICD-10-CM | POA: Insufficient documentation

## 2021-07-04 DIAGNOSIS — Z9189 Other specified personal risk factors, not elsewhere classified: Secondary | ICD-10-CM | POA: Insufficient documentation

## 2021-07-04 NOTE — Assessment & Plan Note (Signed)
Personal h/o HTN, diabetes, hyperlipidemia, obesity, and reduce physical activity, mother has cAD at advanced age also, refer to Cardiology  Non specific t wave abn on EKG today

## 2021-07-04 NOTE — Assessment & Plan Note (Signed)
Welcome to medicare  exam as documented. Counseling done  re healthy lifestyle involving commitment to 150 minutes exercise per week, heart healthy diet, and attaining healthy weight.The importance of adequate sleep also discussed. Regular seat belt use and home safety, is also discussed. Changes in health habits are decided on by the patient with goals and time frames  set for achieving them. Immunization and cancer screening needs are specifically addressed at this visit. EKG in office is abnormal and she has multiple CV risk factore, refer crdiology

## 2021-07-05 ENCOUNTER — Other Ambulatory Visit: Payer: Self-pay

## 2021-07-05 ENCOUNTER — Ambulatory Visit (HOSPITAL_COMMUNITY)
Admission: RE | Admit: 2021-07-05 | Discharge: 2021-07-05 | Disposition: A | Discharge status: Home / Self Care | Payer: Medicare HMO | Source: Ambulatory Visit | Attending: Family Medicine | Admitting: Family Medicine

## 2021-07-05 DIAGNOSIS — Z1231 Encounter for screening mammogram for malignant neoplasm of breast: Secondary | ICD-10-CM | POA: Insufficient documentation

## 2021-07-12 ENCOUNTER — Other Ambulatory Visit: Payer: Self-pay | Admitting: Family Medicine

## 2021-07-27 ENCOUNTER — Other Ambulatory Visit: Payer: Self-pay

## 2021-07-27 ENCOUNTER — Encounter: Payer: Self-pay | Admitting: Family Medicine

## 2021-07-27 ENCOUNTER — Ambulatory Visit (INDEPENDENT_AMBULATORY_CARE_PROVIDER_SITE_OTHER): Payer: Medicare HMO | Admitting: Family Medicine

## 2021-07-27 DIAGNOSIS — E1159 Type 2 diabetes mellitus with other circulatory complications: Secondary | ICD-10-CM

## 2021-07-27 DIAGNOSIS — J45901 Unspecified asthma with (acute) exacerbation: Secondary | ICD-10-CM | POA: Insufficient documentation

## 2021-07-27 DIAGNOSIS — J4541 Moderate persistent asthma with (acute) exacerbation: Secondary | ICD-10-CM | POA: Diagnosis not present

## 2021-07-27 MED ORDER — BUDESONIDE-FORMOTEROL FUMARATE 80-4.5 MCG/ACT IN AERO: 2.0000 | INHALATION_SPRAY | Freq: Two times a day (BID) | RESPIRATORY_TRACT | 3 refills | Status: DC

## 2021-07-27 MED ORDER — PREDNISONE 5 MG (21) PO TBPK: 5.0000 mg | ORAL_TABLET | ORAL | 0 refills | Status: DC

## 2021-07-27 NOTE — Progress Notes (Signed)
Virtual Visit via Telephone Note  I connected with Daisy Robbins on 07/27/21 at  9:00 AM EST by telephone and verified that I am speaking with the correct person using two identifiers.  Location: Patient: daughter's home  Provider: office   I discussed the limitations, risks, security and privacy concerns of performing an evaluation and management service by telephone and the availability of in person appointments. I also discussed with the patient that there may be a patient responsible charge related to this service. The patient expressed understanding and agreed to proceed.   History of Present Illness: 1 week h/o increase wheezing, and coughing, non productive, mainly at night  in supine position and early morning Using rescue 2 to 3 times daily for 1 week No fever, chills, no sputum production Uncontrolleed diabetes is also addressed Denies polyuria, polydipsia, blurred vision , or hypoglycemic episodes.    Observations/Objective: There were no vitals taken for this visit. Good communication with no confusion and intact memory. Alert and oriented x 3. Dry cough during speech Assessment and Plan: Acute asthma flare Prednisone dose pack prescribed and pt to start daily maintainance inhaler  Type 2 diabetes mellitus with vascular disease ([place]) Daisy Robbins is reminded of the importance of commitment to daily physical activity for 30 minutes or more, as able and the need to limit carbohydrate intake to 30 to 60 grams per meal to help with blood sugar control.   The need to take medication as prescribed, test blood sugar as directed, and to call between visits if there is a concern that blood sugar is uncontrolled is also discussed.   Daisy Robbins is reminded of the importance of daily foot exam, annual eye examination, and good blood sugar, blood pressure and cholesterol control. Uncontrolled, reports improvement with compliance with diet and meds Updated lab needed at/ before next  visit.   Diabetic Labs Latest Ref Rng & Units 06/30/2021 09/18/2020 12/02/2019 12/02/2019 12/02/2019  HbA1c 4.8 - 5.6 % 7.3(H) 7.0(H) 6.7(A) 6.7 6.7  Microalbumin mg/dL - - - - -  Micro/Creat Ratio <30 mcg/mg creat - - - - -  Chol 100 - 199 mg/dL 151 177 200(H) - -  HDL >39 mg/dL 78 76 90 - -  Calc LDL 0 - 99 mg/dL 57 89 96 - -  Triglycerides 0 - 149 mg/dL 86 62 81 - -  Creatinine 0.57 - 1.00 mg/dL 0.81 0.95 0.80 - -   BP/Weight 06/30/2021 03/03/2021 10/12/2020 08/17/2020 01/29/2020 12/02/2019 34/19/3790  Systolic BP 240 973 - - 532 992 426  Diastolic BP 82 82 - - 76 76 76  Wt. (Lbs) 181 172 165 174 174 174.12 167.08  BMI 32.06 30.47 29.23 31.83 30.82 31.34 28.68   Foot/eye exam completion dates Latest Ref Rng & Units 07/22/2020 12/02/2019  Eye Exam No Retinopathy No Retinopathy -  Foot Form Completion - - Done          Follow Up Instructions:    I discussed the assessment and treatment plan with the patient. The patient was provided an opportunity to ask questions and all were answered. The patient agreed with the plan and demonstrated an understanding of the instructions.   The patient was advised to call back or seek an in-person evaluation if the symptoms worsen or if the condition fails to improve as anticipated.  I provided 12 minutes of non-face-to-face time during this encounter.   Tula Nakayama, MD

## 2021-07-27 NOTE — Patient Instructions (Addendum)
F/u as before, call if you need me sooner  You are treated for asthma flare with prednisone dose pack , and you need to start a daily maintainace inhaler also  It is important that you exercise regularly at least 30 minutes 5 times a week. If you develop chest pain, have severe difficulty breathing, or feel very tired, stop exercising immediately and seek medical attention   Think about what you will eat, plan ahead. Choose " clean, green, fresh or frozen" over canned, processed or packaged foods which are more sugary, salty and fatty. 70 to 75% of food eaten should be vegetables and fruit. Three meals at set times with snacks allowed between meals, but they must be fruit or vegetables. Aim to eat over a 12 hour period , example 7 am to 7 pm, and STOP after  your last meal of the day. Drink water,generally about 64 ounces per day, no other drink is as healthy. Fruit juice is best enjoyed in a healthy way, by EATING the fruit. ' Thanks for choosing [facility], we consider it a privelige to serve you.

## 2021-08-02 ENCOUNTER — Encounter: Payer: Self-pay | Admitting: Family Medicine

## 2021-08-02 NOTE — Assessment & Plan Note (Signed)
Prednisone dose pack prescribed and pt to start daily maintainance inhaler

## 2021-08-02 NOTE — Assessment & Plan Note (Signed)
Daisy Robbins is reminded of the importance of commitment to daily physical activity for 30 minutes or more, as able and the need to limit carbohydrate intake to 30 to 60 grams per meal to help with blood sugar control.   The need to take medication as prescribed, test blood sugar as directed, and to call between visits if there is a concern that blood sugar is uncontrolled is also discussed.   Daisy Robbins is reminded of the importance of daily foot exam, annual eye examination, and good blood sugar, blood pressure and cholesterol control. Uncontrolled, reports improvement with compliance with diet and meds Updated lab needed at/ before next visit.   Diabetic Labs Latest Ref Rng & Units 06/30/2021 09/18/2020 12/02/2019 12/02/2019 12/02/2019  HbA1c 4.8 - 5.6 % 7.3(H) 7.0(H) 6.7(A) 6.7 6.7  Microalbumin mg/dL - - - - -  Micro/Creat Ratio <30 mcg/mg creat - - - - -  Chol 100 - 199 mg/dL 151 177 200(H) - -  HDL >39 mg/dL 78 76 90 - -  Calc LDL 0 - 99 mg/dL 57 89 96 - -  Triglycerides 0 - 149 mg/dL 86 62 81 - -  Creatinine 0.57 - 1.00 mg/dL 0.81 0.95 0.80 - -   BP/Weight 06/30/2021 03/03/2021 10/12/2020 08/17/2020 01/29/2020 12/02/2019 37/03/6268  Systolic BP 485 462 - - 703 500 938  Diastolic BP 82 82 - - 76 76 76  Wt. (Lbs) 181 172 165 174 174 174.12 167.08  BMI 32.06 30.47 29.23 31.83 30.82 31.34 28.68   Foot/eye exam completion dates Latest Ref Rng & Units 07/22/2020 12/02/2019  Eye Exam No Retinopathy No Retinopathy -  Foot Form Completion - - Done

## 2021-08-03 NOTE — Progress Notes (Signed)
CARDIOLOGY CONSULT NOTE  ? ? ? ? ? ?Patient ID: ?Daisy Robbins ?MRN: [id number] ?DOB/AGE: Nov 28, 1960 61 y.o. ? ?Admit date: (Not on file) ?Referring Physician: Moshe Cipro ?Primary Physician: Fayrene Helper, MD ?Primary Cardiologist: New ?Reason for Consultation: Abnormal ECG ? ?Active Problems: ?  * No active hospital problems. * ? ? ?HPI:  60 y.o. referred by Dr Moshe Cipro for abnormal ECG History of HTN, DM-2, HLD and asthma.  Diabetes poorly controlled with A1c 7.3 LDL at goal 41 Mom with CAD And had CABG ?She is not active. Married mother of 4 Non smoker/ETOH Disabled at age 61 due to accident at work.  Injured right shoulder Worked at [employer] for 40 years ECG in primary office 06/30/21 SR nonspecific ST changes ? ?She has some exertional dyspnea No chest pain. Has gained weight Spends time with her grand kids Has two daughters locally and a son in [place].  ? ?Despite poorly controlled DM she says her diet is good Wants to join the [facility] soon and be more active  ? ? ?ROS ?All other systems reviewed and negative except as noted above ? ?Past Medical History:  ?Diagnosis Date  ? Allergy   ? Diabetes mellitus   ? Hypertension   ?  ?Family History  ?Problem Relation Age of Onset  ? Hypertension Mother   ? Cancer Father   ?     colon   ? Hypertension Sister   ? Diabetes Sister   ? Hypertension Sister   ? Hypertension Brother   ? Asthma Brother   ? Colon cancer Neg Hx   ?  ?Social History  ? ?Socioeconomic History  ? Marital status: Married  ?  Spouse name: Not on file  ? Number of children: Not on file  ? Years of education: Not on file  ? Highest education level: Not on file  ?Occupational History  ? Not on file  ?Tobacco Use  ? Smoking status: Never  ? Smokeless tobacco: Never  ?Vaping Use  ? Vaping Use: Never used  ?Substance and Sexual Activity  ? Alcohol use: No  ? Drug use: No  ? Sexual activity: Not on file  ?Other Topics Concern  ? Not on file  ?Social History Narrative  ? Not on file  ? ?Social Determinants of Health   ? ?Financial Resource Strain: Not on file  ?Food Insecurity: Not on file  ?Transportation Needs: Not on file  ?Physical Activity: Not on file  ?Stress: Not on file  ?Social Connections: Not on file  ?Intimate Partner Violence: Not on file  ?  ?Past Surgical History:  ?Procedure Laterality Date  ? ABDOMINAL HYSTERECTOMY    ? CESAREAN SECTION    ? COLONOSCOPY  03/01/2012  ? Procedure: COLONOSCOPY;  Surgeon: Rogene Houston, MD;  [facility];  Service: Endoscopy;  Laterality: N/A;  950  ? right carpal tunnel release    ?  ? ? ?Current Outpatient Medications:  ?  albuterol (VENTOLIN HFA) 108 (90 Base) MCG/ACT inhaler, Inhale 2 puffs into the lungs every 6 (six) hours as needed for wheezing or shortness of breath., Disp: 18 g, Rfl: 3 ?  benazepril-hydrochlorthiazide (LOTENSIN HCT) 20-12.5 MG tablet, TAKE 1 AND A HALF TABLETS BY MOUTH EVERY DAY, Disp: 135 tablet, Rfl: 3 ?  budesonide-formoterol (SYMBICORT) 80-4.5 MCG/ACT inhaler, Inhale 2 puffs into the lungs 2 (two) times daily., Disp: 1 each, Rfl: 3 ?  JANUMET XR 50-1000 MG TB24, TAKE 2 TABLETS BY MOUTH EVERY DAY (HIGH  COST HOLD), Disp: 180 tablet, Rfl: 0 ?  latanoprost (XALATAN) 0.005 % ophthalmic solution, 1 drop at bedtime., Disp: , Rfl:  ?  loratadine (CLARITIN) 10 MG tablet, TAKE 1 TABLET BY MOUTH EVERY DAY, Disp: 90 tablet, Rfl: 1 ?  montelukast (SINGULAIR) 10 MG tablet, TAKE 1 TABLET BY MOUTH EVERYDAY AT BEDTIME, Disp: 90 tablet, Rfl: 1 ?  rosuvastatin (CRESTOR) 5 MG tablet, TAKE ONE TABLET BY MOUTH EVERY MONDAY AND THURSDAY, Disp: 24 tablet, Rfl: 5 ? ? ? ?Physical Exam: ?Blood pressure 130/86, pulse 84, height 5\' 3"  (1.6 m), weight 184 lb (83.5 kg), SpO2 99 %.   ? ? ?Labs: ?  ?Lab Results  ?Component Value Date  ? WBC 9.7 06/30/2021  ? HGB 12.1 06/30/2021  ? HCT 37.1 06/30/2021  ? MCV 69 (L) 06/30/2021  ? PLT 283 12/02/2019  ? No results for input(s): NA, K, CL, CO2, BUN, CREATININE, CALCIUM, PROT, BILITOT, ALKPHOS, ALT, AST, GLUCOSE in the last 168  hours. ? ?Invalid input(s): LABALBU ?No results found for: CKTOTAL, CKMB, CKMBINDEX, TROPONINI  ?Lab Results  ?Component Value Date  ? CHOL 151 06/30/2021  ? CHOL 177 09/18/2020  ? CHOL 200 (H) 12/02/2019  ? ?Lab Results  ?Component Value Date  ? HDL 78 06/30/2021  ? HDL 76 09/18/2020  ? HDL 90 12/02/2019  ? ?Lab Results  ?Component Value Date  ? [place] 57 06/30/2021  ? [place] 89 09/18/2020  ? [place] 96 12/02/2019  ? ?Lab Results  ?Component Value Date  ? TRIG 86 06/30/2021  ? TRIG 62 09/18/2020  ? TRIG 81 12/02/2019  ? ?Lab Results  ?Component Value Date  ? CHOLHDL 1.9 06/30/2021  ? CHOLHDL 2.3 09/18/2020  ? CHOLHDL 2.2 12/02/2019  ? ?No results found for: LDLDIRECT  ?  ?Radiology: ?No results found. ? ?EKG: See HPI ? ? ?ASSESSMENT AND PLAN:  ? ?CAD:  risk with DM, HTN, HLD, family history and nonspecific ST changes on ECG Shared decision making she does not want to go to [facility] for CT[facility]. Will order exercise myovue given nonspecific ECG changes exertional dyspnea r/o anginal equivalent in diabetic  ?DM: Discussed low carb diet.  Target hemoglobin A1c is 6.5 or less.  Continue current medications. ?HTN:  Well controlled.  Continue current medications and low sodium Dash type diet.   ?HLD:  continue statin LdL at goal  ? ? ?Ex Myovue ? ?F/U cardiology PRN  ?Signed: ?Jenkins Rouge ?08/09/2021, 10:28 AM ? ? ?

## 2021-08-09 ENCOUNTER — Other Ambulatory Visit: Payer: Self-pay

## 2021-08-09 ENCOUNTER — Encounter: Payer: Self-pay | Admitting: Cardiovascular Disease

## 2021-08-09 ENCOUNTER — Ambulatory Visit: Payer: Medicare HMO | Admitting: Cardiovascular Disease

## 2021-08-09 VITALS — BP 130/86 | HR 84 | Ht 63.0 in | Wt 184.0 lb

## 2021-08-09 DIAGNOSIS — I10 Essential (primary) hypertension: Secondary | ICD-10-CM | POA: Diagnosis not present

## 2021-08-09 DIAGNOSIS — E1159 Type 2 diabetes mellitus with other circulatory complications: Secondary | ICD-10-CM | POA: Diagnosis not present

## 2021-08-09 DIAGNOSIS — R9431 Abnormal electrocardiogram [ECG] [EKG]: Secondary | ICD-10-CM | POA: Diagnosis not present

## 2021-08-09 DIAGNOSIS — Z9189 Other specified personal risk factors, not elsewhere classified: Secondary | ICD-10-CM | POA: Diagnosis not present

## 2021-08-09 DIAGNOSIS — R0609 Other forms of dyspnea: Secondary | ICD-10-CM

## 2021-08-09 NOTE — Patient Instructions (Signed)
Testing/Procedures: ?Your physician has requested that you have a exercise myoview. For further information please visit [email or web address]. Please follow instruction sheet, as given. ? ? ?Follow-Up: ?Follow up with Dr. Johnsie Cancel as needed.  ? ?Any Other Special Instructions Will Be Listed Below (If Applicable). ? ? ? ? ?If you need a refill on your cardiac medications before your next appointment, please call your pharmacy. ? ?

## 2021-08-12 ENCOUNTER — Other Ambulatory Visit: Payer: Self-pay | Admitting: Family Medicine

## 2021-08-23 ENCOUNTER — Encounter (HOSPITAL_COMMUNITY): Payer: Medicare HMO

## 2021-08-23 ENCOUNTER — Ambulatory Visit (HOSPITAL_COMMUNITY): Payer: Medicare HMO

## 2021-09-07 ENCOUNTER — Encounter (HOSPITAL_COMMUNITY)
Admission: RE | Admit: 2021-09-07 | Discharge: 2021-09-07 | Disposition: A | Discharge status: Home / Self Care | Payer: Medicare HMO | Source: Ambulatory Visit | Attending: Cardiovascular Disease | Admitting: Cardiovascular Disease

## 2021-09-07 ENCOUNTER — Ambulatory Visit (HOSPITAL_COMMUNITY)
Admission: RE | Admit: 2021-09-07 | Discharge: 2021-09-07 | Disposition: A | Discharge status: Home / Self Care | Payer: Medicare HMO | Source: Ambulatory Visit | Attending: Cardiovascular Disease | Admitting: Cardiovascular Disease

## 2021-09-07 ENCOUNTER — Encounter (HOSPITAL_COMMUNITY): Payer: Self-pay

## 2021-09-07 DIAGNOSIS — R9431 Abnormal electrocardiogram [ECG] [EKG]: Secondary | ICD-10-CM | POA: Diagnosis not present

## 2021-09-07 LAB — NM MYOCAR MULTI W/SPECT W/WALL MOTION / EF
Angina Index: 0
Duke Treadmill Score: 3
Estimated workload: 4.6
Exercise duration (min): 3 min
Exercise duration (sec): 0 s
LV dias vol: 48 mL (ref 46–106)
LV sys vol: 13 mL
MPHR: 158 {beats}/min
Nuc Stress EF: 74 %
Peak HR: 166 {beats}/min
Percent HR: 105 %
RATE: 0.3
RPE: 15
Rest HR: 71 {beats}/min
Rest Nuclear Isotope Dose: 10.4 mCi
SDS: 4
SRS: 0
SSS: 4
ST Depression (mm): 0 mm
Stress Nuclear Isotope Dose: 29.1 mCi
TID: 1.24

## 2021-09-07 MED ORDER — TECHNETIUM TC 99M TETROFOSMIN IV KIT
10.0000 | PACK | Freq: Once | INTRAVENOUS | Status: AC | PRN
  Administered 2021-09-07: 10.4 via INTRAVENOUS

## 2021-09-07 MED ORDER — REGADENOSON 0.4 MG/5ML IV SOLN
INTRAVENOUS | Status: AC
  Filled 2021-09-07: qty 5

## 2021-09-07 MED ORDER — TECHNETIUM TC 99M TETROFOSMIN IV KIT
30.0000 | PACK | Freq: Once | INTRAVENOUS | Status: AC | PRN
  Administered 2021-09-07: 29.1 via INTRAVENOUS

## 2021-09-07 MED ORDER — SODIUM CHLORIDE FLUSH 0.9 % IV SOLN
INTRAVENOUS | Status: AC
  Administered 2021-09-07: 10 mL via INTRAVENOUS
  Filled 2021-09-07: qty 10

## 2021-09-24 ENCOUNTER — Telehealth: Payer: Self-pay

## 2021-09-24 NOTE — Telephone Encounter (Signed)
Patient spouse returning call does not know if it was for him or his wife Daisy Robbins. Please return Daisy Robbins call at [phone] ?

## 2021-09-29 NOTE — Telephone Encounter (Signed)
I did not call pt nor see a note in the chart where anyone else called her either  ?

## 2021-10-06 ENCOUNTER — Other Ambulatory Visit: Payer: Self-pay | Admitting: Family Medicine

## 2021-10-21 DIAGNOSIS — H401131 Primary open-angle glaucoma, bilateral, mild stage: Secondary | ICD-10-CM | POA: Diagnosis not present

## 2021-10-21 DIAGNOSIS — H524 Presbyopia: Secondary | ICD-10-CM | POA: Diagnosis not present

## 2021-10-21 DIAGNOSIS — Z0100 Encounter for examination of eyes and vision without abnormal findings: Secondary | ICD-10-CM | POA: Diagnosis not present

## 2021-10-28 ENCOUNTER — Ambulatory Visit: Payer: Medicare HMO | Admitting: Family Medicine

## 2021-11-08 ENCOUNTER — Other Ambulatory Visit: Payer: Self-pay | Admitting: Family Medicine

## 2021-11-19 ENCOUNTER — Ambulatory Visit: Payer: Medicare HMO | Admitting: Family Medicine

## 2021-11-23 ENCOUNTER — Ambulatory Visit: Payer: Medicare HMO | Admitting: Family Medicine

## 2021-11-25 ENCOUNTER — Encounter: Payer: Self-pay | Admitting: Family Medicine

## 2021-11-25 ENCOUNTER — Ambulatory Visit (INDEPENDENT_AMBULATORY_CARE_PROVIDER_SITE_OTHER): Payer: Medicare HMO | Admitting: Family Medicine

## 2021-11-25 VITALS — BP 143/82 | HR 90 | Ht 63.0 in | Wt 179.1 lb

## 2021-11-25 DIAGNOSIS — E1159 Type 2 diabetes mellitus with other circulatory complications: Secondary | ICD-10-CM | POA: Diagnosis not present

## 2021-11-25 DIAGNOSIS — J4540 Moderate persistent asthma, uncomplicated: Secondary | ICD-10-CM | POA: Diagnosis not present

## 2021-11-25 DIAGNOSIS — E669 Obesity, unspecified: Secondary | ICD-10-CM

## 2021-11-25 DIAGNOSIS — E785 Hyperlipidemia, unspecified: Secondary | ICD-10-CM | POA: Diagnosis not present

## 2021-11-25 DIAGNOSIS — I10 Essential (primary) hypertension: Secondary | ICD-10-CM | POA: Diagnosis not present

## 2021-11-25 LAB — POCT GLYCOSYLATED HEMOGLOBIN (HGB A1C): HbA1c, POC (controlled diabetic range): 6.8 % (ref 0.0–7.0)

## 2021-11-25 MED ORDER — BENAZEPRIL-HYDROCHLOROTHIAZIDE 20-12.5 MG PO TABS: ORAL_TABLET | ORAL | 2 refills | Status: DC

## 2021-11-25 NOTE — Patient Instructions (Addendum)
Annual exam in 4 months, call if you need me sooner  Increase benazepril/hctz to TWO tablets once daily, bP is still too high  CONGRATS on weight loss and improved blood sugar  Fasting lipid, cmp andeGFr, hBA1C  5 to 7 days before next visit  It is important that you exercise regularly at least 30 minutes 5 times a week. If you develop chest pain, have severe difficulty breathing, or feel very tired, stop exercising immediately and seek medical attention   Think about what you will eat, plan ahead. Choose " clean, green, fresh or frozen" over canned, processed or packaged foods which are more sugary, salty and fatty. 70 to 75% of food eaten should be vegetables and fruit. Three meals at set times with snacks allowed between meals, but they must be fruit or vegetables. Aim to eat over a 12 hour period , example 7 am to 7 pm, and STOP after  your last meal of the day. Drink water,generally about 64 ounces per day, no other drink is as healthy. Fruit juice is best enjoyed in a healthy way, by EATING the fruit.  Pls get shingrix  vaccines and TdAP  Thanks for choosing [facility], we consider it a privelige to serve you.

## 2021-11-28 ENCOUNTER — Encounter: Payer: Self-pay | Admitting: Family Medicine

## 2021-11-28 DIAGNOSIS — J4550 Severe persistent asthma, uncomplicated: Secondary | ICD-10-CM | POA: Insufficient documentation

## 2021-11-28 DIAGNOSIS — J45909 Unspecified asthma, uncomplicated: Secondary | ICD-10-CM | POA: Insufficient documentation

## 2021-11-28 NOTE — Assessment & Plan Note (Signed)
Stable and controlled, no current or recent flare

## 2022-01-16 ENCOUNTER — Other Ambulatory Visit: Payer: Self-pay | Admitting: Family Medicine

## 2022-02-03 ENCOUNTER — Telehealth: Payer: Self-pay

## 2022-02-03 ENCOUNTER — Other Ambulatory Visit: Payer: Self-pay

## 2022-02-03 MED ORDER — PROAIR HFA 108 (90 BASE) MCG/ACT IN AERS: 2.0000 | INHALATION_SPRAY | Freq: Four times a day (QID) | RESPIRATORY_TRACT | 3 refills | Status: DC | PRN

## 2022-02-03 NOTE — Telephone Encounter (Signed)
Patient called said the inhaler cost too much needs a generic cost is too high (patient has [employer])  albuterol (VENTOLIN HFA) 108 (90 Base) MCG/ACT inhaler [[id number]   Pharmacy: [facility]

## 2022-02-03 NOTE — Telephone Encounter (Signed)
Sent proair to pharmacy

## 2022-02-09 ENCOUNTER — Other Ambulatory Visit: Payer: Self-pay | Admitting: Family Medicine

## 2022-02-17 ENCOUNTER — Other Ambulatory Visit: Payer: Self-pay | Admitting: Family Medicine

## 2022-02-25 IMAGING — MG DIGITAL SCREENING BILAT W/ TOMO W/ CAD
6 of 12 series · 6 of 36 positions shown · non-contrast
Comparison: Previous exam(s).

CLINICAL DATA: Screening.

EXAM:
DIGITAL SCREENING BILATERAL MAMMOGRAM WITH TOMO AND CAD

[L CC synth-2D (1 of 2)]
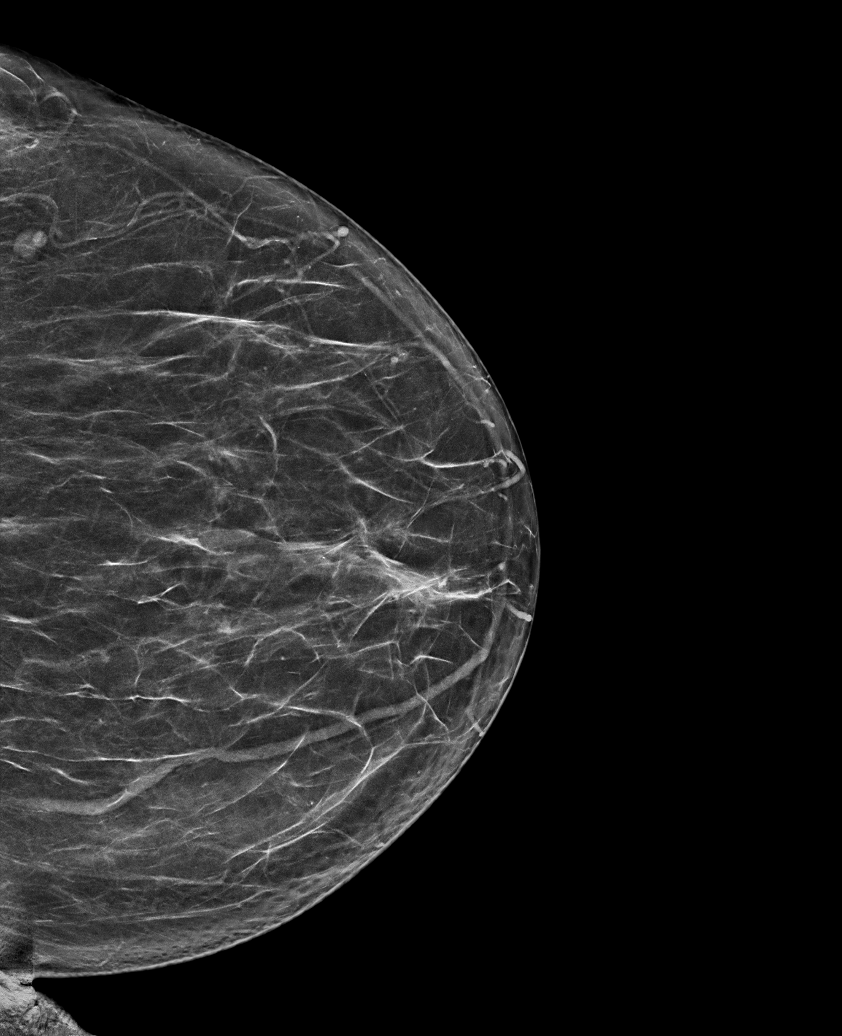

[R CC synth-2D (1 of 2)]
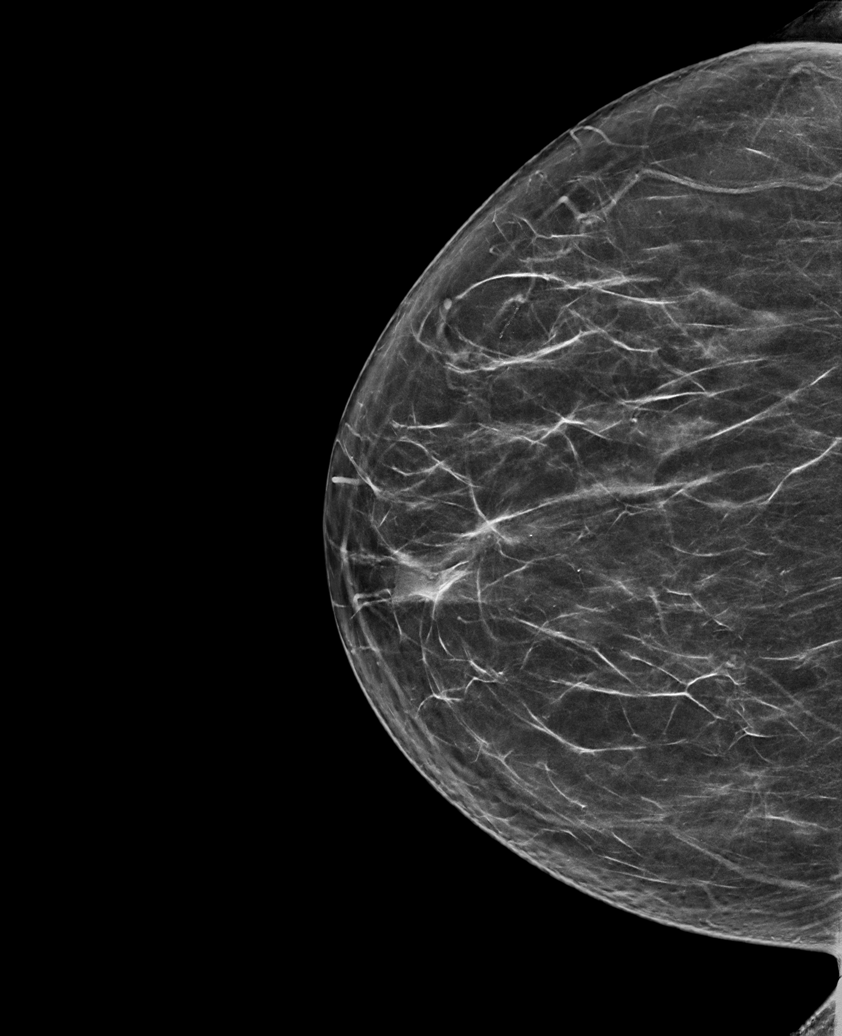

[L MLO synth-2D]
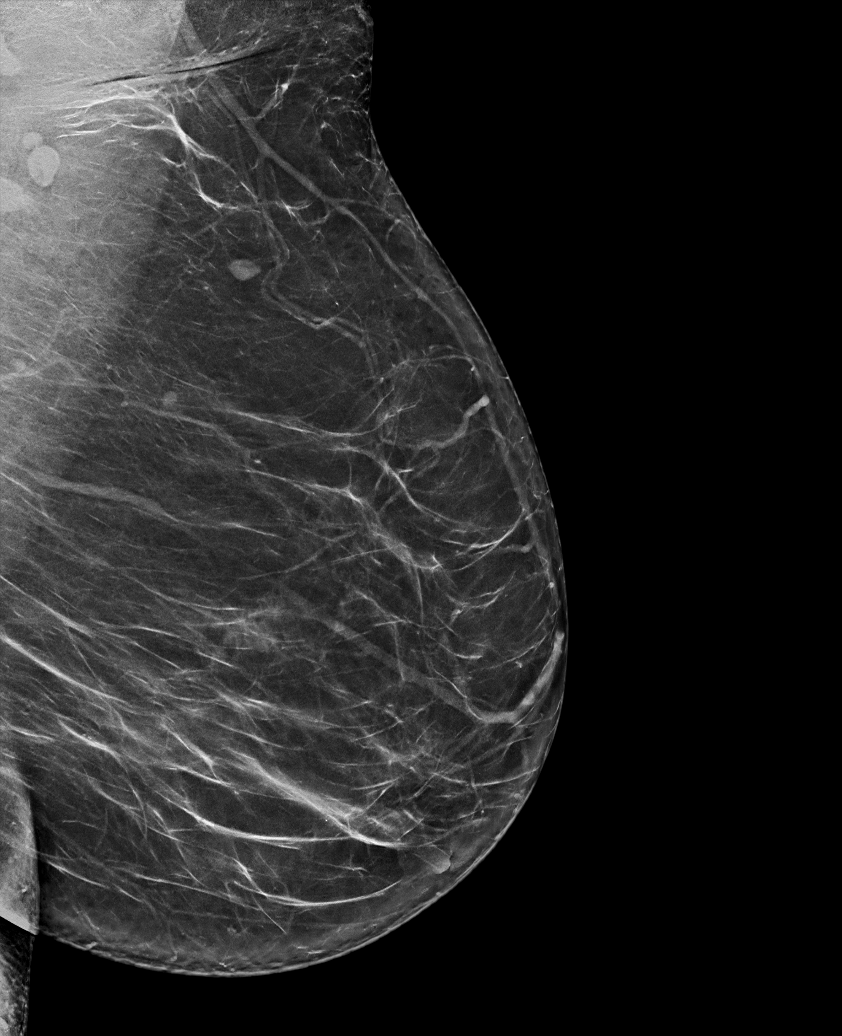

[R CC synth-2D (2 of 2)]
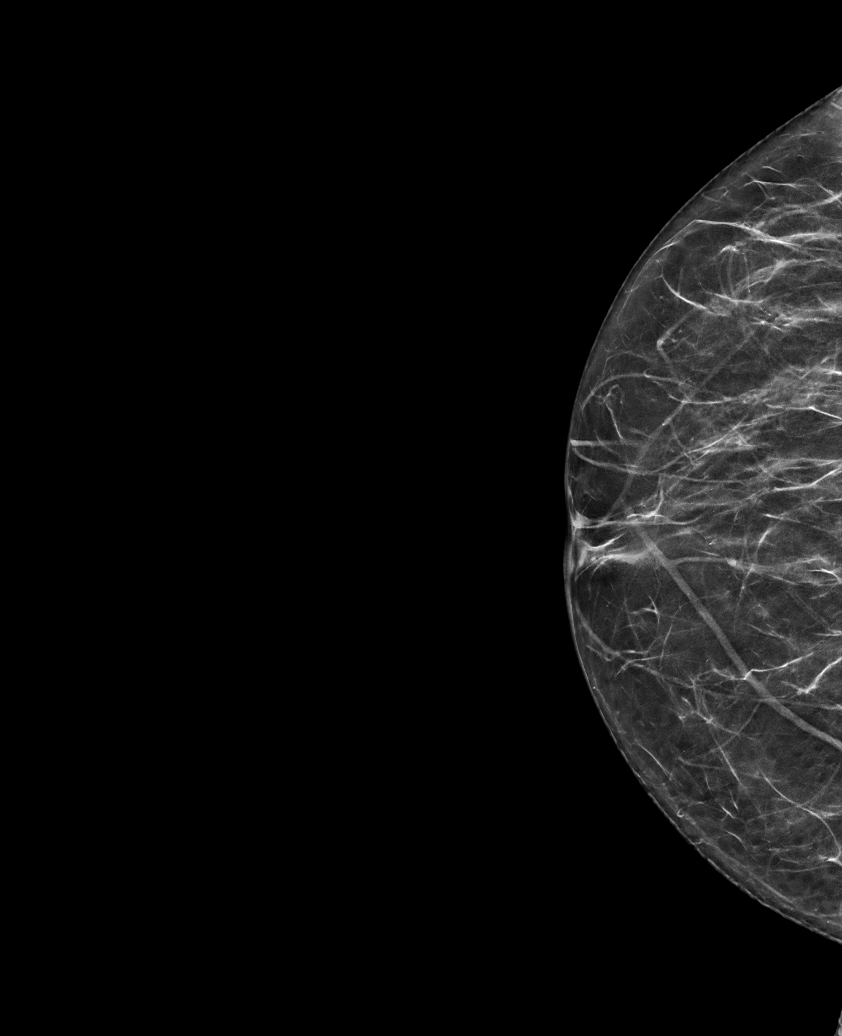

[R MLO synth-2D]
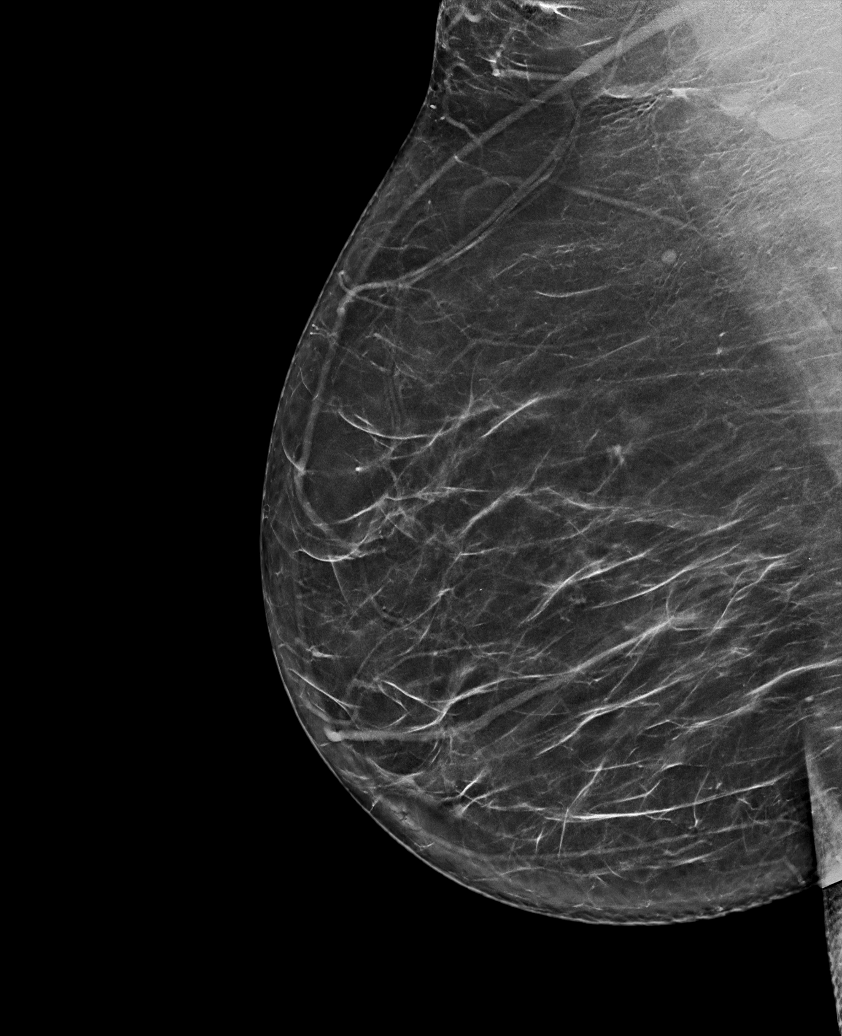

[L CC synth-2D (2 of 2)]
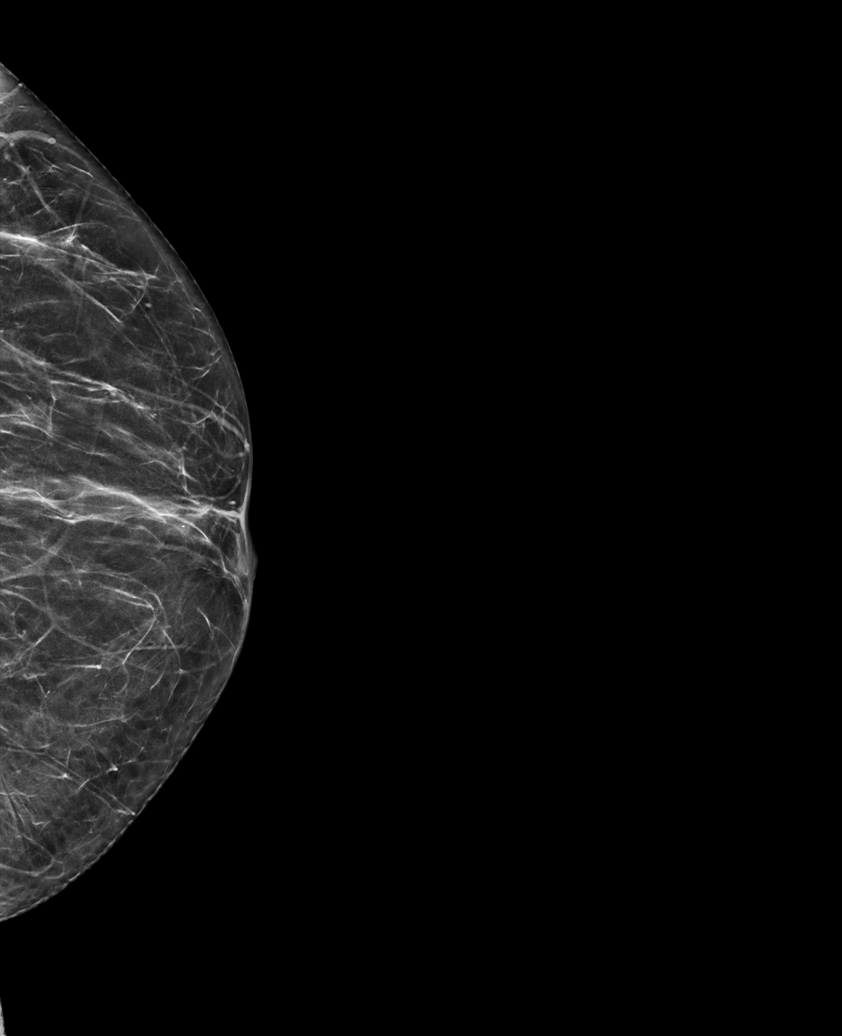

[6 of 36 positions shown; findings below may reference images not displayed]

ACR Breast Density Category b: There are scattered areas of
fibroglandular density.
FINDINGS: There are no findings suspicious for malignancy. Images were
processed with CAD.
IMPRESSION: No mammographic evidence of malignancy. A result letter of this
screening mammogram will be mailed directly to the patient.

RECOMMENDATION:
Screening mammogram in one year. (Code:[id number])

BI-RADS CATEGORY  1: Negative.

## 2022-02-28 ENCOUNTER — Encounter (INDEPENDENT_AMBULATORY_CARE_PROVIDER_SITE_OTHER): Payer: Self-pay | Admitting: *Deleted

## 2022-03-01 ENCOUNTER — Other Ambulatory Visit: Payer: Self-pay

## 2022-03-01 MED ORDER — BENAZEPRIL-HYDROCHLOROTHIAZIDE 20-12.5 MG PO TABS: ORAL_TABLET | ORAL | 2 refills | Status: DC

## 2022-03-01 MED ORDER — ROSUVASTATIN CALCIUM 5 MG PO TABS: ORAL_TABLET | ORAL | 5 refills | Status: DC

## 2022-03-01 MED ORDER — ALBUTEROL SULFATE HFA 108 (90 BASE) MCG/ACT IN AERS: 2.0000 | INHALATION_SPRAY | Freq: Four times a day (QID) | RESPIRATORY_TRACT | 3 refills | Status: DC | PRN

## 2022-03-01 MED ORDER — MONTELUKAST SODIUM 10 MG PO TABS: ORAL_TABLET | ORAL | 1 refills | Status: DC

## 2022-03-03 ENCOUNTER — Encounter: Payer: Medicare HMO | Admitting: Family Medicine

## 2022-03-14 DIAGNOSIS — H401131 Primary open-angle glaucoma, bilateral, mild stage: Secondary | ICD-10-CM | POA: Diagnosis not present

## 2022-03-14 DIAGNOSIS — Z0100 Encounter for examination of eyes and vision without abnormal findings: Secondary | ICD-10-CM | POA: Diagnosis not present

## 2022-03-14 DIAGNOSIS — H524 Presbyopia: Secondary | ICD-10-CM | POA: Diagnosis not present

## 2022-03-25 ENCOUNTER — Encounter: Payer: Medicare HMO | Admitting: Family Medicine

## 2022-04-11 ENCOUNTER — Ambulatory Visit (INDEPENDENT_AMBULATORY_CARE_PROVIDER_SITE_OTHER): Payer: Medicare HMO | Admitting: Internal Medicine

## 2022-04-11 ENCOUNTER — Encounter: Payer: Self-pay | Admitting: Internal Medicine

## 2022-04-11 DIAGNOSIS — J4541 Moderate persistent asthma with (acute) exacerbation: Secondary | ICD-10-CM

## 2022-04-11 DIAGNOSIS — E1159 Type 2 diabetes mellitus with other circulatory complications: Secondary | ICD-10-CM | POA: Diagnosis not present

## 2022-04-11 MED ORDER — METHYLPREDNISOLONE 4 MG PO TBPK: ORAL_TABLET | ORAL | 0 refills | Status: DC

## 2022-04-11 NOTE — Addendum Note (Signed)
Addended byIhor Dow on: 04/11/2022 09:48 AM   Modules accepted: Orders

## 2022-04-11 NOTE — Progress Notes (Signed)
Virtual Visit via Telephone Note   This visit type was conducted via telephone. This format is felt to be most appropriate for this patient at this time.  The patient did not have access to video technology/had technical difficulties with video requiring transitioning to audio format only (telephone).  All issues noted in this document were discussed and addressed.  No physical exam could be performed with this format.  Evaluation Performed:  Follow-up visit  Date:  04/11/2022   ID:  Daisy Robbins, DOB 04/15/1961, MRN [id number]  Patient Location: Home Provider Location: Office/Clinic  Participants: Patient Location of Patient: Home Location of Provider: Telehealth Consent was obtain for visit to be over via telehealth. I verified that I am speaking with the correct person using two identifiers.  PCP:  Fayrene Helper, MD   Chief Complaint: Cough and wheezing  History of Present Illness:    Daisy Robbins is a 61 y.o. female who has a televisit for complaint of cough and wheezing for the last 1 week.  She has history of asthma, and uses Symbicort regularly and has been using albuterol inhaler more frequently recently.  She has also tried Coricidin without much relief.  Denies any fever or chills.  Denies any nasal congestion, postnasal drip or sore throat.  The patient does not have symptoms concerning for COVID-19 infection (fever, chills, cough, or new shortness of breath).   Past Medical, Surgical, Social History, Allergies, and Medications have been Reviewed.  Past Medical History:  Diagnosis Date   Allergy    Diabetes mellitus    Hypertension    Past Surgical History:  Procedure Laterality Date   ABDOMINAL HYSTERECTOMY     CESAREAN SECTION     COLONOSCOPY  03/01/2012   Procedure: COLONOSCOPY;  Surgeon: Rogene Houston, MD;  [facility];  Service: Endoscopy;  Laterality: N/A;  950   right carpal tunnel release       Current Meds  Medication Sig    albuterol (VENTOLIN HFA) 108 (90 Base) MCG/ACT inhaler Inhale 2 puffs into the lungs every 6 (six) hours as needed for wheezing or shortness of breath.   benazepril-hydrochlorthiazide (LOTENSIN HCT) 20-12.5 MG tablet Take two tablets by mouht once daily for blood pressure   budesonide-formoterol (SYMBICORT) 80-4.5 MCG/ACT inhaler Inhale 2 puffs into the lungs 2 (two) times daily.   JANUMET XR 50-1000 MG TB24 TAKE 2 TABLETS BY MOUTH EVERY DAY (HIGH COST HOLD)   latanoprost (XALATAN) 0.005 % ophthalmic solution 1 drop at bedtime.   loratadine (CLARITIN) 10 MG tablet TAKE 1 TABLET BY MOUTH EVERY DAY   montelukast (SINGULAIR) 10 MG tablet TAKE 1 TABLET BY MOUTH EVERYDAY AT BEDTIME   PROAIR HFA 108 (90 Base) MCG/ACT inhaler Inhale 2 puffs into the lungs every 6 (six) hours as needed for wheezing or shortness of breath.   rosuvastatin (CRESTOR) 5 MG tablet TAKE ONE TABLET BY MOUTH EVERY MONDAY AND THURSDAY     Allergies:   Patient has no known allergies.   ROS:   Please see the history of present illness.     All other systems reviewed and are negative.   Labs/Other Tests and Data Reviewed:    Recent Labs: 06/30/2021: ALT 17; BUN 11; Creatinine, Ser 0.81; Hemoglobin 12.1; Potassium 4.1; Sodium 144; TSH 1.210   Recent Lipid Panel Lab Results  Component Value Date/Time   CHOL 151 06/30/2021 08:28 AM   TRIG 86 06/30/2021 08:28 AM   HDL 78 06/30/2021  08:28 AM   CHOLHDL 1.9 06/30/2021 08:28 AM   CHOLHDL 2.5 06/20/2018 08:43 AM   LDLCALC 57 06/30/2021 08:28 AM   LDLCALC 94 06/20/2018 08:43 AM    Wt Readings from Last 3 Encounters:  11/25/21 179 lb 1.9 oz (81.2 kg)  08/09/21 184 lb (83.5 kg)  06/30/21 181 lb (82.1 kg)     ASSESSMENT & PLAN:    Asthma exacerbation Has recent worsening of cough, dyspnea and wheezing Started Medrol Dosepak Continue Symbicort Albuterol as needed for dyspnea or wheezing Coricidin or Robitussin as needed for cough  Type II DM On Janumet Advised to  check blood glucose regularly since she is going to take steroid for asthma    Time:   Today, I have spent 11 minutes reviewing the chart, including problem list, medications, and with the patient with telehealth technology discussing the above problems.   Medication Adjustments/Labs and Tests Ordered: Current medicines are reviewed at length with the patient today.  Concerns regarding medicines are outlined above.   Tests Ordered: No orders of the defined types were placed in this encounter.   Medication Changes: No orders of the defined types were placed in this encounter.    Note: This dictation was prepared with Dragon dictation along with smaller phrase technology. Similar sounding words can be transcribed inadequately or may not be corrected upon review. Any transcriptional errors that result from this process are unintentional.      Disposition:  Follow up  Signed, Lindell Spar, MD  04/11/2022 9:44 AM     [place]

## 2022-04-11 NOTE — Patient Instructions (Signed)
Please check blood glucose regularly while taking steroids.

## 2022-05-03 ENCOUNTER — Telehealth: Payer: Self-pay | Admitting: Family Medicine

## 2022-05-03 NOTE — Telephone Encounter (Signed)
Dr Moshe Cipro has an opening tomorrow. Pls schedule

## 2022-05-03 NOTE — Telephone Encounter (Signed)
Appt scheduled pt aware 

## 2022-05-03 NOTE — Telephone Encounter (Signed)
Pt called stating she did a tele with other provider here and is not better. Has SOB, wheezing, has asthma. Unable to exercise or anything. States it is worse at bedtime. Wants to know if she can either get something stronger for this or be worked in? ONLY WANTS TO SEE DR. Moshe Cipro

## 2022-05-04 ENCOUNTER — Ambulatory Visit (HOSPITAL_COMMUNITY)
Admission: RE | Admit: 2022-05-04 | Discharge: 2022-05-04 | Disposition: A | Discharge status: Home / Self Care | Payer: Medicare HMO | Source: Ambulatory Visit | Attending: Family Medicine | Admitting: Family Medicine

## 2022-05-04 ENCOUNTER — Ambulatory Visit (INDEPENDENT_AMBULATORY_CARE_PROVIDER_SITE_OTHER): Payer: Medicare HMO | Admitting: Family Medicine

## 2022-05-04 ENCOUNTER — Encounter: Payer: Self-pay | Admitting: Family Medicine

## 2022-05-04 VITALS — BP 131/64 | HR 87 | Resp 16 | Ht 63.0 in | Wt 179.0 lb

## 2022-05-04 DIAGNOSIS — R051 Acute cough: Secondary | ICD-10-CM | POA: Insufficient documentation

## 2022-05-04 DIAGNOSIS — I10 Essential (primary) hypertension: Secondary | ICD-10-CM | POA: Diagnosis not present

## 2022-05-04 DIAGNOSIS — E1159 Type 2 diabetes mellitus with other circulatory complications: Secondary | ICD-10-CM | POA: Diagnosis not present

## 2022-05-04 DIAGNOSIS — R059 Cough, unspecified: Secondary | ICD-10-CM | POA: Diagnosis not present

## 2022-05-04 DIAGNOSIS — J4541 Moderate persistent asthma with (acute) exacerbation: Secondary | ICD-10-CM

## 2022-05-04 MED ORDER — IPRATROPIUM BROMIDE 0.02 % IN SOLN
0.5000 mg | Freq: Once | RESPIRATORY_TRACT | Status: AC
  Administered 2022-05-04: 0.5 mg via RESPIRATORY_TRACT

## 2022-05-04 MED ORDER — ALBUTEROL SULFATE (2.5 MG/3ML) 0.083% IN NEBU
2.5000 mg | INHALATION_SOLUTION | Freq: Once | RESPIRATORY_TRACT | Status: AC
  Administered 2022-05-04: 2.5 mg via RESPIRATORY_TRACT

## 2022-05-04 MED ORDER — PREDNISONE 10 MG (21) PO TBPK: ORAL_TABLET | ORAL | 0 refills | Status: DC

## 2022-05-04 MED ORDER — BENZONATATE 100 MG PO CAPS: 100.0000 mg | ORAL_CAPSULE | Freq: Two times a day (BID) | ORAL | 0 refills | Status: DC | PRN

## 2022-05-04 MED ORDER — AZITHROMYCIN 250 MG PO TABS: ORAL_TABLET | ORAL | 0 refills | Status: AC

## 2022-05-04 MED ORDER — GUAIFENESIN-CODEINE 100-10 MG/5ML PO SYRP: ORAL_SOLUTION | ORAL | 0 refills | Status: DC

## 2022-05-04 MED ORDER — METHYLPREDNISOLONE ACETATE 80 MG/ML IJ SUSP
80.0000 mg | Freq: Once | INTRAMUSCULAR | Status: AC
  Administered 2022-05-04: 80 mg via INTRAMUSCULAR

## 2022-05-04 NOTE — Assessment & Plan Note (Signed)
Current flare with worsening symptoms in past 3 weeks. Neb treatment , depo medrol I'm , short course of high dose prednisone and antibiotic, also cXR

## 2022-05-04 NOTE — Assessment & Plan Note (Signed)
Controlled, no change in medication DASH diet and commitment to daily physical activity for a minimum of 30 minutes discussed and encouraged, as a part of hypertension management. The importance of attaining a healthy weight is also discussed.     05/04/2022    8:26 AM 11/25/2021   11:25 AM 08/09/2021   10:17 AM 06/30/2021    3:50 PM 06/30/2021    2:54 PM 03/03/2021    3:13 PM 10/12/2020   11:08 AM  BP/Weight  Systolic BP 295 621 308 657 846 962   Diastolic BP 64 82 86 82 84 82   Wt. (Lbs) 179 179.12 184  181 172 165  BMI 31.71 kg/m2 31.73 kg/m2 32.59 kg/m2  32.06 kg/m2 30.47 kg/m2 29.23 kg/m2

## 2022-05-04 NOTE — Assessment & Plan Note (Signed)
Updated lab needed at/ before next visit. Daisy Robbins is reminded of the importance of commitment to daily physical activity for 30 minutes or more, as able and the need to limit carbohydrate intake to 30 to 60 grams per meal to help with blood sugar control.   The need to take medication as prescribed, test blood sugar as directed, and to call between visits if there is a concern that blood sugar is uncontrolled is also discussed.   Daisy Robbins is reminded of the importance of daily foot exam, annual eye examination, and good blood sugar, blood pressure and cholesterol control.     Latest Ref Rng & Units 11/25/2021   12:06 PM 06/30/2021    8:28 AM 09/18/2020    9:10 AM 12/02/2019    3:46 PM 12/02/2019    2:27 PM  Diabetic Labs  HbA1c 0.0 - 7.0 % 6.8  7.3  7.0   6.7    6.7    6.7    6.7   Chol 100 - 199 mg/dL  151  177  200    HDL >39 mg/dL  78  76  90    Calc LDL 0 - 99 mg/dL  57  89  96    Triglycerides 0 - 149 mg/dL  86  62  81    Creatinine 0.57 - 1.00 mg/dL  0.81  0.95  0.80        05/04/2022    8:26 AM 11/25/2021   11:25 AM 08/09/2021   10:17 AM 06/30/2021    3:50 PM 06/30/2021    2:54 PM 03/03/2021    3:13 PM 10/12/2020   11:08 AM  BP/Weight  Systolic BP 357 017 793 903 009 233   Diastolic BP 64 82 86 82 84 82   Wt. (Lbs) 179 179.12 184  181 172 165  BMI 31.71 kg/m2 31.73 kg/m2 32.59 kg/m2  32.06 kg/m2 30.47 kg/m2 29.23 kg/m2      Latest Ref Rng & Units 07/22/2020   12:00 AM 12/02/2019    2:00 PM  Foot/eye exam completion dates  Eye Exam No Retinopathy No Retinopathy       Foot Form Completion   Done     This result is from an external source.

## 2022-05-04 NOTE — Patient Instructions (Signed)
Follow-up as before, call if you need Ms. sooner.  You are treated today for acute exacerbation of asthma.  Depo-Medrol 80 mg IM in the office and and nebulizer treatment is administered.  Please get a chest x-ray at the hospital this is already ordered.  Medications are sent to your local pharmacy prednisone, Tessalon Perles, azithromycin, and cough suppressant syrup.  Please get labs that have been ordered today before you leave.  Thanks for choosing [facility], we consider it a privelige to serve you.

## 2022-05-09 ENCOUNTER — Encounter: Payer: Self-pay | Admitting: Family Medicine

## 2022-05-09 NOTE — Progress Notes (Signed)
Daisy Robbins     MRN: [id number]      DOB: 1960/12/08   HPI Daisy Robbins is here with progressive cough, SOB , wheeze, also has now developed sputum production thick  ROS Denies recent fever or chills. Denies sinus pressure, nasal congestion, ear pain or sore throat.  Denies chest pains, palpitations and leg swelling Denies abdominal pain, nausea, vomiting,diarrhea or constipation.   Denies dysuria, frequency, hesitancy or incontinence. Denies joint pain, swelling and limitation in mobility. Denies headaches, seizures, numbness, or tingling. Denies depression, anxiety or insomnia. Denies skin break down or rash.   PE  BP 131/64   Pulse 87   Resp 16   Ht '5\' 3"'$  (1.6 m)   Wt 179 lb (81.2 kg)   SpO2 94%   BMI 31.71 kg/m   Patient alert and oriented and in no cardiopulmonary distress.  HEENT: No facial asymmetry, EOMI,     Neck supple .  Chest: decreased air entry bilateral wheeze few basilar crackles CVS: S1, S2 no murmurs, no S3.Regular rate.  ABD: Soft non tender.   Ext: No edema  MS: Adequate ROM spine, shoulders, hips and knees.  Skin: Intact, no ulcerations or rash noted.  Psych: Good eye contact, normal affect. Memory intact not anxious or depressed appearing.  CNS: CN 2-12 intact, power,  normal throughout.no focal deficits noted.   Assessment & Plan  Bronchial asthma Current flare with worsening symptoms in past 3 weeks. Neb treatment , depo medrol I'm , short course of high dose prednisone and antibiotic, also cXR  Essential hypertension, benign Controlled, no change in medication DASH diet and commitment to daily physical activity for a minimum of 30 minutes discussed and encouraged, as a part of hypertension management. The importance of attaining a healthy weight is also discussed.     05/04/2022    8:26 AM 11/25/2021   11:25 AM 08/09/2021   10:17 AM 06/30/2021    3:50 PM 06/30/2021    2:54 PM 03/03/2021    3:13 PM 10/12/2020   11:08 AM  BP/Weight   Systolic BP 818 563 149 702 637 858   Diastolic BP 64 82 86 82 84 82   Wt. (Lbs) 179 179.12 184  181 172 165  BMI 31.71 kg/m2 31.73 kg/m2 32.59 kg/m2  32.06 kg/m2 30.47 kg/m2 29.23 kg/m2       Type 2 diabetes mellitus with vascular disease ([place]) Updated lab needed at/ before next visit. [name] is reminded of the importance of commitment to daily physical activity for 30 minutes or more, as able and the need to limit carbohydrate intake to 30 to 60 grams per meal to help with blood sugar control.   The need to take medication as prescribed, test blood sugar as directed, and to call between visits if there is a concern that blood sugar is uncontrolled is also discussed.   [name] is reminded of the importance of daily foot exam, annual eye examination, and good blood sugar, blood pressure and cholesterol control.     Latest Ref Rng & Units 11/25/2021   12:06 PM 06/30/2021    8:28 AM 09/18/2020    9:10 AM 12/02/2019    3:46 PM 12/02/2019    2:27 PM  Diabetic Labs  HbA1c 0.0 - 7.0 % 6.8  7.3  7.0   6.7    6.7    6.7    6.7   Chol 100 - 199 mg/dL  151  177  200  HDL >39 mg/dL  78  76  90    Calc LDL 0 - 99 mg/dL  57  89  96    Triglycerides 0 - 149 mg/dL  86  62  81    Creatinine 0.57 - 1.00 mg/dL  0.81  0.95  0.80        05/04/2022    8:26 AM 11/25/2021   11:25 AM 08/09/2021   10:17 AM 06/30/2021    3:50 PM 06/30/2021    2:54 PM 03/03/2021    3:13 PM 10/12/2020   11:08 AM  BP/Weight  Systolic BP 300 923 300 762 263 335   Diastolic BP 64 82 86 82 84 82   Wt. (Lbs) 179 179.12 184  181 172 165  BMI 31.71 kg/m2 31.73 kg/m2 32.59 kg/m2  32.06 kg/m2 30.47 kg/m2 29.23 kg/m2      Latest Ref Rng & Units 07/22/2020   12:00 AM 12/02/2019    2:00 PM  Foot/eye exam completion dates  Eye Exam No Retinopathy No Retinopathy       Foot Form Completion   Done     This result is from an external source.

## 2022-05-10 ENCOUNTER — Telehealth: Payer: Self-pay | Admitting: Family Medicine

## 2022-05-10 NOTE — Telephone Encounter (Signed)
Verification chronic forms   Copied Sleeved Noted

## 2022-05-11 ENCOUNTER — Encounter: Payer: Medicare HMO | Admitting: Family Medicine

## 2022-05-17 DIAGNOSIS — Z0279 Encounter for issue of other medical certificate: Secondary | ICD-10-CM

## 2022-06-10 ENCOUNTER — Other Ambulatory Visit (HOSPITAL_COMMUNITY): Payer: Self-pay | Admitting: Family Medicine

## 2022-06-10 ENCOUNTER — Encounter: Payer: Self-pay | Admitting: Family Medicine

## 2022-06-10 ENCOUNTER — Ambulatory Visit (INDEPENDENT_AMBULATORY_CARE_PROVIDER_SITE_OTHER): Payer: Medicare HMO | Admitting: Family Medicine

## 2022-06-10 VITALS — BP 128/84 | HR 95 | Ht 63.0 in | Wt 179.1 lb

## 2022-06-10 DIAGNOSIS — J4550 Severe persistent asthma, uncomplicated: Secondary | ICD-10-CM

## 2022-06-10 DIAGNOSIS — I10 Essential (primary) hypertension: Secondary | ICD-10-CM | POA: Diagnosis not present

## 2022-06-10 DIAGNOSIS — E559 Vitamin D deficiency, unspecified: Secondary | ICD-10-CM | POA: Diagnosis not present

## 2022-06-10 DIAGNOSIS — E785 Hyperlipidemia, unspecified: Secondary | ICD-10-CM

## 2022-06-10 DIAGNOSIS — Z0001 Encounter for general adult medical examination with abnormal findings: Secondary | ICD-10-CM | POA: Diagnosis not present

## 2022-06-10 DIAGNOSIS — Z1231 Encounter for screening mammogram for malignant neoplasm of breast: Secondary | ICD-10-CM

## 2022-06-10 DIAGNOSIS — E1159 Type 2 diabetes mellitus with other circulatory complications: Secondary | ICD-10-CM | POA: Diagnosis not present

## 2022-06-10 DIAGNOSIS — Z1211 Encounter for screening for malignant neoplasm of colon: Secondary | ICD-10-CM | POA: Diagnosis not present

## 2022-06-10 DIAGNOSIS — Z23 Encounter for immunization: Secondary | ICD-10-CM

## 2022-06-10 DIAGNOSIS — J4551 Severe persistent asthma with (acute) exacerbation: Secondary | ICD-10-CM

## 2022-06-10 MED ORDER — PREDNISONE 5 MG (21) PO TBPK: 5.0000 mg | ORAL_TABLET | ORAL | 0 refills | Status: DC

## 2022-06-10 MED ORDER — OYSTER SHELL CALCIUM/D3 500-5 MG-MCG PO TABS: 1.0000 | ORAL_TABLET | Freq: Two times a day (BID) | ORAL | 11 refills | Status: DC

## 2022-06-10 NOTE — Assessment & Plan Note (Signed)

## 2022-06-10 NOTE — Patient Instructions (Addendum)
Follow-up in 13 weeks, call if you need me sooner.  Flu vaccine today.  DuoNeb treatment x 1 today.  You have a prescription for a 6-day course of prednisone please do not start taking this until next week Wednesday.  Please get both the COVID-vaccine and the RSV vaccine over the next 2 to 3 weeks.  Start taking oscal D 1 tablet twice daily for bone health , prescription is sent to your pharmacy  You are referred to pulmonary specialist for management of chronic uncontrolled bronchitis and asthma.  Please schedule mammogram at checkout.  You are referred for colonoscopy it is vital that you get this follow through with the paperwork please  Labs today microalbumin urea CBC lipid CMP and EGFR TSH vitamin D and HbA1c.  Best for 2024!  Thanks for choosing [facility], we consider it a privelige to serve you.

## 2022-06-10 NOTE — Progress Notes (Unsigned)
Daisy Robbins     MRN: [id number]      DOB: 08/31/1960  HPI: Patient is in for annual physical exam. Chronic bronchitis and asthma is addressed, neb treatment administered at office and referred to pulmonary Immunization is reviewed , and  updated if needed.   PE: BP 128/84   Pulse 95   Ht '5\' 3"'$  (1.6 m)   Wt 179 lb 1.9 oz (81.2 kg)   SpO2 92%   BMI 31.73 kg/m    Pleasant  female, alert and oriented x 3, in mild  cardio-pulmonary distress. Afebrile. HEENT No facial trauma or asymetry. Sinuses non tender.  Extra occullar muscles intact.. External ears normal, . Neck: supple, no adenopathy,JVD or thyromegaly.No bruits.  Chest: Decreased air entry, bilateral wheezing  Non tender to palpation  Cardiovascular system; Heart sounds normal,  S1 and  S2 ,no S3.  No murmur, or thrill. Apical beat not displaced Peripheral pulses normal.  Abdomen: Soft, non tender, no organomegaly or masses. No bruits. Bowel sounds normal. No guarding, tenderness or rebound.     Musculoskeletal exam: Full ROM of spine, hips , shoulders and knees. No deformity ,swelling or crepitus noted. No muscle wasting or atrophy.   Neurologic: Cranial nerves 2 to 12 intact. Power, tone ,sensation and reflexes normal throughout. No disturbance in gait. No tremor.  Skin: Intact, no ulceration, erythema , scaling or rash noted. Pigmentation normal throughout  Psych; Normal mood and affect. Judgement and concentration normal   Assessment & Plan:  Encounter for Medicare annual examination with abnormal findings .Annual exam as documented. Counseling done  re healthy lifestyle involving commitment to 150 minutes exercise per week, heart healthy diet, and attaining healthy weight.The importance of adequate sleep also discussed. Regular seat belt use and home safety, is also discussed. Changes in health habits are decided on by the patient with goals and time frames  set for achieving  them. Immunization and cancer screening needs are specifically addressed at this visit.   Severe persistent asthma Duoneb treatment followed by prednisone dose pack next week Refer to pulmonary for management  Essential hypertension, benign Controlled, no change in medication   Type 2 diabetes mellitus with vascular disease ([place]) Daisy Robbins is reminded of the importance of commitment to daily physical activity for 30 minutes or more, as able and the need to limit carbohydrate intake to 30 to 60 grams per meal to help with blood sugar control.   The need to take medication as prescribed, test blood sugar as directed, and to call between visits if there is a concern that blood sugar is uncontrolled is also discussed.   Daisy Robbins is reminded of the importance of daily foot exam, annual eye examination, and good blood sugar, blood pressure and cholesterol control.     Latest Ref Rng & Units 06/10/2022    9:30 AM 11/25/2021   12:06 PM 06/30/2021    8:28 AM 09/18/2020    9:10 AM 12/02/2019    3:46 PM  Diabetic Labs  HbA1c 4.8 - 5.6 % 8.1  6.8  7.3  7.0    Micro/Creat Ratio  WILL FOLLOW  P      Chol 100 - 199 mg/dL 182   151  177  200   HDL >39 mg/dL 89   78  76  90   Calc LDL 0 - 99 mg/dL 79   57  89  96   Triglycerides 0 - 149 mg/dL 75   86  62  81   Creatinine 0.57 - 1.00 mg/dL 0.87   0.81  0.95  0.80     P Preliminary result      06/10/2022    9:16 AM 06/10/2022    8:30 AM 05/04/2022    8:26 AM 11/25/2021   11:25 AM 08/09/2021   10:17 AM 06/30/2021    3:50 PM 06/30/2021    2:54 PM  BP/Weight  Systolic BP 053 976 734 193 790 240 973  Diastolic BP 84 87 64 82 86 82 84  Wt. (Lbs)  179.12 179 179.12 184  181  BMI  31.73 kg/m2 31.71 kg/m2 31.73 kg/m2 32.59 kg/m2  32.06 kg/m2      Latest Ref Rng & Units 06/10/2022    8:20 AM 07/22/2020   12:00 AM  Foot/eye exam completion dates  Eye Exam No Retinopathy  No Retinopathy      Foot Form Completion  Done      This result is from an external  source.      Uncontrolled, likely due to recurrent prednisone, however med adjustment needed, would recommend low dose glipizide 2.5 mg daily, needs 3 month repeat  Vitamin D deficiency Updated lab shows adequately corrected, needs to commit to calcium with D for bone health however

## 2022-06-11 ENCOUNTER — Encounter: Payer: Self-pay | Admitting: Family Medicine

## 2022-06-11 DIAGNOSIS — E559 Vitamin D deficiency, unspecified: Secondary | ICD-10-CM | POA: Insufficient documentation

## 2022-06-11 MED ORDER — GLIPIZIDE ER 2.5 MG PO TB24: 2.5000 mg | ORAL_TABLET | Freq: Every day | ORAL | 3 refills | Status: DC

## 2022-06-11 NOTE — Assessment & Plan Note (Signed)
Controlled, no change in medication  

## 2022-06-11 NOTE — Assessment & Plan Note (Signed)
Updated lab shows adequately corrected, needs to commit to calcium with D for bone health however

## 2022-06-11 NOTE — Assessment & Plan Note (Signed)
Duoneb treatment followed by prednisone dose pack next week Refer to pulmonary for management

## 2022-06-11 NOTE — Assessment & Plan Note (Signed)
Ms. Shaheed is reminded of the importance of commitment to daily physical activity for 30 minutes or more, as able and the need to limit carbohydrate intake to 30 to 60 grams per meal to help with blood sugar control.   The need to take medication as prescribed, test blood sugar as directed, and to call between visits if there is a concern that blood sugar is uncontrolled is also discussed.   Ms. Harb is reminded of the importance of daily foot exam, annual eye examination, and good blood sugar, blood pressure and cholesterol control.     Latest Ref Rng & Units 06/10/2022    9:30 AM 11/25/2021   12:06 PM 06/30/2021    8:28 AM 09/18/2020    9:10 AM 12/02/2019    3:46 PM  Diabetic Labs  HbA1c 4.8 - 5.6 % 8.1  6.8  7.3  7.0    Micro/Creat Ratio  WILL FOLLOW  P      Chol 100 - 199 mg/dL 182   151  177  200   HDL >39 mg/dL 89   78  76  90   Calc LDL 0 - 99 mg/dL 79   57  89  96   Triglycerides 0 - 149 mg/dL 75   86  62  81   Creatinine 0.57 - 1.00 mg/dL 0.87   0.81  0.95  0.80     P Preliminary result      06/10/2022    9:16 AM 06/10/2022    8:30 AM 05/04/2022    8:26 AM 11/25/2021   11:25 AM 08/09/2021   10:17 AM 06/30/2021    3:50 PM 06/30/2021    2:54 PM  BP/Weight  Systolic BP 240 973 532 992 426 834 196  Diastolic BP 84 87 64 82 86 82 84  Wt. (Lbs)  179.12 179 179.12 184  181  BMI  31.73 kg/m2 31.71 kg/m2 31.73 kg/m2 32.59 kg/m2  32.06 kg/m2      Latest Ref Rng & Units 06/10/2022    8:20 AM 07/22/2020   12:00 AM  Foot/eye exam completion dates  Eye Exam No Retinopathy  No Retinopathy      Foot Form Completion  Done      This result is from an external source.      Uncontrolled, likely due to recurrent prednisone, however med adjustment needed, would recommend low dose glipizide 2.5 mg daily, needs 3 month repeat

## 2022-06-13 ENCOUNTER — Telehealth: Payer: Self-pay | Admitting: Family Medicine

## 2022-06-13 LAB — CMP14+EGFR
ALT: 18 IU/L (ref 0–32)
AST: 21 IU/L (ref 0–40)
Albumin/Globulin Ratio: 1.6 (ref 1.2–2.2)
Albumin: 4.6 g/dL (ref 3.9–4.9)
Alkaline Phosphatase: 125 IU/L — ABNORMAL HIGH (ref 44–121)
BUN/Creatinine Ratio: 15 (ref 12–28)
BUN: 13 mg/dL (ref 8–27)
Bilirubin Total: 0.3 mg/dL (ref 0.0–1.2)
CO2: 28 mmol/L (ref 20–29)
Calcium: 11 mg/dL — ABNORMAL HIGH (ref 8.7–10.3)
Chloride: 97 mmol/L (ref 96–106)
Creatinine, Ser: 0.87 mg/dL (ref 0.57–1.00)
Globulin, Total: 2.9 g/dL (ref 1.5–4.5)
Glucose: 108 mg/dL — ABNORMAL HIGH (ref 70–99)
Potassium: 3.8 mmol/L (ref 3.5–5.2)
Sodium: 141 mmol/L (ref 134–144)
Total Protein: 7.5 g/dL (ref 6.0–8.5)
eGFR: 75 mL/min/{1.73_m2} (ref >59)

## 2022-06-13 LAB — TSH: TSH: 0.827 u[IU]/mL (ref 0.450–4.500)

## 2022-06-13 LAB — VITAMIN D 25 HYDROXY (VIT D DEFICIENCY, FRACTURES): Vit D, 25-Hydroxy: 32.6 ng/mL (ref 30.0–100.0)

## 2022-06-13 LAB — CBC
Hematocrit: 37.4 % (ref 34.0–46.6)
Hemoglobin: 12.3 g/dL (ref 11.1–15.9)
MCH: 22.7 pg — ABNORMAL LOW (ref 26.6–33.0)
MCHC: 32.9 g/dL (ref 31.5–35.7)
MCV: 69 fL — ABNORMAL LOW (ref 79–97)
Platelets: 343 10*3/uL (ref 150–450)
RBC: 5.41 x10E6/uL — ABNORMAL HIGH (ref 3.77–5.28)
RDW: 14.6 % (ref 11.7–15.4)
WBC: 11.3 10*3/uL — ABNORMAL HIGH (ref 3.4–10.8)

## 2022-06-13 LAB — LIPID PANEL
Chol/HDL Ratio: 2 ratio (ref 0.0–4.4)
Cholesterol, Total: 182 mg/dL (ref 100–199)
HDL: 89 mg/dL (ref >39)
LDL Chol Calc (NIH): 79 mg/dL (ref 0–99)
Triglycerides: 75 mg/dL (ref 0–149)
VLDL Cholesterol Cal: 14 mg/dL (ref 5–40)

## 2022-06-13 LAB — MICROALBUMIN / CREATININE URINE RATIO
Creatinine, Urine: 62.1 mg/dL
Microalb/Creat Ratio: <5 mg/g creat (ref 0–29)
Microalbumin, Urine: <3 ug/mL

## 2022-06-13 LAB — HEMOGLOBIN A1C
Est. average glucose Bld gHb Est-mCnc: 186 mg/dL
Hgb A1c MFr Bld: 8.1 % — ABNORMAL HIGH (ref 4.8–5.6)

## 2022-06-13 NOTE — Telephone Encounter (Signed)
Pt is having issue with getting her medication. Can you please contact patient? States something about a form was supposed to have been faxed?

## 2022-06-14 ENCOUNTER — Other Ambulatory Visit: Payer: Self-pay

## 2022-06-14 MED ORDER — IPRATROPIUM BROMIDE 0.02 % IN SOLN
0.5000 mg | Freq: Once | RESPIRATORY_TRACT | Status: AC
  Administered 2022-06-10: 0.5 mg via RESPIRATORY_TRACT

## 2022-06-14 MED ORDER — ALBUTEROL SULFATE (2.5 MG/3ML) 0.083% IN NEBU
2.5000 mg | INHALATION_SOLUTION | Freq: Once | RESPIRATORY_TRACT | Status: AC
  Administered 2022-06-10: 2.5 mg via RESPIRATORY_TRACT

## 2022-06-14 MED ORDER — JANUMET XR 50-1000 MG PO TB24: ORAL_TABLET | ORAL | 2 refills | Status: DC

## 2022-06-14 NOTE — Addendum Note (Signed)
Addended by: Quentin Angst on: 06/14/2022 08:01 AM   Modules accepted: Orders

## 2022-06-14 NOTE — Telephone Encounter (Signed)
Janumet sent to [name] per patient request.

## 2022-06-16 ENCOUNTER — Encounter (INDEPENDENT_AMBULATORY_CARE_PROVIDER_SITE_OTHER): Payer: Self-pay | Admitting: *Deleted

## 2022-06-22 ENCOUNTER — Telehealth: Payer: Self-pay | Admitting: Family Medicine

## 2022-06-22 MED ORDER — PROMETHAZINE-DM 6.25-15 MG/5ML PO SYRP: ORAL_SOLUTION | ORAL | 0 refills | Status: DC

## 2022-06-22 NOTE — Telephone Encounter (Signed)
Patient aware

## 2022-06-22 NOTE — Telephone Encounter (Signed)
Medication is sent to her pharmacy, pls let her know

## 2022-06-22 NOTE — Telephone Encounter (Signed)
When pt was seen last she was told to call back if she need cough syrup. She is wanting to know if she can still get this?

## 2022-07-08 ENCOUNTER — Ambulatory Visit (HOSPITAL_COMMUNITY)
Admission: RE | Admit: 2022-07-08 | Discharge: 2022-07-08 | Disposition: A | Discharge status: Home / Self Care | Payer: Medicare HMO | Source: Ambulatory Visit | Attending: Family Medicine | Admitting: Family Medicine

## 2022-07-08 DIAGNOSIS — Z1231 Encounter for screening mammogram for malignant neoplasm of breast: Secondary | ICD-10-CM | POA: Diagnosis not present

## 2022-07-19 ENCOUNTER — Telehealth: Payer: Self-pay | Admitting: Family Medicine

## 2022-07-19 ENCOUNTER — Other Ambulatory Visit: Payer: Self-pay

## 2022-07-19 MED ORDER — ALBUTEROL SULFATE HFA 108 (90 BASE) MCG/ACT IN AERS: 2.0000 | INHALATION_SPRAY | Freq: Four times a day (QID) | RESPIRATORY_TRACT | 3 refills | Status: DC | PRN

## 2022-07-19 MED ORDER — BUDESONIDE-FORMOTEROL FUMARATE 80-4.5 MCG/ACT IN AERO: 2.0000 | INHALATION_SPRAY | Freq: Two times a day (BID) | RESPIRATORY_TRACT | 3 refills | Status: DC

## 2022-07-19 MED ORDER — PROAIR HFA 108 (90 BASE) MCG/ACT IN AERS: 2.0000 | INHALATION_SPRAY | Freq: Four times a day (QID) | RESPIRATORY_TRACT | 3 refills | Status: AC | PRN

## 2022-07-19 NOTE — Telephone Encounter (Signed)
Patient called in needs refill on   albuterol (VENTOLIN HFA) 108 (90 Base) MCG/ACT inhaler   budesonide-formoterol (SYMBICORT) 80-4.5 MCG/ACT inhaler   PROAIR HFA 108 (90 Base) MCG/ACT inhaler     [id number] [place] [facility] [address] [facility] [id number]: [phone] Fax: [phone] #: [id number]

## 2022-07-19 NOTE — Telephone Encounter (Signed)
Refills sent

## 2022-07-20 ENCOUNTER — Telehealth: Payer: Medicare HMO | Admitting: Family Medicine

## 2022-07-21 ENCOUNTER — Encounter: Payer: Self-pay | Admitting: Family Medicine

## 2022-07-22 ENCOUNTER — Telehealth: Payer: Self-pay | Admitting: Family Medicine

## 2022-07-22 ENCOUNTER — Other Ambulatory Visit: Payer: Self-pay

## 2022-07-22 DIAGNOSIS — J4550 Severe persistent asthma, uncomplicated: Secondary | ICD-10-CM

## 2022-07-22 MED ORDER — IPRATROPIUM BROMIDE 0.02 % IN SOLN: 0.5000 mg | Freq: Four times a day (QID) | RESPIRATORY_TRACT | 12 refills | Status: DC

## 2022-07-22 MED ORDER — ALBUTEROL SULFATE (2.5 MG/3ML) 0.083% IN NEBU: 2.5000 mg | INHALATION_SOLUTION | Freq: Four times a day (QID) | RESPIRATORY_TRACT | 1 refills | Status: DC | PRN

## 2022-07-22 NOTE — Telephone Encounter (Signed)
error 

## 2022-07-22 NOTE — Telephone Encounter (Signed)
Pt called stating that Daisy Robbins is telling her that they have not received the order for the nebulizer machine. Can you please resend?

## 2022-07-22 NOTE — Telephone Encounter (Signed)
Nebulizer and medication sent patient aware

## 2022-07-22 NOTE — Telephone Encounter (Signed)
Rx faxed to [facility]

## 2022-07-22 NOTE — Telephone Encounter (Signed)
Pt is wanting to asking if she can please get a nebulizer?

## 2022-08-04 ENCOUNTER — Ambulatory Visit (INDEPENDENT_AMBULATORY_CARE_PROVIDER_SITE_OTHER): Payer: Medicare HMO

## 2022-08-04 DIAGNOSIS — Z0000 Encounter for general adult medical examination without abnormal findings: Secondary | ICD-10-CM

## 2022-08-04 NOTE — Patient Instructions (Signed)
  Ms. Dellarocco , Thank you for taking time to come for your Medicare Wellness Visit. I appreciate your ongoing commitment to your health goals. Please review the following plan we discussed and let me know if I can assist you in the future.   These are the goals we discussed:  Goals      Patient Stated     Continue to grow in ministry.        This is a list of the screening recommended for you and due dates:  Health Maintenance  Topic Date Due   Zoster (Shingles) Vaccine (1 of 2) Never done   Pap Smear  10/30/2016   DTaP/Tdap/Td vaccine (2 - Td or Tdap) 11/02/2021   COVID-19 Vaccine (5 - 2023-24 season) 02/04/2022   Colon Cancer Screening  03/01/2022   Hemoglobin A1C  12/09/2022   Eye exam for diabetics  03/15/2023   Yearly kidney function blood test for diabetes  06/11/2023   Yearly kidney health urinalysis for diabetes  06/11/2023   Complete foot exam   06/11/2023   Medicare Annual Wellness Visit  08/04/2023   Mammogram  07/08/2024   Flu Shot  Completed   Hepatitis C Screening: USPSTF Recommendation to screen - Ages 18-79 yo.  Completed   HIV Screening  Completed   HPV Vaccine  Aged Out

## 2022-08-04 NOTE — Progress Notes (Addendum)
Subjective:   Daisy Robbins is a 62 y.o. female who presents for Medicare Annual (Subsequent) preventive examination.  Review of Systems    I connected with  Daisy Robbins on 08/04/22 by a audio enabled telemedicine application and verified that I am speaking with the correct person using two identifiers.  Patient Location: Home  Provider Location: Office/Clinic  I discussed the limitations of evaluation and management by telemedicine. The patient expressed understanding and agreed to proceed.        Objective:    There were no vitals filed for this visit. There is no height or weight on file to calculate BMI.     03/01/2012    8:27 AM  Advanced Directives  Does Patient Have a Medical Advance Directive? Patient does not have advance directive;Patient would not like information    Current Medications (verified) Outpatient Encounter Medications as of 08/04/2022  Medication Sig   albuterol (PROVENTIL) (2.5 MG/3ML) 0.083% nebulizer solution Take 3 mLs (2.5 mg total) by nebulization every 6 (six) hours as needed for wheezing or shortness of breath.   ipratropium (ATROVENT) 0.02 % nebulizer solution Take 2.5 mLs (0.5 mg total) by nebulization 4 (four) times daily.   albuterol (VENTOLIN HFA) 108 (90 Base) MCG/ACT inhaler Inhale 2 puffs into the lungs every 6 (six) hours as needed for wheezing or shortness of breath.   benazepril-hydrochlorthiazide (LOTENSIN HCT) 20-12.5 MG tablet Take two tablets by mouht once daily for blood pressure   budesonide-formoterol (SYMBICORT) 80-4.5 MCG/ACT inhaler Inhale 2 puffs into the lungs 2 (two) times daily.   calcium-vitamin D (OSCAL WITH D) 500-5 MG-MCG tablet Take 1 tablet by mouth 2 (two) times daily.   glipiZIDE (GLUCOTROL XL) 2.5 MG 24 hr tablet Take 1 tablet (2.5 mg total) by mouth daily with breakfast.   guaiFENesin-codeine (ROBITUSSIN AC) 100-10 MG/5ML syrup Take one teaspoon by mouth at bedtime , as needed, for excessive cough    latanoprost (XALATAN) 0.005 % ophthalmic solution 1 drop at bedtime.   loratadine (CLARITIN) 10 MG tablet TAKE 1 TABLET BY MOUTH EVERY DAY   montelukast (SINGULAIR) 10 MG tablet TAKE 1 TABLET BY MOUTH EVERYDAY AT BEDTIME   predniSONE (STERAPRED UNI-PAK 21 TAB) 5 MG (21) TBPK tablet Take 1 tablet (5 mg total) by mouth as directed. Use as directed   PROAIR HFA 108 (90 Base) MCG/ACT inhaler Inhale 2 puffs into the lungs every 6 (six) hours as needed for wheezing or shortness of breath.   promethazine-dextromethorphan (PROMETHAZINE-DM) 6.25-15 MG/5ML syrup One teaspoon two times daily as needed, for excessive cough   rosuvastatin (CRESTOR) 5 MG tablet TAKE ONE TABLET BY MOUTH EVERY MONDAY AND THURSDAY   SitaGLIPtin-MetFORMIN HCl (JANUMET XR) 50-1000 MG TB24 TAKE 2 TABLETS BY MOUTH EVERY DAY (HIGH COST HOLD)   No facility-administered encounter medications on file as of 08/04/2022.    Allergies (verified) Patient has no known allergies.   History: Past Medical History:  Diagnosis Date   Allergy    Diabetes mellitus    Hypertension    Past Surgical History:  Procedure Laterality Date   ABDOMINAL HYSTERECTOMY     CESAREAN SECTION     COLONOSCOPY  03/01/2012   Procedure: COLONOSCOPY;  Surgeon: Rogene Houston, MD;  Location: [facility];  Service: Endoscopy;  Laterality: N/A;  950   right carpal tunnel release     Family History  Problem Relation Age of Onset   Hypertension Mother    Cancer Father  colon    Hypertension Sister    Diabetes Sister    Hypertension Sister    Hypertension Brother    Asthma Brother    Colon cancer Neg Hx    Social History   Socioeconomic History   Marital status: Married    Spouse name: Not on file   Number of children: Not on file   Years of education: Not on file   Highest education level: Not on file  Occupational History   Not on file  Tobacco Use   Smoking status: Never   Smokeless tobacco: Never  Vaping Use   Vaping Use: Never  used  Substance and Sexual Activity   Alcohol use: No   Drug use: No   Sexual activity: Not on file  Other Topics Concern   Not on file  Social History Narrative   Not on file   Social Determinants of Health   Financial Resource Strain: Not on file  Food Insecurity: Not on file  Transportation Needs: Not on file  Physical Activity: Not on file  Stress: Not on file  Social Connections: Not on file    Tobacco Counseling Counseling given: Not Answered   Clinical Intake:   Diabetic?yes Nutrition Risk Assessment:  Has the patient had any N/V/D within the last 2 months?  No  Does the patient have any non-healing wounds?  No  Has the patient had any unintentional weight loss or weight gain?  No   Diabetes:  Is the patient diabetic?  Yes  If diabetic, was a CBG obtained today?  No  Did the patient bring in their glucometer from home?  No  How often do you monitor your CBG's? never.   Financial Strains and Diabetes Management:  Are you having any financial strains with the device, your supplies or your medication? No .  Does the patient want to be seen by Chronic Care Management for management of their diabetes?  No  Would the patient like to be referred to a Nutritionist or for Diabetic Management?  No   Diabetic Exams:  Diabetic Eye Exam: Completed 03/14/22 Diabetic Foot Exam: Completed 06/10/22           Activities of Daily Living     No data to display           Patient Care Team: Fayrene Helper, MD as PCP - General  Indicate any recent Medical Services you may have received from other than [facility] providers in the past year (date may be approximate).     Assessment:   This is a routine wellness examination for Daisy Robbins.  Hearing/Vision screen No results found.  Dietary issues and exercise activities discussed:     Goals Addressed   None   Depression Screen    06/10/2022    8:31 AM 05/04/2022    8:27 AM 11/25/2021   11:25 AM 07/27/2021     8:59 AM 06/30/2021    2:55 PM 03/03/2021    3:15 PM 10/12/2020   11:11 AM  PHQ 2/9 Scores  PHQ - 2 Score 0 0 0 0 0 0 0    Fall Risk    06/10/2022    8:31 AM 05/04/2022    8:27 AM 04/11/2022    9:08 AM 11/25/2021   11:25 AM 07/27/2021    8:59 AM  Fall Risk   Falls in the past year? 0 0 0 0 0  Number falls in past yr: 0 0 0 0 0  Injury with Fall?  0 0 0 0 0  Risk for fall due to : No Fall Risks  No Fall Risks No Fall Risks No Fall Risks  Follow up Falls evaluation completed   Falls evaluation completed Falls evaluation completed    [place]:  Any stairs in or around the home? Yes  If so, are there any without handrails? No  Home free of loose throw rugs in walkways, pet beds, electrical cords, etc? Yes  Adequate lighting in your home to reduce risk of falls? Yes   ASSISTIVE DEVICES UTILIZED TO PREVENT FALLS:  Life alert? No  Use of a cane, walker or w/c? No  Grab bars in the bathroom? No  Shower chair or bench in shower? No  Elevated toilet seat or a handicapped toilet? No           06/30/2021    2:57 PM  6CIT Screen  What Year? 0 points  What month? 0 points  What time? 0 points  Count back from 20 0 points    Immunizations Immunization History  Administered Date(s) Administered   Influenza Whole 02/24/2009, 03/17/2009, 03/08/2010   Influenza,inj,Quad PF,6+ Mos 02/28/2013, 06/30/2014, 02/22/2016, 03/25/2018, 03/20/2019, 04/03/2020, 03/03/2021, 06/10/2022   Influenza,inj,quad, With Preservative 03/06/2018   Moderna Sars-Covid-2 Vaccination 08/06/2019, 09/03/2019, 04/06/2020, 03/06/2021   Pneumococcal Polysaccharide-23 10/30/2013   Tdap 11/03/2011    TDAP status: Due, Education has been provided regarding the importance of this vaccine. Advised may receive this vaccine at local pharmacy or Health Dept. Aware to provide a copy of the vaccination record if obtained from local pharmacy or Health Dept. Verbalized acceptance and  understanding.  Flu Vaccine status: Up to date    Covid-19 vaccine status: Completed vaccines  Qualifies for Shingles Vaccine? Yes   Zostavax completed No   Shingrix Completed?: No.    Education has been provided regarding the importance of this vaccine. Patient has been advised to call insurance company to determine out of pocket expense if they have not yet received this vaccine. Advised may also receive vaccine at local pharmacy or Health Dept. Verbalized acceptance and understanding.  Screening Tests Health Maintenance  Topic Date Due   Zoster Vaccines- Shingrix (1 of 2) Never done   PAP SMEAR-Modifier  10/30/2016   DTaP/Tdap/Td (2 - Td or Tdap) 11/02/2021   COVID-19 Vaccine (5 - 2023-24 season) 02/04/2022   COLONOSCOPY (Pts 45-64yr Insurance coverage will need to be confirmed)  03/01/2022   HEMOGLOBIN A1C  12/09/2022   OPHTHALMOLOGY EXAM  03/15/2023   Diabetic kidney evaluation - eGFR measurement  06/11/2023   Diabetic kidney evaluation - Urine ACR  06/11/2023   FOOT EXAM  06/11/2023   Medicare Annual Wellness (AWV)  08/04/2023   MAMMOGRAM  07/08/2024   INFLUENZA VACCINE  Completed   Hepatitis C Screening  Completed   HIV Screening  Completed   HPV VACCINES  Aged Out    Health Maintenance  Health Maintenance Due  Topic Date Due   Zoster Vaccines- Shingrix (1 of 2) Never done   PAP SMEAR-Modifier  10/30/2016   DTaP/Tdap/Td (2 - Td or Tdap) 11/02/2021   COVID-19 Vaccine (5 - 2023-24 season) 02/04/2022   COLONOSCOPY (Pts 45-467yrInsurance coverage will need to be confirmed)  03/01/2022    Colorectal cancer screening: Referral to GI placed  . Pt aware the office will call re: appt.  Mammogram status: Completed 07/08/22. Repeat every year    Lung Cancer Screening: (Low Dose CT Chest recommended if Age  55-80 years, 30 pack-year currently smoking OR have quit w/in 15years.) does not qualify.   Lung Cancer Screening Referral: no  Additional Screening:  Hepatitis  C Screening: does qualify; Completed 11/01/16  Vision Screening: Recommended annual ophthalmology exams for early detection of glaucoma and other disorders of the eye. Is the patient up to date with their annual eye exam?  Yes  Who is the provider or what is the name of the office in which the patient attends annual eye exams? N/a If pt is not established with a provider, would they like to be referred to a provider to establish care? No .   Dental Screening: Recommended annual dental exams for proper oral hygiene  Community Resource Referral / Chronic Care Management: CRR required this visit?  No   CCM required this visit?  No      Plan:     I have personally reviewed and noted the following in the patient's chart:   Medical and social history Use of alcohol, tobacco or illicit drugs  Current medications and supplements including opioid prescriptions. Patient is not currently taking opioid prescriptions. Functional ability and status Nutritional status Physical activity Advanced directives List of other physicians Hospitalizations, surgeries, and ER visits in previous 12 months Vitals Screenings to include cognitive, depression, and falls Referrals and appointments  In addition, I have reviewed and discussed with patient certain preventive protocols, quality metrics, and best practice recommendations. A written personalized care plan for preventive services as well as general preventive health recommendations were provided to patient.     Quentin Angst, [place]   08/04/2022

## 2022-08-05 ENCOUNTER — Ambulatory Visit: Payer: Medicare HMO | Admitting: Internal Medicine

## 2022-08-05 ENCOUNTER — Encounter: Payer: Self-pay | Admitting: Internal Medicine

## 2022-08-05 VITALS — BP 130/74 | HR 76 | Ht 63.0 in | Wt 183.4 lb

## 2022-08-05 DIAGNOSIS — R0609 Other forms of dyspnea: Secondary | ICD-10-CM | POA: Diagnosis not present

## 2022-08-05 DIAGNOSIS — J4550 Severe persistent asthma, uncomplicated: Secondary | ICD-10-CM

## 2022-08-05 LAB — POCT EXHALED NITRIC OXIDE: FeNO level (ppb): 36

## 2022-08-05 MED ORDER — FAMOTIDINE 20 MG PO TABS: ORAL_TABLET | ORAL | 11 refills | Status: DC

## 2022-08-05 MED ORDER — PANTOPRAZOLE SODIUM 40 MG PO TBEC: 40.0000 mg | DELAYED_RELEASE_TABLET | Freq: Every day | ORAL | 2 refills | Status: DC

## 2022-08-05 MED ORDER — VALSARTAN-HYDROCHLOROTHIAZIDE 160-12.5 MG PO TABS: 1.0000 | ORAL_TABLET | Freq: Every day | ORAL | 11 refills | Status: DC

## 2022-08-05 NOTE — Progress Notes (Signed)
CHERYCE GRANDSTAFF, female    DOB: 1960/12/02    MRN: [id number]   Brief patient profile:  42  yobf  never smoker  referred to pulmonary clinic in [place]  08/05/2022 by Dr Moshe Cipro for ? Asthma onset around 2020 with sob and cough / subjective wheeze      History of Present Illness  08/05/2022  Pulmonary/ 1st office eval/ Narcisa Ganesh / [place] on lisinopril  Chief Complaint  Patient presents with   Consult    Asthma/ cough but better now   Dyspnea:  no longer ex afraid the wheezing will come back  Cough: comes and goes with breathing / worse with cold air exposure Sleep: no  resp cc flat be one puffy pillow  SABA use: hasn't needed x several weeks   02 none   No obvious other patterns in  day to day or daytime variability or assoc excess/ purulent sputum or mucus plugs or hemoptysis or cp or chest tightness, subjective wheeze or overt sinus or hb symptoms.   Sleeping now  without nocturnal  or early am exacerbation  of respiratory  c/o's or need for noct saba. Also denies any obvious fluctuation of symptoms with weather or environmental changes or other aggravating or alleviating factors except as outlined above   No unusual exposure hx or h/o childhood pna/ asthma or knowledge of premature birth.  Current Allergies, Complete Past Medical History, Past Surgical History, Family History, and Social History were reviewed in [employer] record.  ROS  The following are not active complaints unless bolded Hoarseness, sore throat, dysphagia, dental problems, itching, sneezing,  nasal congestion or discharge of excess mucus or purulent secretions, ear ache,   fever, chills, sweats, unintended wt loss or wt gain, classically pleuritic or exertional cp,  orthopnea pnd or arm/hand swelling  or leg swelling, presyncope, palpitations, abdominal pain, anorexia, nausea, vomiting, diarrhea  or change in bowel habits or change in bladder habits, change in stools or change in  urine, dysuria, hematuria,  rash, arthralgias, visual complaints, headache, numbness, weakness or ataxia or problems with walking or coordination,  change in mood or  memory.              Past Medical History:  Diagnosis Date   Allergy    Diabetes mellitus    Hypertension     Outpatient Medications Prior to Visit  Medication Sig Dispense Refill   albuterol (PROVENTIL) (2.5 MG/3ML) 0.083% nebulizer solution Take 3 mLs (2.5 mg total) by nebulization every 6 (six) hours as needed for wheezing or shortness of breath. 150 mL 1   albuterol (VENTOLIN HFA) 108 (90 Base) MCG/ACT inhaler Inhale 2 puffs into the lungs every 6 (six) hours as needed for wheezing or shortness of breath. 18 g 3   benazepril-hydrochlorthiazide (LOTENSIN HCT) 20-12.5 MG tablet Take two tablets by mouht once daily for blood pressure 180 tablet 2   budesonide-formoterol (SYMBICORT) 80-4.5 MCG/ACT inhaler Inhale 2 puffs into the lungs 2 (two) times daily. 1 each 3   calcium-vitamin D (OSCAL WITH D) 500-5 MG-MCG tablet Take 1 tablet by mouth 2 (two) times daily. 60 tablet 11   glipiZIDE (GLUCOTROL XL) 2.5 MG 24 hr tablet Take 1 tablet (2.5 mg total) by mouth daily with breakfast. 30 tablet 3   ipratropium (ATROVENT) 0.02 % nebulizer solution Take 2.5 mLs (0.5 mg total) by nebulization 4 (four) times daily. 75 mL 12   latanoprost (XALATAN) 0.005 % ophthalmic solution 1 drop at bedtime.  loratadine (CLARITIN) 10 MG tablet TAKE 1 TABLET BY MOUTH EVERY DAY 90 tablet 1   montelukast (SINGULAIR) 10 MG tablet TAKE 1 TABLET BY MOUTH EVERYDAY AT BEDTIME 90 tablet 1   predniSONE (STERAPRED UNI-PAK 21 TAB) 5 MG (21) TBPK tablet Take 1 tablet (5 mg total) by mouth as directed. Use as directed 21 tablet 0   PROAIR HFA 108 (90 Base) MCG/ACT inhaler Inhale 2 puffs into the lungs every 6 (six) hours as needed for wheezing or shortness of breath. 18 each 3   rosuvastatin (CRESTOR) 5 MG tablet TAKE ONE TABLET BY MOUTH EVERY MONDAY AND  THURSDAY 24 tablet 5   SitaGLIPtin-MetFORMIN HCl (JANUMET XR) 50-1000 MG TB24 TAKE 2 TABLETS BY MOUTH EVERY DAY (HIGH COST HOLD) 60 tablet 2   guaiFENesin-codeine (ROBITUSSIN AC) 100-10 MG/5ML syrup Take one teaspoon by mouth at bedtime , as needed, for excessive cough (Patient not taking: Reported on 08/05/2022) 120 mL 0   promethazine-dextromethorphan (PROMETHAZINE-DM) 6.25-15 MG/5ML syrup One teaspoon two times daily as needed, for excessive cough (Patient not taking: Reported on 08/05/2022) 180 mL 0   No facility-administered medications prior to visit.     Objective:     BP 130/74 (BP Location: Left Arm, Patient Position: Sitting)   Pulse 76   Ht '5\' 3"'$  (1.6 m)   Wt 183 lb 6.4 oz (83.2 kg)   SpO2 97% Comment: ra  BMI 32.49 kg/m   SpO2: 97 % (ra)  Amb mod obese pleasant bf chewing mint gum/ mild pseudowheeze    HEENT : Oropharynx  clear     Nasal turbinates nl    NECK :  without  apparent JVD/ palpable Nodes/TM    LUNGS: no acc muscle use,  Nl contour chest which is clear to A and P bilaterally without cough on insp or exp maneuvers   CV:  RRR  no s3 or murmur or increase in P2, and no edema   ABD:  soft and nontender with nl inspiratory excursion in the supine position. No bruits or organomegaly appreciated   MS:  Nl gait/ ext warm without deformities Or obvious joint restrictions  calf tenderness, cyanosis or clubbing    SKIN: warm and dry without lesions    NEURO:  alert, approp, nl sensorium with  no motor or cerebellar deficits apparent.       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[employer], product/process development around 2020, worse since quit   DDX of  difficult airways management almost all start with A and  include Adherence, Ace Inhibitors, Acid Reflux, Active Sinus Disease, Alpha 1 Antitripsin deficiency, Anxiety masquerading as Airways dz,  ABPA,  Allergy(esp in young), Aspiration (esp in elderly), Adverse effects of meds,  Active smoking or vaping, A  bunch of PE's (a small clot burden can't cause this syndrome unless there is already severe underlying pulm or vascular dz with poor reserve) plus two Bs  = Bronchiectasis and Beta blocker use..and one C= CHF   Adherence is always the initial "prime suspect" and is a multilayered concern that requires a "trust but verify" approach in every patient - starting with knowing how to use medications, especially inhalers, correctly, keeping up with refills and understanding the fundamental difference between maintenance and prns vs those medications only taken for a very short course and then stopped and not refilled.  - - The proper method of use, as well as anticipated side effects, of a metered-dose inhaler were discussed and demonstrated  to the patient using teach back method.   - return Please schedule a follow up office visit in 6 weeks, call sooner if needed   ACEi adverse effects at the  top of the usual list of suspects and the only way to rule it out is a trial off > see a/p    ? Acid (or non-acid) GERD > always difficult to exclude as up to 75% of pts in some series report no assoc GI/ Heartburn symptoms> rec max (24h)  acid suppression and diet restrictions/ reviewed and instructions given in writing. (Stop mints)  ? Allergy > check allergy screen and continue singulair symbicort 80 2bid for now but work on technique   ? A bunch of PEs > check d dimer   ? CHF > check bnp   Severe persistent asthma Onset ? Around 202 - Allergy screen 08/05/2022 >  Eos 0. /  IgE   - hfa teaching 08/05/2022 >>>  - FENO 08/05/2022  =  35  on symb 80 though not able to confirm hfa technique   See doe/ same ddx > f/u in 6 weeks with inhalers to confirm technique      Each maintenance medication was reviewed in detail including emphasizing most importantly the difference between maintenance and prns and under what circumstances the prns are to be triggered using an action plan format where appropriate.  Total  time for H and P, chart review, counseling, reviewing hfa device(s) and generating customized AVS unique to this office visit / same day charting = 38 min with pt new to me          Christinia Gully, MD 08/05/2022

## 2022-08-05 NOTE — Assessment & Plan Note (Addendum)
Onset ? Around 202 - Allergy screen 08/05/2022 >  Eos 0. /  IgE   - hfa teaching 08/05/2022 >>>  - FENO 08/05/2022  =  35  on symb 80 though not able to confirm hfa technique   See doe/ same ddx > f/u in 6 weeks with inhalers to confirm technique      Each maintenance medication was reviewed in detail including emphasizing most importantly the difference between maintenance and prns and under what circumstances the prns are to be triggered using an action plan format where appropriate.  Total time for H and P, chart review, counseling, reviewing hfa device(s) and generating customized AVS unique to this office visit / same day charting = 38 min with pt new to me

## 2022-08-05 NOTE — Patient Instructions (Addendum)
Stop benazepril and start valsartan 160/12.5   Pantoprazole (protonix) 40 mg   Take  30-60 min before first meal of the day and Pepcid (famotidine)  20 mg after supper until return to office - this is the best way to tell whether stomach acid is contributing to your problem.     GERD (REFLUX)  is an extremely common cause of respiratory symptoms just like yours , many times with no obvious heartburn at all.    It can be treated with medication, but also with lifestyle changes including elevation of the head of your bed (ideally with 6 -8inch blocks under the headboard of your bed),  Smoking cessation, avoidance of late meals, excessive alcohol, and avoid fatty foods, chocolate, peppermint, colas, red wine, and acidic juices such as orange juice.  NO MINT OR MENTHOL PRODUCTS SO NO COUGH DROPS  USE SUGARLESS CANDY INSTEAD (Jolley ranchers or Stover's or Life Savers) or even ice chips will also do - the key is to swallow to prevent all throat clearing. NO OIL BASED VITAMINS - use powdered substitutes.  Avoid fish oil when coughing.   Please remember to go to the lab department   for your tests - we will call you with the results when they are available.     Work on inhaler technique:  relax and gently blow all the way out then take a nice smooth full deep breath back in, triggering the inhaler at same time you start breathing in.  Hold breath in for at least  5 seconds if you can. Blow out symbicort  thru nose. Rinse and gargle with water when done.  If mouth or throat bother you at all,  try brushing teeth/gums/tongue with arm and hammer toothpaste/ make a slurry and gargle and spit out.      Please schedule a follow up office visit in 6 weeks, call sooner if needed with all medications /inhalers/ solutions in hand so we can verify exactly what you are taking. This includes all medications from all doctors and over the counters

## 2022-08-05 NOTE — Assessment & Plan Note (Signed)
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[employer], product/process development around 2020, worse since quit   DDX of  difficult airways management almost all start with A and  include Adherence, Ace Inhibitors, Acid Reflux, Active Sinus Disease, Alpha 1 Antitripsin deficiency, Anxiety masquerading as Airways dz,  ABPA,  Allergy(esp in young), Aspiration (esp in elderly), Adverse effects of meds,  Active smoking or vaping, A bunch of PE's (a small clot burden can't cause this syndrome unless there is already severe underlying pulm or vascular dz with poor reserve) plus two Bs  = Bronchiectasis and Beta blocker use..and one C= CHF   Adherence is always the initial "prime suspect" and is a multilayered concern that requires a "trust but verify" approach in every patient - starting with knowing how to use medications, especially inhalers, correctly, keeping up with refills and understanding the fundamental difference between maintenance and prns vs those medications only taken for a very short course and then stopped and not refilled.  - - The proper method of use, as well as anticipated side effects, of a metered-dose inhaler were discussed and demonstrated to the patient using teach back method.   - return Please schedule a follow up office visit in 6 weeks, call sooner if needed   ACEi adverse effects at the  top of the usual list of suspects and the only way to rule it out is a trial off > see a/p    ? Acid (or non-acid) GERD > always difficult to exclude as up to 75% of pts in some series report no assoc GI/ Heartburn symptoms> rec max (24h)  acid suppression and diet restrictions/ reviewed and instructions given in writing. (Stop mints)  ? Allergy > check allergy screen and continue singulair symbicort 80 2bid for now but work on technique   ? A bunch of PEs > check d dimer   ? CHF > check bnp

## 2022-08-26 ENCOUNTER — Other Ambulatory Visit: Payer: Self-pay | Admitting: Family Medicine

## 2022-08-29 ENCOUNTER — Other Ambulatory Visit: Payer: Self-pay | Admitting: Family Medicine

## 2022-08-30 ENCOUNTER — Other Ambulatory Visit: Payer: Self-pay

## 2022-08-30 MED ORDER — BENAZEPRIL-HYDROCHLOROTHIAZIDE 20-12.5 MG PO TABS: ORAL_TABLET | ORAL | 2 refills | Status: DC

## 2022-09-08 ENCOUNTER — Other Ambulatory Visit: Payer: Self-pay | Admitting: Family Medicine

## 2022-09-09 ENCOUNTER — Ambulatory Visit (INDEPENDENT_AMBULATORY_CARE_PROVIDER_SITE_OTHER): Payer: Medicare HMO | Admitting: Family Medicine

## 2022-09-09 ENCOUNTER — Encounter: Payer: Self-pay | Admitting: Family Medicine

## 2022-09-09 VITALS — BP 123/82 | HR 71 | Ht 63.0 in | Wt 181.1 lb

## 2022-09-09 DIAGNOSIS — E1159 Type 2 diabetes mellitus with other circulatory complications: Secondary | ICD-10-CM

## 2022-09-09 DIAGNOSIS — E669 Obesity, unspecified: Secondary | ICD-10-CM | POA: Diagnosis not present

## 2022-09-09 DIAGNOSIS — J4550 Severe persistent asthma, uncomplicated: Secondary | ICD-10-CM | POA: Diagnosis not present

## 2022-09-09 DIAGNOSIS — I10 Essential (primary) hypertension: Secondary | ICD-10-CM

## 2022-09-09 MED ORDER — BLOOD GLUCOSE MONITORING SUPPL DEVI
1.0000 | Freq: Three times a day (TID) | 0 refills | Status: AC
Start: 2022-09-09 — End: ?

## 2022-09-09 MED ORDER — BLOOD GLUCOSE TEST VI STRP
1.0000 | ORAL_STRIP | Freq: Three times a day (TID) | 0 refills | Status: AC
Start: 2022-09-09 — End: 2022-10-12

## 2022-09-09 MED ORDER — LANCETS MISC. MISC
1.0000 | Freq: Three times a day (TID) | 0 refills | Status: AC
Start: 2022-09-09 — End: 2022-10-09

## 2022-09-09 MED ORDER — LANCET DEVICE MISC: 1.0000 | Freq: Three times a day (TID) | 0 refills | Status: AC

## 2022-09-09 MED ORDER — VALSARTAN-HYDROCHLOROTHIAZIDE 160-12.5 MG PO TABS: 1.0000 | ORAL_TABLET | Freq: Every day | ORAL | 11 refills | Status: DC

## 2022-09-09 NOTE — Progress Notes (Signed)
Daisy Robbins     MRN: [id number]      DOB: [birth year]   HPI Daisy Robbins is here for follow up and re-evaluation of chronic medical conditions, medication management and review of any available recent lab and radiology data.  Preventive health is updated, specifically  Cancer screening and Immunization.   States confused regarding recommendations of the Pulmonary Doc, did not fill valsartan, and does not understand what the recommendations were, has appt next week to address, states wheezing has improved, does not have maintainace inhaler as not covered, fels overwhelemed , wants to " dothe right thing " for her health and tankful for help The PT denies any adverse reactions to current medications since the last visit.  Needs to complete form which unable to locate to follow through with needed colonoscopy, same reprinted and provided at visit Not testing blood sugar Denies polyuria, polydipsia, blurred vision , or hypoglycemic episodes.  ROS Denies recent fever or chills. Denies sinus pressure, nasal congestion, ear pain or sore throat. Denies chest congestion, productive cough or wheezing. Denies chest pains, palpitations and leg swelling Denies abdominal pain, nausea, vomiting,diarrhea or constipation.   Denies dysuria, frequency, hesitancy or incontinence. Denies joint pain, swelling and limitation in mobility. Denies headaches, seizures, numbness, or tingling.  Denies skin break down or rash.   PE  BP 123/82 (BP Location: Left Arm, Patient Position: Sitting, Cuff Size: Large)   Pulse 71   Ht 5\' 3"  (1.6 m)   Wt 181 lb 1.9 oz (82.2 kg)   SpO2 95%   BMI 32.08 kg/m   Patient alert and oriented and in no cardiopulmonary distress.  HEENT: No facial asymmetry, EOMI,     Neck supple .  Chest: Clear to auscultation bilaterally.  CVS: S1, S2 no murmurs, no S3.Regular rate.  ABD: Soft non tender.   Ext: No edema  MS: Adequate ROM spine, shoulders, hips and knees.  Skin:  Intact, no ulcerations or rash noted.  Psych: Good eye contact, normal affect. Memory intact not anxious or depressed appearing.  CNS: CN 2-12 intact, power,  normal throughout.no focal deficits noted.   Assessment & Plan  Essential hypertension, benign DASH diet and commitment to daily physical activity for a minimum of 30 minutes discussed and encouraged, as a part of hypertension management. The importance of attaining a healthy weight is also discussed.     09/09/2022    9:55 AM 09/09/2022    9:54 AM 08/05/2022    9:16 AM 06/10/2022    9:16 AM 06/10/2022    8:30 AM 05/04/2022    8:26 AM 11/25/2021   11:25 AM  BP/Weight  Systolic BP 123 145 130 128 132 131 143  Diastolic BP 82 82 74 84 87 64 82  Wt. (Lbs)  181.12 183.4  179.12 179 179.12  BMI  32.08 kg/m2 32.49 kg/m2  31.73 kg/m2 31.71 kg/m2 31.73 kg/m2     Controlled but needs to chang e from ACR to RB , new rx sent to local pharmacy with clarification as to the why, will see Pulmonary on next 1 to 2 weeks  Type 2 diabetes mellitus with vascular disease (HCC) Daisy Robbins is reminded of the importance of commitment to daily physical activity for 30 minutes or more, as able and the need to limit carbohydrate intake to 30 to 60 grams per meal to help with blood sugar control.   The need to take medication as prescribed, test blood sugar as directed, and to call  between visits if there is a concern that blood sugar is uncontrolled is also discussed.   Daisy Robbins is reminded of the importance of daily foot exam, annual eye examination, and good blood sugar, blood pressure and cholesterol control.     Latest Ref Rng & Units 06/10/2022    9:30 AM 11/25/2021   12:06 PM 06/30/2021    8:28 AM 09/18/2020    9:10 AM 12/02/2019    3:46 PM  Diabetic Labs  HbA1c 4.8 - 5.6 % 8.1  6.8  7.3  7.0    Micro/Creat Ratio 0 - 29 mg/g creat <5       Chol 100 - 199 mg/dL 409182   811151  914177  782200   HDL >39 mg/dL 89   78  76  90   Calc LDL 0 - 99 mg/dL 79   57  89   96   Triglycerides 0 - 149 mg/dL 75   86  62  81   Creatinine 0.57 - 1.00 mg/dL 9.560.87   2.130.81  0.860.95  5.780.80       09/09/2022    9:55 AM 09/09/2022    9:54 AM 08/05/2022    9:16 AM 06/10/2022    9:16 AM 06/10/2022    8:30 AM 05/04/2022    8:26 AM 11/25/2021   11:25 AM  BP/Weight  Systolic BP 123 145 130 128 132 131 143  Diastolic BP 82 82 74 84 87 64 82  Wt. (Lbs)  181.12 183.4  179.12 179 179.12  BMI  32.08 kg/m2 32.49 kg/m2  31.73 kg/m2 31.71 kg/m2 31.73 kg/m2      Latest Ref Rng & Units 06/10/2022    8:20 AM 07/22/2020   12:00 AM  Foot/eye exam completion dates  Eye Exam No Retinopathy  No Retinopathy      Foot Form Completion  Done      This result is from an external source.      Updated lab needed .   Hypercalcemia Update lab and check PTH level, if both elevated will refer to Endo  Severe persistent asthma Reports inability to afford maintenance inhaler prescribed by Pulmonary and actually came to visit reporting she was told she does not have asthma, confused and will discuus and review recs with MD at f/u,  also advised I will send a note to make him aware of her confusion, tho she does want tohave her ehalth improved and will follow directions once understood

## 2022-09-09 NOTE — Assessment & Plan Note (Signed)
Update lab and check PTH level, if both elevated will refer to Endo

## 2022-09-09 NOTE — Assessment & Plan Note (Signed)
DASH diet and commitment to daily physical activity for a minimum of 30 minutes discussed and encouraged, as a part of hypertension management. The importance of attaining a healthy weight is also discussed.     09/09/2022    9:55 AM 09/09/2022    9:54 AM 08/05/2022    9:16 AM 06/10/2022    9:16 AM 06/10/2022    8:30 AM 05/04/2022    8:26 AM 11/25/2021   11:25 AM  BP/Weight  Systolic BP 123 145 130 128 132 131 143  Diastolic BP 82 82 74 84 87 64 82  Wt. (Lbs)  181.12 183.4  179.12 179 179.12  BMI  32.08 kg/m2 32.49 kg/m2  31.73 kg/m2 31.71 kg/m2 31.73 kg/m2     Controlled but needs to chang e from ACR to RB , new rx sent to local pharmacy with clarification as to the why, will see Pulmonary on next 1 to 2 weeks

## 2022-09-09 NOTE — Patient Instructions (Addendum)
Follow-up in 6 to 8 weeks re-evaluate blood pressure and blood sugar, call if you need me sooner.  Starting tomorrow please change to new blood pressure medication Valsartan/hCTZ one daily, this is prescribed at your pharmacy , this will help to reduce cough stop taking benazepril HCTZ.  Nurse please prescribe meter and once daily testing supplies, since blood sugar avg has been generally over 7 , she needs to test morning blood sugar, goal for this is 80 to 120, please review this info with her before sh leaves  Labs today HbA1c, CMP and EGFR, PTH level.please respond to my chart message  Nurse please print today letter sent from GI regarding the need to complete a questionnaire so that she can have her colonoscopy and give this to her so that she can follow through on the referral.  The 2 shingles vaccines as well as a tetanus vaccine recommended also the RSV vaccine.  All of these are at your pharmacy and you can get them there.  Thanks for choosing [facility], we consider it a privelige to serve you.

## 2022-09-09 NOTE — Assessment & Plan Note (Signed)
Daisy Robbins is reminded of the importance of commitment to daily physical activity for 30 minutes or more, as able and the need to limit carbohydrate intake to 30 to 60 grams per meal to help with blood sugar control.   The need to take medication as prescribed, test blood sugar as directed, and to call between visits if there is a concern that blood sugar is uncontrolled is also discussed.   Daisy Robbins is reminded of the importance of daily foot exam, annual eye examination, and good blood sugar, blood pressure and cholesterol control.     Latest Ref Rng & Units 06/10/2022    9:30 AM 11/25/2021   12:06 PM 06/30/2021    8:28 AM 09/18/2020    9:10 AM 12/02/2019    3:46 PM  Diabetic Labs  HbA1c 4.8 - 5.6 % 8.1  6.8  7.3  7.0    Micro/Creat Ratio 0 - 29 mg/g creat <5       Chol 100 - 199 mg/dL 233   612  244  975   HDL >39 mg/dL 89   78  76  90   Calc LDL 0 - 99 mg/dL 79   57  89  96   Triglycerides 0 - 149 mg/dL 75   86  62  81   Creatinine 0.57 - 1.00 mg/dL 3.00   5.11  0.21  1.17       09/09/2022    9:55 AM 09/09/2022    9:54 AM 08/05/2022    9:16 AM 06/10/2022    9:16 AM 06/10/2022    8:30 AM 05/04/2022    8:26 AM 11/25/2021   11:25 AM  BP/Weight  Systolic BP 123 145 130 128 132 131 143  Diastolic BP 82 82 74 84 87 64 82  Wt. (Lbs)  181.12 183.4  179.12 179 179.12  BMI  32.08 kg/m2 32.49 kg/m2  31.73 kg/m2 31.71 kg/m2 31.73 kg/m2      Latest Ref Rng & Units 06/10/2022    8:20 AM 07/22/2020   12:00 AM  Foot/eye exam completion dates  Eye Exam No Retinopathy  No Retinopathy      Foot Form Completion  Done      This result is from an external source.      Updated lab needed .

## 2022-09-10 NOTE — Assessment & Plan Note (Signed)
Reports inability to afford maintenance inhaler prescribed by Pulmonary and actually came to visit reporting she was told she does not have asthma, confused and will discuus and review recs with MD at f/u,  also advised I will send a note to make him aware of her confusion, tho she does want tohave her ehalth improved and will follow directions once understood

## 2022-09-11 LAB — CMP14+EGFR
ALT: 23 IU/L (ref 0–32)
AST: 24 IU/L (ref 0–40)
Albumin/Globulin Ratio: 1.5 (ref 1.2–2.2)
Albumin: 4.3 g/dL (ref 3.9–4.9)
Alkaline Phosphatase: 113 IU/L (ref 44–121)
BUN/Creatinine Ratio: 10 — ABNORMAL LOW (ref 12–28)
BUN: 9 mg/dL (ref 8–27)
Bilirubin Total: 0.3 mg/dL (ref 0.0–1.2)
CO2: 27 mmol/L (ref 20–29)
Calcium: 10.4 mg/dL — ABNORMAL HIGH (ref 8.7–10.3)
Chloride: 101 mmol/L (ref 96–106)
Creatinine, Ser: 0.9 mg/dL (ref 0.57–1.00)
Globulin, Total: 2.9 g/dL (ref 1.5–4.5)
Glucose: 98 mg/dL (ref 70–99)
Potassium: 4 mmol/L (ref 3.5–5.2)
Sodium: 144 mmol/L (ref 134–144)
Total Protein: 7.2 g/dL (ref 6.0–8.5)
eGFR: 72 mL/min/{1.73_m2} (ref >59)

## 2022-09-11 LAB — HEMOGLOBIN A1C
Est. average glucose Bld gHb Est-mCnc: 157 mg/dL
Hgb A1c MFr Bld: 7.1 % — ABNORMAL HIGH (ref 4.8–5.6)

## 2022-09-11 LAB — PARATHYROID HORMONE, INTACT (NO CA): PTH: 22 pg/mL (ref 15–65)

## 2022-09-14 NOTE — Progress Notes (Deleted)
Daisy Robbins, female    DOB: 02-Mar-1961    MRN: [id number]   Brief patient profile:  3  yobf  never smoker  referred to pulmonary clinic in [place]  08/05/2022 by Dr Lodema Hong for ? Asthma onset around 2020 with sob and cough / subjective wheeze      History of Present Illness  08/05/2022  Pulmonary/ 1st office eval/ Daisy Robbins / [place] [facility] on lisinopril  Chief Complaint  Patient presents with   Consult    Asthma/ cough but better now   Dyspnea:  no longer ex afraid the wheezing will come back  Cough: comes and goes with breathing / worse with cold air exposure Sleep: no  resp cc flat be one puffy pillow  SABA use: hasn't needed x several weeks   02 none   Rec Stop benazepril and start valsartan 160/12.5  Pantoprazole (protonix) 40 mg   Take  30-60 min before first meal of the day and Pepcid (famotidine)  20 mg after supper until return to office  GERD diet reviewed, bed blocks rec   Please remember to go to the lab department  > did not go to lab Work on inhaler technique:       Please schedule a follow up office visit in 6 weeks, call sooner if needed with all medications /inhalers/ solutions in hand      09/16/2022  f/u ov/ office/Daisy Robbins re: *** maint on *** - ? Needs labs if not better? *** No chief complaint on file.   Dyspnea:  *** Cough: *** Sleeping: *** SABA use: *** 02: *** Covid status: *** Lung cancer screening: ***   No obvious day to day or daytime variability or assoc excess/ purulent sputum or mucus plugs or hemoptysis or cp or chest tightness, subjective wheeze or overt sinus or hb symptoms.   *** without nocturnal  or early am exacerbation  of respiratory  c/o's or need for noct saba. Also denies any obvious fluctuation of symptoms with weather or environmental changes or other aggravating or alleviating factors except as outlined above   No unusual exposure hx or h/o childhood pna/ asthma or knowledge of premature birth.  Current  Allergies, Complete Past Medical History, Past Surgical History, Family History, and Social History were reviewed in [employer] record.  ROS  The following are not active complaints unless bolded Hoarseness, sore throat, dysphagia, dental problems, itching, sneezing,  nasal congestion or discharge of excess mucus or purulent secretions, ear ache,   fever, chills, sweats, unintended wt loss or wt gain, classically pleuritic or exertional cp,  orthopnea pnd or arm/hand swelling  or leg swelling, presyncope, palpitations, abdominal pain, anorexia, nausea, vomiting, diarrhea  or change in bowel habits or change in bladder habits, change in stools or change in urine, dysuria, hematuria,  rash, arthralgias, visual complaints, headache, numbness, weakness or ataxia or problems with walking or coordination,  change in mood or  memory.        No outpatient medications have been marked as taking for the 09/16/22 encounter (Appointment) with [name], MD.           Past Medical History:  Diagnosis Date   Allergy    Diabetes mellitus    Hypertension        Objective:    Wt Readings from Last 3 Encounters:  09/09/22 181 lb 1.9 oz (82.2 kg)  08/05/22 183 lb 6.4 oz (83.2 kg)  06/10/22 179 lb 1.9 oz (81.2 kg)  Vital signs reviewed  09/16/2022  - Note at rest 02 sats  ***% on ***   General appearance:    ***        Assessment

## 2022-09-16 ENCOUNTER — Ambulatory Visit: Payer: Medicare HMO | Admitting: Internal Medicine

## 2022-09-28 ENCOUNTER — Other Ambulatory Visit: Payer: Self-pay | Admitting: Family Medicine

## 2022-10-11 NOTE — Progress Notes (Unsigned)
Daisy Robbins, female    DOB: Aug 12, 1960    MRN: [id number]   Brief patient profile:  30  yobf never smoker  referred to pulmonary clinic in [place]  08/05/2022 by Dr Lodema Hong for ? Asthma onset around 2020 with sob and cough / subjective wheeze     History of Present Illness  08/05/2022  Pulmonary/ 1st office eval/ Daisy Robbins / [place] [facility] on lisinopril  Chief Complaint  Patient presents with   Consult    Asthma/ cough but better now   Dyspnea:  no longer ex afraid the wheezing will come back  Cough: comes and goes with breathing / worse with cold air exposure Sleep: no  resp cc flat be one puffy pillow  SABA use: hasn't needed x several weeks   02 none   Rec Stop benazepril and start valsartan 160/12.5  Pantoprazole (protonix) 40 mg   Take  30-60 min before first meal of the day and Pepcid (famotidine)  20 mg after supper until return to office  GERD diet reviewed, bed blocks rec  Please remember to go to the lab department  > did not go to lab Work on inhaler technique:       Please schedule a follow up office visit in 6 weeks, call sooner if needed with all medications /inhalers/ solutions in hand      10/12/2022  f/u ov/Daisy Robbins office/Daisy Robbins re: asthma vs pseudoasthma  maint on prn saba    did  bring meds  Chief Complaint  Patient presents with   Follow-up    Pt f/u states that she has stopped taking the Symbicort and is starting to have her breathing issues again  Dyspnea:  very sedentary  Cough: dry day > noct  SABA use: increased since stopped symbicort 80 ? Why she stopped as that was not a rec 02: none      No obvious day to day or daytime variability or assoc excess/ purulent sputum or mucus plugs or hemoptysis or cp or chest tightness, subjective wheeze or overt sinus or hb symptoms.     Also denies any obvious fluctuation of symptoms with weather or environmental changes or other aggravating or alleviating factors except as outlined above   No unusual  exposure hx or h/o childhood pna/ asthma or knowledge of premature birth.  Current Allergies, Complete Past Medical History, Past Surgical History, Family History, and Social History were reviewed in [employer] record.  ROS  The following are not active complaints unless bolded Hoarseness, sore throat= globus, dysphagia, dental problems, itching, sneezing,  nasal congestion or discharge of excess mucus or purulent secretions, ear ache,   fever, chills, sweats, unintended wt loss or wt gain, classically pleuritic or exertional cp,  orthopnea pnd or arm/hand swelling  or leg swelling, presyncope, palpitations, abdominal pain, anorexia, nausea, vomiting, diarrhea  or change in bowel habits or change in bladder habits, change in stools or change in urine, dysuria, hematuria,  rash, arthralgias, visual complaints, headache, numbness, weakness or ataxia or problems with walking or coordination,  change in mood or  memory.        Current Meds  Medication Sig   albuterol (VENTOLIN HFA) 108 (90 Base) MCG/ACT inhaler Inhale 2 puffs into the lungs every 6 (six) hours as needed for wheezing or shortness of breath.   Blood Glucose Monitoring Suppl DEVI 1 each by Does not apply route in the morning, at noon, and at bedtime. May substitute to any manufacturer covered by  patient's insurance.   calcium-vitamin D (OSCAL WITH D) 500-5 MG-MCG tablet Take 1 tablet by mouth 2 (two) times daily.   glipiZIDE (GLUCOTROL XL) 2.5 MG 24 hr tablet TAKE 1 TABLET BY MOUTH DAILY WITH BREAKFAST.   [EXPIRED] Glucose Blood (BLOOD GLUCOSE TEST STRIPS) STRP 1 each by In Vitro route in the morning, at noon, and at bedtime. May substitute to any manufacturer covered by patient's insurance.   loratadine (CLARITIN) 10 MG tablet TAKE 1 TABLET BY MOUTH EVERY DAY   montelukast (SINGULAIR) 10 MG tablet TAKE 1 TABLET AT BEDTIME   rosuvastatin (CRESTOR) 5 MG tablet TAKE ONE TABLET BY MOUTH EVERY MONDAY AND THURSDAY    SitaGLIPtin-MetFORMIN HCl (JANUMET XR) 50-1000 MG TB24 TAKE 2 TABLETS EVERY DAY   valsartan-hydrochlorothiazide (DIOVAN HCT) 160-12.5 MG tablet Two daily   [DISCONTINUED] benazepril-hydrochlorthiazide (LOTENSIN HCT) 20-12.5 MG tablet Take 2 tablets by mouth daily.           Past Medical History:  Diagnosis Date   Allergy    Diabetes mellitus    Hypertension        Objective:    Wt Readings from Last 3 Encounters:  10/12/22 180 lb (81.6 kg)  09/09/22 181 lb 1.9 oz (82.2 kg)  08/05/22 183 lb 6.4 oz (83.2 kg)     Vital signs reviewed  10/12/2022  - Note at rest 02 sats  96% on RA   General appearance:    amb bf classic pseudowheeze      HEENT : Oropharynx  clea    NECK :  without  apparent JVD/ palpable Nodes/TM    LUNGS: no acc muscle use,  Nl contour chest which is clear to A and P bilaterally without cough on insp or exp maneuvers   CV:  RRR  no s3 or murmur or increase in P2, and no edema   ABD:  soft and nontender with nl inspiratory excursion in the supine position. No bruits or organomegaly appreciated   MS:  Nl gait/ ext warm without deformities Or obvious joint restrictions  calf tenderness, cyanosis or clubbing    SKIN: warm and dry without lesions    NEURO:  alert, approp, nl sensorium with  no motor or cerebellar deficits apparent.       Assessment     Outpatient Encounter Medications as of 10/12/2022  Medication Sig   albuterol (VENTOLIN HFA) 108 (90 Base) MCG/ACT inhaler Inhale 2 puffs into the lungs every 6 (six) hours as needed for wheezing or shortness of breath.   Blood Glucose Monitoring Suppl DEVI 1 each by Does not apply route in the morning, at noon, and at bedtime. May substitute to any manufacturer covered by patient's insurance.   calcium-vitamin D (OSCAL WITH D) 500-5 MG-MCG tablet Take 1 tablet by mouth 2 (two) times daily.   glipiZIDE (GLUCOTROL XL) 2.5 MG 24 hr tablet TAKE 1 TABLET BY MOUTH DAILY WITH BREAKFAST.   [EXPIRED] Glucose  Blood (BLOOD GLUCOSE TEST STRIPS) STRP 1 each by In Vitro route in the morning, at noon, and at bedtime. May substitute to any manufacturer covered by patient's insurance.   loratadine (CLARITIN) 10 MG tablet TAKE 1 TABLET BY MOUTH EVERY DAY   montelukast (SINGULAIR) 10 MG tablet TAKE 1 TABLET AT BEDTIME   rosuvastatin (CRESTOR) 5 MG tablet TAKE ONE TABLET BY MOUTH EVERY MONDAY AND THURSDAY   SitaGLIPtin-MetFORMIN HCl (JANUMET XR) 50-1000 MG TB24 TAKE 2 TABLETS EVERY DAY   valsartan-hydrochlorothiazide (DIOVAN HCT) 160-12.5 MG tablet Two daily   [  DISCONTINUED] benazepril-hydrochlorthiazide (LOTENSIN HCT) 20-12.5 MG tablet Take 2 tablets by mouth daily.   budesonide-formoterol (SYMBICORT) 80-4.5 MCG/ACT inhaler Inhale 2 puffs into the lungs 2 (two) times daily.   famotidine (PEPCID) 20 MG tablet One after supper   latanoprost (XALATAN) 0.005 % ophthalmic solution 1 drop at bedtime.   pantoprazole (PROTONIX) 40 MG tablet Take 1 tablet (40 mg total) by mouth daily. Take 30-60 min before first meal of the day   PROAIR HFA 108 (90 Base) MCG/ACT inhaler Inhale 2 puffs into the lungs every 6 (six) hours as needed for wheezing or shortness of breath.   [DISCONTINUED] albuterol (PROVENTIL) (2.5 MG/3ML) 0.083% nebulizer solution Take 3 mLs (2.5 mg total) by nebulization every 6 (six) hours as needed for wheezing or shortness of breath. (Patient not taking: Reported on 10/12/2022)   [DISCONTINUED] budesonide-formoterol (SYMBICORT) 80-4.5 MCG/ACT inhaler Inhale 2 puffs into the lungs 2 (two) times daily. (Patient not taking: Reported on 10/12/2022)   [DISCONTINUED] famotidine (PEPCID) 20 MG tablet One after supper (Patient not taking: Reported on 10/12/2022)   [DISCONTINUED] ipratropium (ATROVENT) 0.02 % nebulizer solution Take 2.5 mLs (0.5 mg total) by nebulization 4 (four) times daily. (Patient not taking: Reported on 10/12/2022)   [DISCONTINUED] pantoprazole (PROTONIX) 40 MG tablet Take 1 tablet (40 mg total) by  mouth daily. Take 30-60 min before first meal of the day (Patient not taking: Reported on 10/12/2022)   [DISCONTINUED] valsartan-hydrochlorothiazide (DIOVAN HCT) 160-12.5 MG tablet Take 1 tablet by mouth daily.   No facility-administered encounter medications on file as of 10/12/2022.

## 2022-10-12 ENCOUNTER — Encounter: Payer: Self-pay | Admitting: Internal Medicine

## 2022-10-12 ENCOUNTER — Ambulatory Visit: Payer: Medicare HMO | Admitting: Internal Medicine

## 2022-10-12 VITALS — BP 125/77 | HR 75 | Ht 63.0 in | Wt 180.0 lb

## 2022-10-12 DIAGNOSIS — I10 Essential (primary) hypertension: Secondary | ICD-10-CM | POA: Diagnosis not present

## 2022-10-12 DIAGNOSIS — J4550 Severe persistent asthma, uncomplicated: Secondary | ICD-10-CM | POA: Diagnosis not present

## 2022-10-12 MED ORDER — VALSARTAN-HYDROCHLOROTHIAZIDE 160-12.5 MG PO TABS: ORAL_TABLET | ORAL | Status: DC

## 2022-10-12 MED ORDER — PANTOPRAZOLE SODIUM 40 MG PO TBEC: 40.0000 mg | DELAYED_RELEASE_TABLET | Freq: Every day | ORAL | 2 refills | Status: DC

## 2022-10-12 MED ORDER — BUDESONIDE-FORMOTEROL FUMARATE 80-4.5 MCG/ACT IN AERO: 2.0000 | INHALATION_SPRAY | Freq: Two times a day (BID) | RESPIRATORY_TRACT | 3 refills | Status: DC

## 2022-10-12 MED ORDER — FAMOTIDINE 20 MG PO TABS: ORAL_TABLET | ORAL | 11 refills | Status: DC

## 2022-10-12 NOTE — Patient Instructions (Addendum)
Restart Symbicort 80 Take 2 puffs first thing in am and then another 2 puffs about 12 hours later.   Work on inhaler technique:  relax and gently blow all the way out then take a nice smooth full deep breath back in, triggering the inhaler at same time you start breathing in.  Hold breath in for at least  5 seconds if you can. Blow out symbicort 80  thru nose. Rinse and gargle with water when done.  If mouth or throat bother you at all,  try brushing teeth/gums/tongue with arm and hammer toothpaste/ make a slurry and gargle and spit out.    Only use your albuterol as a rescue medication to be used if you can't catch your breath by resting or doing a relaxed purse lip breathing pattern.  - The less you use it, the better it will work when you need it. - Ok to use up to 2 puffs  every 4 hours if you must but call for immediate appointment if use goes up over your usual need - Don't leave home without it !!  (think of it like the spare tire for your car)    Stop benazepril and start valsartan 160/12.5  2 daily   Pantoprazole (protonix) 40 mg   Take  30-60 min before first meal of the day and Pepcid (famotidine)  20 mg after supper until return to office - this is the best way to tell whether stomach acid is contributing to your problem.     GERD (REFLUX)  is an extremely common cause of respiratory symptoms just like yours , many times with no obvious heartburn at all.    It can be treated with medication, but also with lifestyle changes including elevation of the head of your bed (ideally with 6 -8inch blocks under the headboard of your bed),  Smoking cessation, avoidance of late meals, excessive alcohol, and avoid fatty foods, chocolate, peppermint, colas, red wine, and acidic juices such as orange juice.  NO MINT OR MENTHOL PRODUCTS SO NO COUGH DROPS  USE SUGARLESS CANDY INSTEAD (Jolley ranchers or Stover's or Life Savers) or even ice chips will also do - the key is to swallow to prevent all throat  clearing. NO OIL BASED VITAMINS - use powdered substitutes.  Avoid fish oil when coughing.    Work on inhaler technique:  relax and gently blow all the way out then take a nice smooth full deep breath back in, triggering the inhaler at same time you start breathing in.  Hold breath in for at least  5 seconds if you can. Blow out symbicort  thru nose. Rinse and gargle with water when done.  If mouth or throat bother you at all,  try brushing teeth/gums/tongue with arm and hammer toothpaste/ make a slurry and gargle and spit out.      Please schedule a follow up office visit in 6 weeks, call sooner if needed with all medications /inhalers/ solutions in hand so we can verify exactly what you are taking. This includes all medications from all doctors and over the counters

## 2022-10-13 ENCOUNTER — Encounter: Payer: Self-pay | Admitting: Internal Medicine

## 2022-10-13 NOTE — Assessment & Plan Note (Signed)
Onset ? Around 2020 - FENO 08/05/2022  =  35 on symb 80  - try off acei 10/12/18 - 10/12/2022  After extensive coaching inhaler device,  effectiveness =    75% > continue symbicort 80 2bid for now and approp saba

## 2022-10-13 NOTE — Assessment & Plan Note (Signed)
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   >>> try valsartan 160 /25 and f/u in 6 weeks           Each maintenance medication was reviewed in detail including emphasizing most importantly the difference between maintenance and prns and under what circumstances the prns are to be triggered using an action plan format where appropriate.  Total time for H and P, chart review, counseling, reviewing hfa  device(s) and generating customized AVS unique to this office visit / same day charting = 31 min

## 2022-10-18 ENCOUNTER — Telehealth: Payer: Self-pay | Admitting: Family Medicine

## 2022-10-18 NOTE — Telephone Encounter (Signed)
Pt called and stated that Valsartan/hCTZ  wasn't ready for pick up. Looked back at notes and seen dr wert increased medication. Can you please send appropriate medication in?

## 2022-10-18 NOTE — Telephone Encounter (Signed)
Patient came by the office and said she is still taking the old blood pressure medicine, she said the provider changed her to a new medication and the pharmacy does not have anything new.   Pharmacy: [facility]  Please call patient back let her know what blood pressure she is suppose to be taking. Call back # [phone]

## 2022-10-19 ENCOUNTER — Other Ambulatory Visit: Payer: Self-pay | Admitting: Family Medicine

## 2022-10-19 MED ORDER — VALSARTAN-HYDROCHLOROTHIAZIDE 160-12.5 MG PO TABS: ORAL_TABLET | ORAL | 2 refills | Status: DC

## 2022-10-20 NOTE — Telephone Encounter (Signed)
Spoke with patient about medications. 

## 2022-10-25 ENCOUNTER — Telehealth: Payer: Self-pay

## 2022-10-25 ENCOUNTER — Other Ambulatory Visit: Payer: Self-pay | Admitting: Family Medicine

## 2022-10-25 MED ORDER — VALSARTAN-HYDROCHLOROTHIAZIDE 320-25 MG PO TABS
1.0000 | ORAL_TABLET | Freq: Every day | ORAL | 5 refills | Status: DC
Start: 2022-10-25 — End: 2023-01-26

## 2022-10-25 NOTE — Telephone Encounter (Signed)
Patient aware.

## 2022-10-25 NOTE — Telephone Encounter (Signed)
[facility] he states that her insurance will not pay for 2 daily for the valsartan-hydrochlorothiazide 160-12.5 he is asking can you send in higher dose for once daily

## 2022-10-28 ENCOUNTER — Ambulatory Visit: Payer: Medicare HMO | Admitting: Family Medicine

## 2022-11-03 ENCOUNTER — Other Ambulatory Visit: Payer: Self-pay | Admitting: Internal Medicine

## 2022-11-29 ENCOUNTER — Ambulatory Visit: Payer: Medicare HMO | Admitting: Family Medicine

## 2022-12-04 NOTE — Progress Notes (Deleted)
Daisy Robbins, female    DOB: [birth year]    MRN: [id number]   Brief patient profile:  76  yobf never smoker  referred to pulmonary clinic in [place]  08/05/2022 by Dr Lodema Hong for ? Asthma onset around 2020 with sob and cough / subjective wheeze     History of Present Illness  08/05/2022  Pulmonary/ 1st office eval/ Daisy Robbins / [place] [facility] on lisinopril  Chief Complaint  Patient presents with   Consult    Asthma/ cough but better now   Dyspnea:  no longer ex afraid the wheezing will come back  Cough: comes and goes with breathing / worse with cold air exposure Sleep: no  resp cc flat be one puffy pillow  SABA use: hasn't needed x several weeks   02 none   Rec Stop benazepril and start valsartan 160/12.5  Pantoprazole (protonix) 40 mg   Take  30-60 min before first meal of the day and Pepcid (famotidine)  20 mg after supper until return to office  GERD diet reviewed, bed blocks rec  Please remember to go to the lab department  > did not go to lab Work on inhaler technique:       Please schedule a follow up office visit in 6 weeks, call sooner if needed with all medications /inhalers/ solutions in hand      10/12/2022  f/u ov/[place] office/Daisy Robbins re: asthma vs pseudoasthma  maint on prn saba    did  bring meds  Chief Complaint  Patient presents with   Follow-up    Pt f/u states that she has stopped taking the Symbicort and is starting to have her breathing issues again  Dyspnea:  very sedentary  Cough: dry day > noct  SABA use: increased since stopped symbicort 80 ? Why she stopped as that was not a rec 02: none   Rec Restart Symbicort 80 Take 2 puffs first thing in am and then another 2 puffs about 12 hours later.  Work on inhaler technique:    Only use your albuterol as a rescue medication   Stop benazepril and start valsartan 160/12.5  2 daily  Pantoprazole (protonix) 40 mg   Take  30-60 min before first meal of the day and Pepcid (famotidine)  20 mg after supper until  return to office  GERD diet reviewed, bed blocks rec   Work on inhaler technique:    Please schedule a follow up office visit in 6 weeks, call sooner if needed with all medications /inhalers/ solutions in hand    12/05/2022  f/u ov/[place] office/Daisy Robbins re: asthma vs pseudoasthma /acei case? maint on *** did *** bring meds  No chief complaint on file.   Dyspnea:  *** Cough: *** Sleeping: *** SABA use: *** 02: *** Covid status: *** Lung cancer screening: ***   No obvious day to day or daytime variability or assoc excess/ purulent sputum or mucus plugs or hemoptysis or cp or chest tightness, subjective wheeze or overt sinus or hb symptoms.   *** without nocturnal  or early am exacerbation  of respiratory  c/o's or need for noct saba. Also denies any obvious fluctuation of symptoms with weather or environmental changes or other aggravating or alleviating factors except as outlined above   No unusual exposure hx or h/o childhood pna/ asthma or knowledge of premature birth.  Current Allergies, Complete Past Medical History, Past Surgical History, Family History, and Social History were reviewed in [employer] record.  ROS  The following are not active complaints unless bolded Hoarseness, sore throat, dysphagia, dental problems, itching, sneezing,  nasal congestion or discharge of excess mucus or purulent secretions, ear ache,   fever, chills, sweats, unintended wt loss or wt gain, classically pleuritic or exertional cp,  orthopnea pnd or arm/hand swelling  or leg swelling, presyncope, palpitations, abdominal pain, anorexia, nausea, vomiting, diarrhea  or change in bowel habits or change in bladder habits, change in stools or change in urine, dysuria, hematuria,  rash, arthralgias, visual complaints, headache, numbness, weakness or ataxia or problems with walking or coordination,  change in mood or  memory.        No outpatient medications have been marked as taking for  the 12/05/22 encounter (Appointment) with Daisy Cowden, MD.         Past Medical History:  Diagnosis Date   Allergy    Diabetes mellitus    Hypertension        Objective:    Wts   12/05/2022          ***  10/12/22 180 lb (81.6 kg)  09/09/22 181 lb 1.9 oz (82.2 kg)  08/05/22 183 lb 6.4 oz (83.2 kg)    Vital signs reviewed  12/05/2022  - Note at rest 02 sats  ***% on ***   General appearance:    ***        Assessment

## 2022-12-05 ENCOUNTER — Ambulatory Visit: Payer: Medicare HMO | Admitting: Internal Medicine

## 2022-12-06 ENCOUNTER — Encounter (INDEPENDENT_AMBULATORY_CARE_PROVIDER_SITE_OTHER): Payer: Self-pay | Admitting: *Deleted

## 2022-12-30 ENCOUNTER — Other Ambulatory Visit: Payer: Self-pay | Admitting: Family Medicine

## 2023-01-03 ENCOUNTER — Encounter: Payer: Self-pay | Admitting: Family Medicine

## 2023-01-03 ENCOUNTER — Ambulatory Visit (INDEPENDENT_AMBULATORY_CARE_PROVIDER_SITE_OTHER): Payer: Medicare HMO | Admitting: Family Medicine

## 2023-01-03 VITALS — BP 128/82 | HR 73 | Ht 63.0 in | Wt 177.0 lb

## 2023-01-03 DIAGNOSIS — Z79899 Other long term (current) drug therapy: Secondary | ICD-10-CM | POA: Diagnosis not present

## 2023-01-03 DIAGNOSIS — Z1211 Encounter for screening for malignant neoplasm of colon: Secondary | ICD-10-CM

## 2023-01-03 DIAGNOSIS — E1159 Type 2 diabetes mellitus with other circulatory complications: Secondary | ICD-10-CM | POA: Diagnosis not present

## 2023-01-03 DIAGNOSIS — E785 Hyperlipidemia, unspecified: Secondary | ICD-10-CM

## 2023-01-03 DIAGNOSIS — H409 Unspecified glaucoma: Secondary | ICD-10-CM

## 2023-01-03 DIAGNOSIS — Z599 Problem related to housing and economic circumstances, unspecified: Secondary | ICD-10-CM | POA: Diagnosis not present

## 2023-01-03 DIAGNOSIS — E118 Type 2 diabetes mellitus with unspecified complications: Secondary | ICD-10-CM

## 2023-01-03 DIAGNOSIS — E669 Obesity, unspecified: Secondary | ICD-10-CM | POA: Diagnosis not present

## 2023-01-03 DIAGNOSIS — J4550 Severe persistent asthma, uncomplicated: Secondary | ICD-10-CM | POA: Diagnosis not present

## 2023-01-03 DIAGNOSIS — I10 Essential (primary) hypertension: Secondary | ICD-10-CM

## 2023-01-03 NOTE — Assessment & Plan Note (Addendum)
Daisy Robbins is reminded of the importance of commitment to daily physical activity for 30 minutes or more, as able and the need to limit carbohydrate intake to 30 to 60 grams per meal to help with blood sugar control.  Improved  The need to take medication as prescribed, test blood sugar as directed, and to call between visits if there is a concern that blood sugar is uncontrolled is also discussed.   Daisy Robbins is reminded of the importance of daily foot exam, annual eye examination, and good blood sugar, blood pressure and cholesterol control.     Latest Ref Rng & Units 09/09/2022   11:11 AM 06/10/2022    9:30 AM 11/25/2021   12:06 PM 06/30/2021    8:28 AM 09/18/2020    9:10 AM  Diabetic Labs  HbA1c 4.8 - 5.6 % 7.1  8.1  6.8  7.3  7.0   Micro/Creat Ratio 0 - 29 mg/g creat  <5      Chol 100 - 199 mg/dL  161   096  045   HDL >40 mg/dL  89   78  76   Calc LDL 0 - 99 mg/dL  79   57  89   Triglycerides 0 - 149 mg/dL  75   86  62   Creatinine 0.57 - 1.00 mg/dL 9.81  1.91   4.78  2.95       01/03/2023    3:44 PM 01/03/2023    2:59 PM 10/12/2022    9:05 AM 09/09/2022    9:55 AM 09/09/2022    9:54 AM 08/05/2022    9:16 AM 06/10/2022    9:16 AM  BP/Weight  Systolic BP 128 129 125 123 145 130 128  Diastolic BP 82 82 77 82 82 74 84  Wt. (Lbs)  177.04 180  181.12 183.4   BMI  31.36 kg/m2 31.89 kg/m2  32.08 kg/m2 32.49 kg/m2       Latest Ref Rng & Units 06/10/2022    8:20 AM 07/22/2020   12:00 AM  Foot/eye exam completion dates  Eye Exam No Retinopathy  No Retinopathy      Foot Form Completion  Done      This result is from an external source.

## 2023-01-03 NOTE — Assessment & Plan Note (Signed)
Controlled, no change in medication DASH diet and commitment to daily physical activity for a minimum of 30 minutes discussed and encouraged, as a part of hypertension management. The importance of attaining a healthy weight is also discussed.     01/03/2023    3:44 PM 01/03/2023    2:59 PM 10/12/2022    9:05 AM 09/09/2022    9:55 AM 09/09/2022    9:54 AM 08/05/2022    9:16 AM 06/10/2022    9:16 AM  BP/Weight  Systolic BP 128 129 125 123 145 130 128  Diastolic BP 82 82 77 82 82 74 84  Wt. (Lbs)  177.04 180  181.12 183.4   BMI  31.36 kg/m2 31.89 kg/m2  32.08 kg/m2 32.49 kg/m2

## 2023-01-03 NOTE — Assessment & Plan Note (Signed)
Refer to ne ophthalmologist as current has retired

## 2023-01-03 NOTE — Patient Instructions (Addendum)
F/U in mid October, call if you need me sooner  You a re referred to pharmacist for med assistance/ evaluation  You are referred to Dr Cathey Endow for eye exams/ care  You are referred to dr Earmon Phoenix for colonoscopy pls fill out questionnaire and ,mail asap, nurse pls print questionnaire if available  Fasting lipid, cmp and eGFr, hBA1C, cBC early October  Pls get shingrix vaccines, TdaP and covid vaccine at your pharmacy  It is important that you exercise regularly at least 30 minutes 5 times a week. If you develop chest pain, have severe difficulty breathing, or feel very tired, stop exercising immediately and seek medical attention   Thanks for choosing [facility], we consider it a privelige to serve you.

## 2023-01-03 NOTE — Assessment & Plan Note (Signed)
  Patient re-educated about  the importance of commitment to a  minimum of 150 minutes of exercise per week as able.  The importance of healthy food choices with portion control discussed, as well as eating regularly and within a 12 hour window most days. The need to choose "clean , green" food 50 to 75% of the time is discussed, as well as to make water the primary drink and set a goal of 64 ounces water daily.       01/03/2023    2:59 PM 10/12/2022    9:05 AM 09/09/2022    9:54 AM  Weight /BMI  Weight 177 lb 0.6 oz 180 lb 181 lb 1.9 oz  Height 5\' 3"  (1.6 m) 5\' 3"  (1.6 m) 5\' 3"  (1.6 m)  BMI 31.36 kg/m2 31.89 kg/m2 32.08 kg/m2    improved

## 2023-01-03 NOTE — Progress Notes (Signed)
Daisy Robbins     MRN: [id number]      DOB: [birth year]  Chief Complaint  Patient presents with   Follow-up    Follow up     HPI Ms. Daisy Robbins is here for follow up and re-evaluation of chronic medical conditions, medication management and review of any available recent lab and radiology data.  Preventive health is updated, specifically  Cancer screening and Immunization.   Questions or concerns regarding consultations or procedures which the PT has had in the interim are  addressed. The PT denies any adverse reactions to current medications since the last visit. States medication is working well for her esp the cough and asthma but in the donut hole and cannot afford symbicort as well as janumet, requests help, states has reached out independently to [employer] re symbicort, no assistance availanle Denies polyuria, polydipsia, blurred vision , or hypoglycemic episodes.  There are no new concerns.  There are no specific complaints   ROS Denies recent fever or chills. Denies sinus pressure, nasal congestion, ear pain or sore throat. Denies chest congestion, uncontrolled  cough or wheezing. Denies chest pains, palpitations and leg swelling Denies abdominal pain, nausea, vomiting,diarrhea or constipation.   Denies dysuria, frequency, hesitancy or incontinence. Denies joint pain, swelling and limitation in mobility. Denies headaches, seizures, numbness, or tingling. Denies depression, anxiety or insomnia. Denies skin break down or rash.   PE  BP 128/82   Pulse 73   Ht 5\' 3"  (1.6 m)   Wt 177 lb 0.6 oz (80.3 kg)   SpO2 95%   BMI 31.36 kg/m   Patient alert and oriented and in no cardiopulmonary distress.  HEENT: No facial asymmetry, EOMI,     Neck supple .  Chest: Clear to auscultation bilaterally.  CVS: S1, S2 no murmurs, no S3.Regular rate.  ABD: Soft non tender.   Ext: No edema  MS: Adequate ROM spine, shoulders, hips and knees.  Skin: Intact, no ulcerations or  rash noted.  Psych: Good eye contact, normal affect. Memory intact not anxious or depressed appearing.  CNS: CN 2-12 intact, power,  normal throughout.no focal deficits noted.   Assessment & Plan  Essential hypertension, benign Controlled, no change in medication DASH diet and commitment to daily physical activity for a minimum of 30 minutes discussed and encouraged, as a part of hypertension management. The importance of attaining a healthy weight is also discussed.     01/03/2023    3:44 PM 01/03/2023    2:59 PM 10/12/2022    9:05 AM 09/09/2022    9:55 AM 09/09/2022    9:54 AM 08/05/2022    9:16 AM 06/10/2022    9:16 AM  BP/Weight  Systolic BP 128 129 125 123 145 130 128  Diastolic BP 82 82 77 82 82 74 84  Wt. (Lbs)  177.04 180  181.12 183.4   BMI  31.36 kg/m2 31.89 kg/m2  32.08 kg/m2 32.49 kg/m2        Type 2 diabetes mellitus with vascular disease (HCC) Ms. Daisy Robbins is reminded of the importance of commitment to daily physical activity for 30 minutes or more, as able and the need to limit carbohydrate intake to 30 to 60 grams per meal to help with blood sugar control.  Improved  The need to take medication as prescribed, test blood sugar as directed, and to call between visits if there is a concern that blood sugar is uncontrolled is also discussed.   Ms. Daisy Robbins is reminded of  the importance of daily foot exam, annual eye examination, and good blood sugar, blood pressure and cholesterol control.     Latest Ref Rng & Units 09/09/2022   11:11 AM 06/10/2022    9:30 AM 11/25/2021   12:06 PM 06/30/2021    8:28 AM 09/18/2020    9:10 AM  Diabetic Labs  HbA1c 4.8 - 5.6 % 7.1  8.1  6.8  7.3  7.0   Micro/Creat Ratio 0 - 29 mg/g creat  <5      Chol 100 - 199 mg/dL  629   528  413   HDL >24 mg/dL  89   78  76   Calc LDL 0 - 99 mg/dL  79   57  89   Triglycerides 0 - 149 mg/dL  75   86  62   Creatinine 0.57 - 1.00 mg/dL 4.01  0.27   2.53  6.64       01/03/2023    3:44 PM 01/03/2023    2:59 PM  10/12/2022    9:05 AM 09/09/2022    9:55 AM 09/09/2022    9:54 AM 08/05/2022    9:16 AM 06/10/2022    9:16 AM  BP/Weight  Systolic BP 128 129 125 123 145 130 128  Diastolic BP 82 82 77 82 82 74 84  Wt. (Lbs)  177.04 180  181.12 183.4   BMI  31.36 kg/m2 31.89 kg/m2  32.08 kg/m2 32.49 kg/m2       Latest Ref Rng & Units 06/10/2022    8:20 AM 07/22/2020   12:00 AM  Foot/eye exam completion dates  Eye Exam No Retinopathy  No Retinopathy      Foot Form Completion  Done      This result is from an external source.        Severe persistent asthma Marked improvement on current meds per Pulmonary, however financial strain and challenge to purchase symbicort in donut hole Wii do pharmacy consult, pt reports reaching out to pharmaceutical company independently and being told that no patient assistance available  Unspecified glaucoma Refer to ne ophthalmologist as current has retired  Obesity (BMI 30.0-34.9)  Patient re-educated about  the importance of commitment to a  minimum of 150 minutes of exercise per week as able.  The importance of healthy food choices with portion control discussed, as well as eating regularly and within a 12 hour window most days. The need to choose "clean , green" food 50 to 75% of the time is discussed, as well as to make water the primary drink and set a goal of 64 ounces water daily.       01/03/2023    2:59 PM 10/12/2022    9:05 AM 09/09/2022    9:54 AM  Weight /BMI  Weight 177 lb 0.6 oz 180 lb 181 lb 1.9 oz  Height 5\' 3"  (1.6 m) 5\' 3"  (1.6 m) 5\' 3"  (1.6 m)  BMI 31.36 kg/m2 31.89 kg/m2 32.08 kg/m2    improved

## 2023-01-03 NOTE — Assessment & Plan Note (Signed)
Marked improvement on current meds per Pulmonary, however financial strain and challenge to purchase symbicort in donut hole Wii do pharmacy consult, pt reports reaching out to pharmaceutical company independently and being told that no patient assistance available

## 2023-01-05 ENCOUNTER — Encounter (INDEPENDENT_AMBULATORY_CARE_PROVIDER_SITE_OTHER): Payer: Self-pay | Admitting: *Deleted

## 2023-01-16 ENCOUNTER — Telehealth: Payer: Self-pay

## 2023-01-16 NOTE — Progress Notes (Signed)
[facility])  [facility] Quality Pharmacy Team   01/16/2023  KEAH PRUSS May 09, 1961 [id number]  Reason for referral: Medication assistance with Virgel Bouquet and Janumet  Referral source: Dr. Lodema Hong Current insurance: [employer]  Outreach:  Successful telephone call with Ms. Riedlinger.  HIPAA identifiers verified.    Medication Assistance Findings:  Medication assistance needs identified: screened patient for Breo and Janumet, patient qualifies for both patient assistance programs (PAPs).  Breo is replacing Symbicort, per Dr. Anthony Sar approval, as that medication is no longer available through a PAP.  Extra Help:  Not eligible for Extra Help Low Income Subsidy based on reported income and assets  Additional medication assistance options reviewed with patient as warranted:  No other options identified  Plan: I will route patient assistance letter to [facility] pharmacy technician who will coordinate patient assistance program application process for medications listed above.  [facility] pharmacy technician will assist with obtaining all required documents from both patient and provider(s) and submit application(s) once completed.  Thank you for allowing [facility] pharmacy to be a part of this patient's care.   Harlon Flor, PharmD Clinical Pharmacist  [facility] [employer] [phone]

## 2023-01-19 ENCOUNTER — Telehealth: Payer: Self-pay | Admitting: Pharmacy Technician

## 2023-01-19 DIAGNOSIS — Z5986 Financial insecurity: Secondary | ICD-10-CM

## 2023-01-19 NOTE — Progress Notes (Signed)
[facility] Daisy Robbins)                                            [facility] Quality Pharmacy Team    01/19/2023  Daisy Robbins 09/01/1960 [id number]                                      Medication Assistance Referral  Referral From: [facility] RPh Bethany B.   Medication/Company: Daisy Robbins / [employer] Patient application portion:  Mailed Provider application portion: Faxed  to Dr. Syliva Overman Provider address/fax verified via: Office website  Medication/Company: [name] / [employer] Patient application portion:  Mailed Provider application portion: Interoffice Mailed to Dr. Syliva Overman Provider address/fax verified via: Office website  Pattricia Boss, CPhT [place]  [facility] Office: [phone] Fax: [phone] Email: [email or web address]

## 2023-01-26 ENCOUNTER — Other Ambulatory Visit: Payer: Self-pay | Admitting: Family Medicine

## 2023-02-03 ENCOUNTER — Other Ambulatory Visit: Payer: Self-pay | Admitting: Family Medicine

## 2023-02-19 ENCOUNTER — Other Ambulatory Visit: Payer: Self-pay | Admitting: Family Medicine

## 2023-02-21 ENCOUNTER — Telehealth: Payer: Self-pay | Admitting: Family Medicine

## 2023-02-21 MED ORDER — FLUTICASONE FUROATE-VILANTEROL 100-25 MCG/ACT IN AEPB: 1.0000 | INHALATION_SPRAY | Freq: Every day | RESPIRATORY_TRACT | 0 refills | Status: DC

## 2023-02-21 NOTE — Telephone Encounter (Signed)
Pls fax printed rx for Breo to Noreene Larsson [facility] pharmacist and send her a message so she knows to expect it, sorry about the delay  [facility]

## 2023-02-22 NOTE — Telephone Encounter (Signed)
Faxed

## 2023-03-24 ENCOUNTER — Telehealth: Payer: Self-pay | Admitting: Pharmacy Technician

## 2023-03-24 DIAGNOSIS — Z5986 Financial insecurity: Secondary | ICD-10-CM

## 2023-03-24 NOTE — Progress Notes (Signed)
[facility] service manager [facility]) Quality Pharmacy Team [facility]   03/24/2023  JAKIERA SOLORZANO 24-Oct-1960 [id number]  Reason for referral: Medication Assistance  Referral source:  Harlon Flor, PharmD Current insurance: [facility]  Reason for call: Inquire if application received  Outreach:  Unsuccessful telephone call attempt #5 to patient., 6th overall call to patient in regard to [employer] app for [employer] and [employer] for [employer]. Unfortunately, patient did not answer phone. HIPAA compliant v/m left requesting a call back.Patient was mailed a set of applications on 8/15. Left 2 vm messages on 02/08/23 and 02/13/23. On the 3rd outreach call on 02/21/23, patient answered and informed she wasn't sure if she got the applications or not.  Another set of applications were mailed to the patient on 02/23/23 and there have been 3 unsuccessful outreaches made, and 3 additional v/m left with no return calls. The dates of those calls are 10/9, 10/14 and 10/18. Since unable to maintain contact with patient will close the case but wil gladly reopen if patient engages again.    Plan:  -I will close [facility] pharmacy case at this time as I have been unable to establish and/or maintain contact with patient.  Thank you for allowing [facility] pharmacy to be a part of this patient's care.   Pattricia Boss, CPhT [place]  Office: [phone] Fax: [phone] Email: [email or web address]

## 2023-03-29 ENCOUNTER — Other Ambulatory Visit: Payer: Self-pay | Admitting: Family Medicine

## 2023-04-05 DIAGNOSIS — I10 Essential (primary) hypertension: Secondary | ICD-10-CM | POA: Diagnosis not present

## 2023-04-05 DIAGNOSIS — E785 Hyperlipidemia, unspecified: Secondary | ICD-10-CM | POA: Diagnosis not present

## 2023-04-05 DIAGNOSIS — E1159 Type 2 diabetes mellitus with other circulatory complications: Secondary | ICD-10-CM | POA: Diagnosis not present

## 2023-04-06 ENCOUNTER — Ambulatory Visit: Payer: Medicare HMO | Admitting: Family Medicine

## 2023-04-06 LAB — CMP14+EGFR
ALT: 23 [IU]/L (ref 0–32)
AST: 23 [IU]/L (ref 0–40)
Albumin: 4.3 g/dL (ref 3.9–4.9)
Alkaline Phosphatase: 129 [IU]/L — ABNORMAL HIGH (ref 44–121)
BUN/Creatinine Ratio: 14 (ref 12–28)
BUN: 13 mg/dL (ref 8–27)
Bilirubin Total: 0.2 mg/dL (ref 0.0–1.2)
CO2: 27 mmol/L (ref 20–29)
Calcium: 10.3 mg/dL (ref 8.7–10.3)
Chloride: 99 mmol/L (ref 96–106)
Creatinine, Ser: 0.9 mg/dL (ref 0.57–1.00)
Globulin, Total: 3.1 g/dL (ref 1.5–4.5)
Glucose: 109 mg/dL — ABNORMAL HIGH (ref 70–99)
Potassium: 4.1 mmol/L (ref 3.5–5.2)
Sodium: 142 mmol/L (ref 134–144)
Total Protein: 7.4 g/dL (ref 6.0–8.5)
eGFR: 72 mL/min/{1.73_m2} (ref >59)

## 2023-04-06 LAB — CBC
Hematocrit: 38.6 % (ref 34.0–46.6)
Hemoglobin: 12.1 g/dL (ref 11.1–15.9)
MCH: 22.7 pg — ABNORMAL LOW (ref 26.6–33.0)
MCHC: 31.3 g/dL — ABNORMAL LOW (ref 31.5–35.7)
MCV: 72 fL — ABNORMAL LOW (ref 79–97)
Platelets: 285 10*3/uL (ref 150–450)
RBC: 5.33 x10E6/uL — ABNORMAL HIGH (ref 3.77–5.28)
RDW: 15.9 % — ABNORMAL HIGH (ref 11.7–15.4)
WBC: 8.9 10*3/uL (ref 3.4–10.8)

## 2023-04-06 LAB — LIPID PANEL
Chol/HDL Ratio: 2.1 {ratio} (ref 0.0–4.4)
Cholesterol, Total: 168 mg/dL (ref 100–199)
HDL: 80 mg/dL (ref >39)
LDL Chol Calc (NIH): 67 mg/dL (ref 0–99)
Triglycerides: 121 mg/dL (ref 0–149)
VLDL Cholesterol Cal: 21 mg/dL (ref 5–40)

## 2023-04-06 LAB — HEMOGLOBIN A1C
Est. average glucose Bld gHb Est-mCnc: 166 mg/dL
Hgb A1c MFr Bld: 7.4 % — ABNORMAL HIGH (ref 4.8–5.6)

## 2023-04-25 ENCOUNTER — Encounter: Payer: Self-pay | Admitting: Family Medicine

## 2023-04-25 ENCOUNTER — Encounter (INDEPENDENT_AMBULATORY_CARE_PROVIDER_SITE_OTHER): Payer: Self-pay | Admitting: *Deleted

## 2023-04-25 ENCOUNTER — Ambulatory Visit: Payer: Medicare HMO | Admitting: Family Medicine

## 2023-04-25 VITALS — BP 124/77 | HR 73 | Ht 63.0 in | Wt 178.0 lb

## 2023-04-25 DIAGNOSIS — J4550 Severe persistent asthma, uncomplicated: Secondary | ICD-10-CM

## 2023-04-25 DIAGNOSIS — N62 Hypertrophy of breast: Secondary | ICD-10-CM | POA: Diagnosis not present

## 2023-04-25 DIAGNOSIS — Z139 Encounter for screening, unspecified: Secondary | ICD-10-CM | POA: Insufficient documentation

## 2023-04-25 DIAGNOSIS — E559 Vitamin D deficiency, unspecified: Secondary | ICD-10-CM | POA: Diagnosis not present

## 2023-04-25 DIAGNOSIS — E785 Hyperlipidemia, unspecified: Secondary | ICD-10-CM

## 2023-04-25 DIAGNOSIS — R748 Abnormal levels of other serum enzymes: Secondary | ICD-10-CM | POA: Diagnosis not present

## 2023-04-25 DIAGNOSIS — Z1211 Encounter for screening for malignant neoplasm of colon: Secondary | ICD-10-CM

## 2023-04-25 DIAGNOSIS — E118 Type 2 diabetes mellitus with unspecified complications: Secondary | ICD-10-CM | POA: Diagnosis not present

## 2023-04-25 DIAGNOSIS — E1159 Type 2 diabetes mellitus with other circulatory complications: Secondary | ICD-10-CM

## 2023-04-25 DIAGNOSIS — Z7984 Long term (current) use of oral hypoglycemic drugs: Secondary | ICD-10-CM | POA: Diagnosis not present

## 2023-04-25 DIAGNOSIS — I10 Essential (primary) hypertension: Secondary | ICD-10-CM

## 2023-04-25 DIAGNOSIS — E66811 Obesity, class 1: Secondary | ICD-10-CM

## 2023-04-25 NOTE — Assessment & Plan Note (Signed)
Hyperlipidemia:Low fat diet discussed and encouraged.   Lipid Panel  Lab Results  Component Value Date   CHOL 168 04/05/2023   HDL 80 04/05/2023   LDLCALC 67 04/05/2023   TRIG 121 04/05/2023   CHOLHDL 2.1 04/05/2023     Controlled, no change in medication

## 2023-04-25 NOTE — Assessment & Plan Note (Signed)
Not at goal, recommend inc glipizide dose , pt resistant, wants to work on lifestyle, re eval in 13 weeks Ms. Lueck is reminded of the importance of commitment to daily physical activity for 30 minutes or more, as able and the need to limit carbohydrate intake to 30 to 60 grams per meal to help with blood sugar control.   The need to take medication as prescribed, test blood sugar as directed, and to call between visits if there is a concern that blood sugar is uncontrolled is also discussed.   Ms. Lowney is reminded of the importance of daily foot exam, annual eye examination, and good blood sugar, blood pressure and cholesterol control.     Latest Ref Rng & Units 04/05/2023    8:17 AM 09/09/2022   11:11 AM 06/10/2022    9:30 AM 11/25/2021   12:06 PM 06/30/2021    8:28 AM  Diabetic Labs  HbA1c 4.8 - 5.6 % 7.4  7.1  8.1  6.8  7.3   Micro/Creat Ratio 0 - 29 mg/g creat   <5     Chol 100 - 199 mg/dL 621   308   657   HDL >84 mg/dL 80   89   78   Calc LDL 0 - 99 mg/dL 67   79   57   Triglycerides 0 - 149 mg/dL 696   75   86   Creatinine 0.57 - 1.00 mg/dL 2.95  2.84  1.32   4.40       04/25/2023    8:11 AM 01/03/2023    3:44 PM 01/03/2023    2:59 PM 10/12/2022    9:05 AM 09/09/2022    9:55 AM 09/09/2022    9:54 AM 08/05/2022    9:16 AM  BP/Weight  Systolic BP 124 128 129 125 123 145 130  Diastolic BP 77 82 82 77 82 82 74  Wt. (Lbs) 178.04  177.04 180  181.12 183.4  BMI 31.54 kg/m2  31.36 kg/m2 31.89 kg/m2  32.08 kg/m2 32.49 kg/m2      Latest Ref Rng & Units 06/10/2022    8:20 AM 07/22/2020   12:00 AM  Foot/eye exam completion dates  Eye Exam No Retinopathy  No Retinopathy      Foot Form Completion  Done      This result is from an external source.

## 2023-04-25 NOTE — Assessment & Plan Note (Signed)
Hep C and HIV screening to be done, have been ordered

## 2023-04-25 NOTE — Assessment & Plan Note (Signed)
Updated lab needed at/ before next visit.   

## 2023-04-25 NOTE — Progress Notes (Signed)
Daisy Robbins     MRN: [id number]      DOB: 1961/02/26  Chief Complaint  Patient presents with   Follow-up    Follow up concerned breast maybe causing shoulder and back pain     HPI Daisy Robbins is here for follow up and re-evaluation of chronic medical conditions, medication management and review of any available recent lab and radiology data.  Preventive health is updated, specifically  Cancer screening and Immunization.   Questions or concerns regarding consultations or procedures which the PT has had in the interim are  addressed. The PT denies any adverse reactions to current medications since the last visit.  C/o chronic and worsening neck, shoulder, mid and upper back pain, wears a DD bra and attributes her pain to her large breasts Though blood sugar is uncontrolled wants another chance at lowering it without med adjustment, tho advise against thos based on history of HBa1C values Will commit to lifestyle including exercise , and will call in between if not working  ROS Denies recent fever or chills. Denies sinus pressure, nasal congestion, ear pain or sore throat. Denies chest congestion, productive cough or wheezing. Denies chest pains, palpitations and leg swelling Denies abdominal pain, nausea, vomiting,diarrhea or constipation.   Denies dysuria, frequency, hesitancy or incontinence. Denies headaches, seizures, numbness, or tingling. Denies depression, anxiety or insomnia. Denies skin break down or rash.   PE  BP 124/77 (BP Location: Right Arm, Patient Position: Sitting, Cuff Size: Large)   Pulse 73   Ht 5\' 3"  (1.6 m)   Wt 178 lb 0.6 oz (80.8 kg)   SpO2 95%   BMI 31.54 kg/m   Patient alert and oriented and in no cardiopulmonary distress.  HEENT: No facial asymmetry, EOMI,     Neck supple .  Chest: Clear to auscultation bilaterally.  CVS: S1, S2 no murmurs, no S3.Regular rate.  ABD: Soft non tender.   Ext: No edema  MS: Adequate ROM spine, shoulders, hips  and knees.  Skin: Intact, no ulcerations or rash noted.  Psych: Good eye contact, normal affect. Memory intact not anxious or depressed appearing.  CNS: CN 2-12 intact, power,  normal throughout.no focal deficits noted.   Assessment & Plan  Type 2 diabetes mellitus with vascular disease (HCC) Not at goal, recommend inc glipizide dose , pt resistant, wants to work on lifestyle, re eval in 13 weeks Daisy Robbins is reminded of the importance of commitment to daily physical activity for 30 minutes or more, as able and the need to limit carbohydrate intake to 30 to 60 grams per meal to help with blood sugar control.   The need to take medication as prescribed, test blood sugar as directed, and to call between visits if there is a concern that blood sugar is uncontrolled is also discussed.   Daisy Robbins is reminded of the importance of daily foot exam, annual eye examination, and good blood sugar, blood pressure and cholesterol control.     Latest Ref Rng & Units 04/05/2023    8:17 AM 09/09/2022   11:11 AM 06/10/2022    9:30 AM 11/25/2021   12:06 PM 06/30/2021    8:28 AM  Diabetic Labs  HbA1c 4.8 - 5.6 % 7.4  7.1  8.1  6.8  7.3   Micro/Creat Ratio 0 - 29 mg/g creat   <5     Chol 100 - 199 mg/dL 409   811   914   HDL >78 mg/dL 80  89   78   Calc LDL 0 - 99 mg/dL 67   79   57   Triglycerides 0 - 149 mg/dL 161   75   86   Creatinine 0.57 - 1.00 mg/dL 0.96  0.45  4.09   8.11       04/25/2023    8:11 AM 01/03/2023    3:44 PM 01/03/2023    2:59 PM 10/12/2022    9:05 AM 09/09/2022    9:55 AM 09/09/2022    9:54 AM 08/05/2022    9:16 AM  BP/Weight  Systolic BP 124 128 129 125 123 145 130  Diastolic BP 77 82 82 77 82 82 74  Wt. (Lbs) 178.04  177.04 180  181.12 183.4  BMI 31.54 kg/m2  31.36 kg/m2 31.89 kg/m2  32.08 kg/m2 32.49 kg/m2      Latest Ref Rng & Units 06/10/2022    8:20 AM 07/22/2020   12:00 AM  Foot/eye exam completion dates  Eye Exam No Retinopathy  No Retinopathy      Foot Form Completion   Done      This result is from an external source.        Vitamin D deficiency Updated lab needed at/ before next visit.   Severe persistent asthma Controlled, no change in medication   Obesity (BMI 30.0-34.9)  Patient re-educated about  the importance of commitment to a  minimum of 150 minutes of exercise per week as able.  The importance of healthy food choices with portion control discussed, as well as eating regularly and within a 12 hour window most days. The need to choose "clean , green" food 50 to 75% of the time is discussed, as well as to make water the primary drink and set a goal of 64 ounces water daily.       04/25/2023    8:11 AM 01/03/2023    2:59 PM 10/12/2022    9:05 AM  Weight /BMI  Weight 178 lb 0.6 oz 177 lb 0.6 oz 180 lb  Height 5\' 3"  (1.6 m) 5\' 3"  (1.6 m) 5\' 3"  (1.6 m)  BMI 31.54 kg/m2 31.36 kg/m2 31.89 kg/m2    Unchanged  Essential hypertension, benign Controlled, no change in medication DASH diet and commitment to daily physical activity for a minimum of 30 minutes discussed and encouraged, as a part of hypertension management. The importance of attaining a healthy weight is also discussed.     04/25/2023    8:11 AM 01/03/2023    3:44 PM 01/03/2023    2:59 PM 10/12/2022    9:05 AM 09/09/2022    9:55 AM 09/09/2022    9:54 AM 08/05/2022    9:16 AM  BP/Weight  Systolic BP 124 128 129 125 123 145 130  Diastolic BP 77 82 82 77 82 82 74  Wt. (Lbs) 178.04  177.04 180  181.12 183.4  BMI 31.54 kg/m2  31.36 kg/m2 31.89 kg/m2  32.08 kg/m2 32.49 kg/m2       Hyperlipidemia LDL goal <100 Hyperlipidemia:Low fat diet discussed and encouraged.   Lipid Panel  Lab Results  Component Value Date   CHOL 168 04/05/2023   HDL 80 04/05/2023   LDLCALC 67 04/05/2023   TRIG 121 04/05/2023   CHOLHDL 2.1 04/05/2023     Controlled, no change in medication   Screening due Hep C and HIV screening to be done, have been ordered  Large breasts Increasing  pain, back and shoulder, wears DD, refer to plastics  Elevated alkaline phosphatase level Mildly elevated alk phos x 2 and needs colonoscopy, refer GI

## 2023-04-25 NOTE — Assessment & Plan Note (Signed)
Increasing pain, back and shoulder, wears DD, refer to plastics

## 2023-04-25 NOTE — Assessment & Plan Note (Signed)
Mildly elevated alk phos x 2 and needs colonoscopy, refer GI

## 2023-04-25 NOTE — Patient Instructions (Addendum)
F/U in 13 weeks call if you need   me sooner  Please get Shingrix vaccines and TdAP at your pharmacy  Goal for fasting blood sugar ranges from 80 to 120  It is important that you exercise regularly at least 30 minutes 5 times a week. If you develop chest pain, have severe difficulty breathing, or feel very tired, stop exercising immediately and seek medical attention    You are referred for colonoscopy, and to evaluate abn lab with GI   You are referred to Plastic surgery as discussed  Hep C and hIV screen , HBA1C , CBC, tSH and vit D and Chem 7 and EGFR 3 to 5 days before next appt  It is important that you exercise regularly at least 30 minutes 5 times a week. If you develop chest pain, have severe difficulty breathing, or feel very tired, stop exercising immediately and seek medical attention

## 2023-04-25 NOTE — Assessment & Plan Note (Signed)
Controlled, no change in medication  

## 2023-04-25 NOTE — Assessment & Plan Note (Signed)
Controlled, no change in medication DASH diet and commitment to daily physical activity for a minimum of 30 minutes discussed and encouraged, as a part of hypertension management. The importance of attaining a healthy weight is also discussed.     04/25/2023    8:11 AM 01/03/2023    3:44 PM 01/03/2023    2:59 PM 10/12/2022    9:05 AM 09/09/2022    9:55 AM 09/09/2022    9:54 AM 08/05/2022    9:16 AM  BP/Weight  Systolic BP 124 128 129 125 123 145 130  Diastolic BP 77 82 82 77 82 82 74  Wt. (Lbs) 178.04  177.04 180  181.12 183.4  BMI 31.54 kg/m2  31.36 kg/m2 31.89 kg/m2  32.08 kg/m2 32.49 kg/m2

## 2023-04-25 NOTE — Assessment & Plan Note (Signed)
  Patient re-educated about  the importance of commitment to a  minimum of 150 minutes of exercise per week as able.  The importance of healthy food choices with portion control discussed, as well as eating regularly and within a 12 hour window most days. The need to choose "clean , green" food 50 to 75% of the time is discussed, as well as to make water the primary drink and set a goal of 64 ounces water daily.       04/25/2023    8:11 AM 01/03/2023    2:59 PM 10/12/2022    9:05 AM  Weight /BMI  Weight 178 lb 0.6 oz 177 lb 0.6 oz 180 lb  Height 5\' 3"  (1.6 m) 5\' 3"  (1.6 m) 5\' 3"  (1.6 m)  BMI 31.54 kg/m2 31.36 kg/m2 31.89 kg/m2    Unchanged

## 2023-05-01 ENCOUNTER — Ambulatory Visit: Payer: Medicare HMO

## 2023-05-29 DIAGNOSIS — H401132 Primary open-angle glaucoma, bilateral, moderate stage: Secondary | ICD-10-CM | POA: Diagnosis not present

## 2023-05-29 DIAGNOSIS — E119 Type 2 diabetes mellitus without complications: Secondary | ICD-10-CM | POA: Diagnosis not present

## 2023-05-29 DIAGNOSIS — H26492 Other secondary cataract, left eye: Secondary | ICD-10-CM | POA: Diagnosis not present

## 2023-05-29 DIAGNOSIS — H524 Presbyopia: Secondary | ICD-10-CM | POA: Diagnosis not present

## 2023-05-29 LAB — HM DIABETES EYE EXAM

## 2023-06-06 ENCOUNTER — Ambulatory Visit (INDEPENDENT_AMBULATORY_CARE_PROVIDER_SITE_OTHER): Payer: Medicare HMO | Admitting: Gastroenterology

## 2023-06-06 ENCOUNTER — Encounter (INDEPENDENT_AMBULATORY_CARE_PROVIDER_SITE_OTHER): Payer: Self-pay | Admitting: Gastroenterology

## 2023-06-06 VITALS — BP 125/81 | HR 71 | Temp 97.8°F | Ht 63.0 in | Wt 181.3 lb

## 2023-06-06 DIAGNOSIS — Z6832 Body mass index (BMI) 32.0-32.9, adult: Secondary | ICD-10-CM | POA: Insufficient documentation

## 2023-06-06 DIAGNOSIS — R748 Abnormal levels of other serum enzymes: Secondary | ICD-10-CM | POA: Insufficient documentation

## 2023-06-06 DIAGNOSIS — Z1211 Encounter for screening for malignant neoplasm of colon: Secondary | ICD-10-CM

## 2023-06-06 NOTE — Patient Instructions (Signed)
It was very nice to meet you today, as dicussed with will plan for the following :  1) Labwork and ultrasound 2) colonoscopy

## 2023-06-06 NOTE — Progress Notes (Addendum)
 Cain Fitzhenry Faizan Jearldine Cassady , M.D. Gastroenterology & Hepatology [facility] Gastroenterology [address] [place], [place] [address] Primary Care Physician: Antonetta Rollene BRAVO, MD [address] [place] [address]  Chief Complaint:  elevated ALP and Colon cancer screening   History of Present Illness: Daisy Robbins is a 62 y.o. female with diabetes, vitamin D  deficiency, asthma, obesity hyperlipidemia who presents for evaluation of elevated ALP and Colon cancer screening .  Patient does not have any GI complaints.  Denies any generalized itching or yellowing of skin.  Denies any herbal medication.  Does not know her status for hepatitis A and B vaccination.The patient denies having any nausea, vomiting, fever, chills, hematochezia, melena, hematemesis, abdominal distention, abdominal pain, diarrhea, jaundice, pruritus or weight loss.  Last ZHI:wnwz Last Colonoscopy:2013  Prep excellent. Small polyps ablated via cold biopsy from hepatic flexure and submitted together. Normal rectal mucosa. Small hemorrhoids below the dentate line.  Repeat 10 years   FHx: neg for any gastrointestinal/liver disease, no malignancies Social: neg smoking, alcohol or illicit drug use Surgical: Hysterectomy  Labs from 03/26/2023 hemoglobin A1c 7.4 hemoglobin 12.1 platelet 285 Alk phos 113 previously 125, T. bili normal AST ALT, repeat alk phos 129 2 months  Past Medical History: Past Medical History:  Diagnosis Date   Allergy    Diabetes mellitus    Hypertension     Past Surgical History: Past Surgical History:  Procedure Laterality Date   ABDOMINAL HYSTERECTOMY     CESAREAN SECTION     COLONOSCOPY  03/01/2012   Procedure: COLONOSCOPY;  Surgeon: Claudis RAYMOND Rivet, MD;  Location: [facility];  Service: Endoscopy;  Laterality: N/A;  950   right carpal tunnel release      Family History: Family History  Problem Relation Age of Onset   Hypertension Mother     Cancer Father        colon    Hypertension Sister    Diabetes Sister    Hypertension Sister    Hypertension Brother    Asthma Brother    Colon cancer Neg Hx     Social History: Social History   Tobacco Use  Smoking Status Never  Smokeless Tobacco Never   Social History   Substance and Sexual Activity  Alcohol Use No   Social History   Substance and Sexual Activity  Drug Use No    Allergies: No Known Allergies  Medications: Current Outpatient Medications  Medication Sig Dispense Refill   albuterol  (VENTOLIN  HFA) 108 (90 Base) MCG/ACT inhaler Inhale 2 puffs into the lungs every 6 (six) hours as needed for wheezing or shortness of breath. 18 g 3   Blood Glucose Monitoring Suppl DEVI 1 each by Does not apply route in the morning, at noon, and at bedtime. May substitute to any manufacturer covered by patient's insurance. 1 each 0   glipiZIDE  (GLUCOTROL  XL) 2.5 MG 24 hr tablet TAKE 1 TABLET BY MOUTH EVERY DAY WITH BREAKFAST 90 tablet 1   latanoprost (XALATAN) 0.005 % ophthalmic solution 1 drop at bedtime.     loratadine  (CLARITIN ) 10 MG tablet TAKE 1 TABLET BY MOUTH EVERY DAY 90 tablet 1   montelukast  (SINGULAIR ) 10 MG tablet TAKE 1 TABLET BY MOUTH EVERYDAY AT BEDTIME 90 tablet 1   Multiple Vitamin (MULTIVITAMIN) tablet Take 1 tablet by mouth daily.     PROAIR  HFA 108 (90 Base) MCG/ACT inhaler Inhale 2 puffs into the lungs every 6 (six) hours as needed for  wheezing or shortness of breath. 18 each 3   rosuvastatin  (CRESTOR ) 5 MG tablet TAKE 1 TABLET EVERY MONDAY AND THURSDAY 24 tablet 3   SitaGLIPtin -MetFORMIN  HCl (JANUMET  XR) 50-1000 MG TB24 TAKE 2 TABLETS EVERY DAY 180 tablet 3   valsartan -hydrochlorothiazide  (DIOVAN -HCT) 320-25 MG tablet TAKE 1 TABLET BY MOUTH EVERY DAY 90 tablet 1   No current facility-administered medications for this visit.    Review of Systems: GENERAL: negative for malaise, night sweats HEENT: No changes in hearing or vision, no nose bleeds or  other nasal problems. NECK: Negative for lumps, goiter, pain and significant neck swelling RESPIRATORY: Negative for cough, wheezing CARDIOVASCULAR: Negative for chest pain, leg swelling, palpitations, orthopnea GI: SEE HPI MUSCULOSKELETAL: Negative for joint pain or swelling, back pain, and muscle pain. SKIN: Negative for lesions, rash HEMATOLOGY Negative for prolonged bleeding, bruising easily, and swollen nodes. ENDOCRINE: Negative for cold or heat intolerance, polyuria, polydipsia and goiter. NEURO: negative for tremor, gait imbalance, syncope and seizures. The remainder of the review of systems is noncontributory.   Physical Exam: BP 125/81   Pulse 71   Temp 97.8 F (36.6 C)   Ht 5' 3 (1.6 m)   Wt 181 lb 4.8 oz (82.2 kg)   BMI 32.12 kg/m  GENERAL: The patient is AO x3, in no acute distress. HEENT: Head is normocephalic and atraumatic. EOMI are intact. Mouth is well hydrated and without lesions. NECK: Supple. No masses LUNGS: Clear to auscultation. No presence of rhonchi/wheezing/rales. Adequate chest expansion HEART: RRR, normal s1 and s2. ABDOMEN: Soft, nontender, no guarding, no peritoneal signs, and nondistended. BS +. No masses.   Imaging/Labs: as above     Latest Ref Rng & Units 04/05/2023    8:17 AM 06/10/2022    9:30 AM 06/30/2021    8:28 AM  CBC  WBC 3.4 - 10.8 x10E3/uL 8.9  11.3  9.7   Hemoglobin 11.1 - 15.9 g/dL 87.8  87.6  87.8   Hematocrit 34.0 - 46.6 % 38.6  37.4  37.1   Platelets 150 - 450 x10E3/uL 285  343     No results found for: IRON, TIBC, FERRITIN  I personally reviewed and interpreted the available labs, imaging and endoscopic files.  Impression and Plan:  Daisy Robbins is a 62 y.o. female with diabetes, vitamin D  deficiency, asthma, obesity hyperlipidemia who presents for evaluation of elevated ALP and Colon cancer screening .  #Elevated ALP  Patient has intermittent slightly elevated ALP without any T. bili elevation and normal  AST ALT  Will obtain baseline right upper quadrant sono, viral hepatitis profile and AMA to screen for PBC  #CRC Screening   The patient was counseled regarding the importance of colorectal cancer screening,The benefits of screening include early detection of colorectal cancer and precancerous polyps, which can improve treatment outcomes and reduce mortality. Risks associated with screening, particularly colonoscopy, include potential complications such as bleeding and perforation. After deciding different modalities for screening for colon cancer , patient has opted to pursue Colonoscopy . Last colonoscopy 2013 and due for one now   #BMI 32       - walking at a brisk pace/biking at moderate intesity 2.5-5 hours per week     - use pedometer/step counter to track activity     - goal to lose 5-10% of initial body weight     - avoid suagry drinks and juices, use zero calorie beverages     - increase water  intake     -  eat a low carb diet with plenty of veggies and fruit     - Get sufficient sleep 7-8 hrs nightly     - maitain active lifestyle     - avoid alcohol     - recommend 2-3 cups Coffee daily     - Counsel on lowering cholesterol by having a diet rich in vegetables,          protein (avoid red meats) and good fats(fish, salmon).  All questions were answered.      Jasa Dundon Faizan Niaomi Cartaya, MD Gastroenterology and Hepatology [facility] Gastroenterology   This chart has been completed using [facility] Dictation software, and while attempts have been made to ensure accuracy , certain words and phrases may not be transcribed as intended \

## 2023-06-06 NOTE — Addendum Note (Signed)
 Addended by: Marlowe Shores on: 06/06/2023 10:59 AM   Modules accepted: Orders

## 2023-06-14 ENCOUNTER — Ambulatory Visit (HOSPITAL_COMMUNITY)
Admission: RE | Admit: 2023-06-14 | Discharge: 2023-06-14 | Disposition: A | Discharge status: Home / Self Care | Payer: Medicare HMO | Source: Ambulatory Visit | Attending: Gastroenterology | Admitting: Gastroenterology

## 2023-06-14 DIAGNOSIS — R7989 Other specified abnormal findings of blood chemistry: Secondary | ICD-10-CM | POA: Diagnosis not present

## 2023-06-14 DIAGNOSIS — R748 Abnormal levels of other serum enzymes: Secondary | ICD-10-CM | POA: Diagnosis not present

## 2023-06-16 ENCOUNTER — Encounter (INDEPENDENT_AMBULATORY_CARE_PROVIDER_SITE_OTHER): Payer: Self-pay | Admitting: *Deleted

## 2023-06-16 NOTE — Progress Notes (Signed)
 I tried calling patient and received her voicemail. I left on her voicemail to check her MyChart for results and to let me know if any questions.

## 2023-06-16 NOTE — Progress Notes (Signed)
 Hi Wendy ,  Can you please call the patient and tell the patient the ultrasound showed fatty liver but No evidence of inflammation of gallbaldder or gallstones  Thanks,  Shonya Sumida Faizan Srihitha Tagliaferri, MD Gastroenterology and Hepatology [facility] Gastroenterology

## 2023-07-03 ENCOUNTER — Telehealth (INDEPENDENT_AMBULATORY_CARE_PROVIDER_SITE_OTHER): Payer: Self-pay | Admitting: Gastroenterology

## 2023-07-03 NOTE — Telephone Encounter (Signed)
Pt called in and needed to reschedule colonoscopy due to being sick. Pt has been rescheduled to 07/24/23 at 9:30am. New instructions will be sent. Pt has not began prep at this time.

## 2023-07-10 ENCOUNTER — Encounter (INDEPENDENT_AMBULATORY_CARE_PROVIDER_SITE_OTHER): Payer: Self-pay | Admitting: *Deleted

## 2023-07-14 ENCOUNTER — Institutional Professional Consult (permissible substitution): Payer: Medicare HMO | Admitting: Plastic Surgery

## 2023-07-21 DIAGNOSIS — H401131 Primary open-angle glaucoma, bilateral, mild stage: Secondary | ICD-10-CM | POA: Diagnosis not present

## 2023-07-24 ENCOUNTER — Encounter (INDEPENDENT_AMBULATORY_CARE_PROVIDER_SITE_OTHER): Payer: Self-pay

## 2023-07-24 ENCOUNTER — Telehealth (INDEPENDENT_AMBULATORY_CARE_PROVIDER_SITE_OTHER): Payer: Self-pay | Admitting: Gastroenterology

## 2023-07-24 NOTE — Telephone Encounter (Signed)
 Pt called back into office and states that her eye appt is on 2/28 so she will need to reschedule. Rescheduled pt for 3/28. Will send updated instructions via my chart.

## 2023-07-24 NOTE — Telephone Encounter (Signed)
 Pt called into office and states she did not do her prep as far as clear liquids and has not drank any of prep. Pt has rescheduled to 08/04/23 at 10. Instructions sent via my chart.

## 2023-07-27 ENCOUNTER — Ambulatory Visit: Payer: Medicare HMO | Admitting: Family Medicine

## 2023-08-04 DIAGNOSIS — H25011 Cortical age-related cataract, right eye: Secondary | ICD-10-CM | POA: Diagnosis not present

## 2023-08-04 DIAGNOSIS — H401131 Primary open-angle glaucoma, bilateral, mild stage: Secondary | ICD-10-CM | POA: Diagnosis not present

## 2023-08-04 DIAGNOSIS — H26492 Other secondary cataract, left eye: Secondary | ICD-10-CM | POA: Diagnosis not present

## 2023-08-29 ENCOUNTER — Encounter (INDEPENDENT_AMBULATORY_CARE_PROVIDER_SITE_OTHER): Payer: Self-pay

## 2023-08-31 DIAGNOSIS — R748 Abnormal levels of other serum enzymes: Secondary | ICD-10-CM | POA: Diagnosis not present

## 2023-08-31 DIAGNOSIS — Z114 Encounter for screening for human immunodeficiency virus [HIV]: Secondary | ICD-10-CM | POA: Diagnosis not present

## 2023-08-31 DIAGNOSIS — Z1159 Encounter for screening for other viral diseases: Secondary | ICD-10-CM | POA: Diagnosis not present

## 2023-09-01 ENCOUNTER — Encounter (INDEPENDENT_AMBULATORY_CARE_PROVIDER_SITE_OTHER): Payer: Self-pay | Admitting: *Deleted

## 2023-09-01 ENCOUNTER — Ambulatory Visit (HOSPITAL_COMMUNITY)
Admission: RE | Admit: 2023-09-01 | Discharge: 2023-09-01 | Disposition: A | Discharge status: Home / Self Care | Payer: Medicare HMO | Attending: Gastroenterology | Admitting: Gastroenterology

## 2023-09-01 ENCOUNTER — Other Ambulatory Visit: Payer: Self-pay

## 2023-09-01 ENCOUNTER — Ambulatory Visit (HOSPITAL_COMMUNITY): Payer: Self-pay | Admitting: Anesthesiology

## 2023-09-01 ENCOUNTER — Encounter (HOSPITAL_COMMUNITY)
Admission: RE | Disposition: A | Discharge status: Home / Self Care | Payer: Self-pay | Source: Home / Self Care | Attending: Gastroenterology

## 2023-09-01 ENCOUNTER — Ambulatory Visit (HOSPITAL_BASED_OUTPATIENT_CLINIC_OR_DEPARTMENT_OTHER): Payer: Self-pay | Admitting: Anesthesiology

## 2023-09-01 ENCOUNTER — Encounter (HOSPITAL_COMMUNITY): Payer: Self-pay | Admitting: Gastroenterology

## 2023-09-01 DIAGNOSIS — Z833 Family history of diabetes mellitus: Secondary | ICD-10-CM | POA: Insufficient documentation

## 2023-09-01 DIAGNOSIS — K5730 Diverticulosis of large intestine without perforation or abscess without bleeding: Secondary | ICD-10-CM | POA: Diagnosis not present

## 2023-09-01 DIAGNOSIS — Z8249 Family history of ischemic heart disease and other diseases of the circulatory system: Secondary | ICD-10-CM | POA: Diagnosis not present

## 2023-09-01 DIAGNOSIS — Z800 Family history of malignant neoplasm of digestive organs: Secondary | ICD-10-CM | POA: Insufficient documentation

## 2023-09-01 DIAGNOSIS — E119 Type 2 diabetes mellitus without complications: Secondary | ICD-10-CM | POA: Diagnosis not present

## 2023-09-01 DIAGNOSIS — I10 Essential (primary) hypertension: Secondary | ICD-10-CM | POA: Diagnosis not present

## 2023-09-01 DIAGNOSIS — Z1211 Encounter for screening for malignant neoplasm of colon: Secondary | ICD-10-CM | POA: Diagnosis not present

## 2023-09-01 DIAGNOSIS — K648 Other hemorrhoids: Secondary | ICD-10-CM

## 2023-09-01 DIAGNOSIS — Z7984 Long term (current) use of oral hypoglycemic drugs: Secondary | ICD-10-CM | POA: Insufficient documentation

## 2023-09-01 DIAGNOSIS — J45909 Unspecified asthma, uncomplicated: Secondary | ICD-10-CM | POA: Diagnosis not present

## 2023-09-01 HISTORY — DX: Nausea with vomiting, unspecified: R11.2

## 2023-09-01 HISTORY — DX: Other specified postprocedural states: Z98.890

## 2023-09-01 HISTORY — PX: COLONOSCOPY WITH PROPOFOL: SHX5780

## 2023-09-01 LAB — HEPATITIS B SURFACE ANTIGEN: Hepatitis B Surface Ag: NEGATIVE

## 2023-09-01 LAB — HEPATITIS A ANTIBODY, TOTAL: hep A Total Ab: NEGATIVE

## 2023-09-01 LAB — GLUCOSE, CAPILLARY: Glucose-Capillary: 148 mg/dL — ABNORMAL HIGH (ref 70–99)

## 2023-09-01 LAB — HEPATITIS B CORE ANTIBODY, TOTAL: Hep B Core Total Ab: NEGATIVE

## 2023-09-01 LAB — HM COLONOSCOPY

## 2023-09-01 LAB — HEPATITIS C ANTIBODY: Hep C Virus Ab: NONREACTIVE

## 2023-09-01 LAB — HIV ANTIBODY (ROUTINE TESTING W REFLEX): HIV Screen 4th Generation wRfx: NONREACTIVE

## 2023-09-01 LAB — HEPATITIS B SURFACE ANTIBODY,QUALITATIVE: Hep B Surface Ab, Qual: NONREACTIVE

## 2023-09-01 LAB — MITOCHONDRIAL ANTIBODIES: Mitochondrial Ab: <20 U (ref 0.0–20.0)

## 2023-09-01 SURGERY — COLONOSCOPY WITH PROPOFOL
Anesthesia: General

## 2023-09-01 MED ORDER — PHENYLEPHRINE 80 MCG/ML (10ML) SYRINGE FOR IV PUSH (FOR BLOOD PRESSURE SUPPORT)
PREFILLED_SYRINGE | INTRAVENOUS | Status: DC | PRN
  Administered 2023-09-01: 80 ug via INTRAVENOUS
  Administered 2023-09-01: 160 ug via INTRAVENOUS

## 2023-09-01 MED ORDER — LACTATED RINGERS IV SOLN: INTRAVENOUS | Status: DC

## 2023-09-01 MED ORDER — PHENYLEPHRINE 80 MCG/ML (10ML) SYRINGE FOR IV PUSH (FOR BLOOD PRESSURE SUPPORT)
PREFILLED_SYRINGE | INTRAVENOUS | Status: AC
  Filled 2023-09-01: qty 10

## 2023-09-01 MED ORDER — PROPOFOL 10 MG/ML IV BOLUS
INTRAVENOUS | Status: DC | PRN
  Administered 2023-09-01: 100 mg via INTRAVENOUS

## 2023-09-01 MED ORDER — PROPOFOL 500 MG/50ML IV EMUL
INTRAVENOUS | Status: DC | PRN
  Administered 2023-09-01: 150 ug/kg/min via INTRAVENOUS

## 2023-09-01 MED ORDER — LIDOCAINE HCL (PF) 2 % IJ SOLN
INTRAMUSCULAR | Status: DC | PRN
  Administered 2023-09-01: 50 mg via INTRADERMAL

## 2023-09-01 NOTE — Anesthesia Preprocedure Evaluation (Signed)
 Anesthesia Evaluation  Patient identified by MRN, date of birth, ID band Patient awake    Reviewed: Allergy & Precautions, H&P , NPO status , Patient's Chart, lab work & pertinent test results, reviewed documented beta blocker date and time   History of Anesthesia Complications (+) PONV and history of anesthetic complications  Airway Mallampati: II  TM Distance: >3 FB Neck ROM: full    Dental no notable dental hx.    Pulmonary asthma    Pulmonary exam normal breath sounds clear to auscultation       Cardiovascular Exercise Tolerance: Good hypertension, + DOE   Rhythm:regular Rate:Normal     Neuro/Psych negative neurological ROS  negative psych ROS   GI/Hepatic negative GI ROS, Neg liver ROS,,,  Endo/Other  diabetes    Renal/GU negative Renal ROS  negative genitourinary   Musculoskeletal   Abdominal   Peds  Hematology negative hematology ROS (+)   Anesthesia Other Findings   Reproductive/Obstetrics negative OB ROS                             Anesthesia Physical Anesthesia Plan  ASA: 2  Anesthesia Plan: General   Post-op Pain Management:    Induction:   PONV Risk Score and Plan: Propofol infusion  Airway Management Planned:   Additional Equipment:   Intra-op Plan:   Post-operative Plan:   Informed Consent: I have reviewed the patients History and Physical, chart, labs and discussed the procedure including the risks, benefits and alternatives for the proposed anesthesia with the patient or authorized representative who has indicated his/her understanding and acceptance.     Dental Advisory Given  Plan Discussed with: CRNA  Anesthesia Plan Comments:        Anesthesia Quick Evaluation

## 2023-09-01 NOTE — Discharge Instructions (Signed)

## 2023-09-01 NOTE — Transfer of Care (Signed)
 Immediate Anesthesia Transfer of Care Note  Patient: Daisy Robbins  Procedure(s) Performed: COLONOSCOPY WITH PROPOFOL  Patient Location: Endoscopy Unit  Anesthesia Type:General  Level of Consciousness: awake, alert , and oriented  Airway & Oxygen Therapy: Patient Spontanous Breathing  Post-op Assessment: Report given to RN and Post -op Vital signs reviewed and stable  Post vital signs: Reviewed and stable  Last Vitals:  Vitals Value Taken Time  BP    Temp    Pulse    Resp    SpO2      Last Pain:  Vitals:   09/01/23 0920  TempSrc:   PainSc: 0-No pain      Patients Stated Pain Goal: 4 (09/01/23 0821)  Complications: No notable events documented.

## 2023-09-01 NOTE — Anesthesia Postprocedure Evaluation (Signed)
 Anesthesia Post Note  Patient: Daisy Robbins  Procedure(s) Performed: COLONOSCOPY WITH PROPOFOL  Patient location during evaluation: Phase II Anesthesia Type: General Level of consciousness: awake Pain management: pain level controlled Vital Signs Assessment: post-procedure vital signs reviewed and stable Respiratory status: spontaneous breathing and respiratory function stable Cardiovascular status: blood pressure returned to baseline and stable Postop Assessment: no headache and no apparent nausea or vomiting Anesthetic complications: no Comments: Late entry   No notable events documented.   Last Vitals:  Vitals:   09/01/23 0821 09/01/23 0943  BP: 128/84 (!) 97/43  Pulse: 78 74  Resp: 15 19  Temp: 36.9 C 36.6 C  SpO2: 97% 100%    Last Pain:  Vitals:   09/01/23 0943  TempSrc: Oral  PainSc: 0-No pain                 Windell Norfolk

## 2023-09-01 NOTE — Anesthesia Procedure Notes (Signed)
 Date/Time: 09/01/2023 9:20 AM  Performed by: Julian Reil, CRNAPre-anesthesia Checklist: Patient identified, Emergency Drugs available, Suction available and Patient being monitored Patient Re-evaluated:Patient Re-evaluated prior to induction Oxygen Delivery Method: Non-rebreather mask Induction Type: IV induction Placement Confirmation: positive ETCO2

## 2023-09-01 NOTE — H&P (Signed)
 Primary Care Physician:  Kerri Perches, MD Primary Gastroenterologist:  Dr. Tasia Catchings  Pre-Procedure History & Physical: HPI:  Daisy Robbins is a 63 y.o. female is here for a colonoscopy for colon cancer screening purposes.  Patient denies any family history of colorectal cancer.  No melena or hematochezia.  No abdominal pain or unintentional weight loss.  No change in bowel habits.  Overall feels well from a GI standpoint.  Past Medical History:  Diagnosis Date   Allergy    Diabetes mellitus    Hypertension    PONV (postoperative nausea and vomiting)     Past Surgical History:  Procedure Laterality Date   ABDOMINAL HYSTERECTOMY     CESAREAN SECTION     COLONOSCOPY  03/01/2012   Procedure: COLONOSCOPY;  Surgeon: Malissa Hippo, MD;  Location: [facility];  Service: Endoscopy;  Laterality: N/A;  950   right carpal tunnel release      Prior to Admission medications   Medication Sig Start Date End Date Taking? Authorizing Provider  albuterol (VENTOLIN HFA) 108 (90 Base) MCG/ACT inhaler Inhale 2 puffs into the lungs every 6 (six) hours as needed for wheezing or shortness of breath. 07/19/22  Yes Kerri Perches, MD  Blood Glucose Monitoring Suppl DEVI 1 each by Does not apply route in the morning, at noon, and at bedtime. May substitute to any manufacturer covered by patient's insurance. 09/09/22  Yes Kerri Perches, MD  glipiZIDE (GLUCOTROL XL) 2.5 MG 24 hr tablet TAKE 1 TABLET BY MOUTH EVERY DAY WITH BREAKFAST 02/03/23  Yes Kerri Perches, MD  latanoprost (XALATAN) 0.005 % ophthalmic solution 1 drop at bedtime. 07/13/21  Yes [provider]  loratadine (CLARITIN) 10 MG tablet TAKE 1 TABLET BY MOUTH EVERY DAY 08/26/22  Yes Kerri Perches, MD  montelukast (SINGULAIR) 10 MG tablet TAKE 1 TABLET BY MOUTH EVERYDAY AT BEDTIME 12/30/22  Yes Kerri Perches, MD  Multiple Vitamin (MULTIVITAMIN) tablet Take 1 tablet by mouth daily.   Yes [provider]   PROAIR HFA 108 (90 Base) MCG/ACT inhaler Inhale 2 puffs into the lungs every 6 (six) hours as needed for wheezing or shortness of breath. 07/19/22  Yes Kerri Perches, MD  rosuvastatin (CRESTOR) 5 MG tablet TAKE 1 TABLET EVERY MONDAY AND THURSDAY 03/29/23  Yes Kerri Perches, MD  valsartan-hydrochlorothiazide (DIOVAN-HCT) 320-25 MG tablet TAKE 1 TABLET BY MOUTH EVERY DAY 01/26/23  Yes Kerri Perches, MD  SitaGLIPtin-MetFORMIN HCl (JANUMET XR) 50-1000 MG TB24 TAKE 2 TABLETS EVERY DAY 08/29/22   Kerri Perches, MD    Allergies as of 06/06/2023   (No Known Allergies)    Family History  Problem Relation Age of Onset   Hypertension Mother    Cancer Father        colon    Hypertension Sister    Diabetes Sister    Hypertension Sister    Hypertension Brother    Asthma Brother    Colon cancer Neg Hx     Social History   Socioeconomic History   Marital status: Married    Spouse name: Not on file   Number of children: Not on file   Years of education: Not on file   Highest education level: Not on file  Occupational History   Not on file  Tobacco Use   Smoking status: Never   Smokeless tobacco: Never  Vaping Use   Vaping status: Never Used  Substance and Sexual Activity   Alcohol  use: No   Drug use: No   Sexual activity: Not on file  Other Topics Concern   Not on file  Social History Narrative   Not on file   Social Drivers of Health   Financial Resource Strain: Low Risk  (08/04/2022)   Overall Financial Resource Strain (CARDIA)    Difficulty of Paying Living Expenses: Not hard at all  Food Insecurity: No Food Insecurity (08/04/2022)   Hunger Vital Sign    Worried About Running Out of Food in the Last Year: Never true    Ran Out of Food in the Last Year: Never true  Transportation Needs: No Transportation Needs (08/04/2022)   PRAPARE - Administrator, Civil Service (Medical): No    Lack of Transportation (Non-Medical): No  Physical Activity:  Sufficiently Active (08/04/2022)   Exercise Vital Sign    Days of Exercise per Week: 5 days    Minutes of Exercise per Session: 30 min  Stress: No Stress Concern Present (08/04/2022)   [employer] of Occupational Health - Occupational Stress Questionnaire    Feeling of Stress : Not at all  Social Connections: Socially Integrated (08/04/2022)   Social Connection and Isolation Panel [NHANES]    Frequency of Communication with Friends and Family: More than three times a week    Frequency of Social Gatherings with Friends and Family: More than three times a week    Attends Religious Services: More than 4 times per year    Active Member of Clubs or Organizations: Yes    Attends Banker Meetings: More than 4 times per year    Marital Status: Married  Catering manager Violence: Not At Risk (08/04/2022)   Humiliation, Afraid, Rape, and Kick questionnaire    Fear of Current or Ex-Partner: No    Emotionally Abused: No    Physically Abused: No    Sexually Abused: No    Review of Systems: See HPI, otherwise negative ROS  Physical Exam: Vital signs in last 24 hours: Temp:  [98.4 F (36.9 C)] 98.4 F (36.9 C) (03/28 0821) Pulse Rate:  [78] 78 (03/28 0821) Resp:  [15] 15 (03/28 0821) BP: (128)/(84) 128/84 (03/28 0821) SpO2:  [97 %] 97 % (03/28 0821) Weight:  [78.9 kg] 78.9 kg (03/28 0821)   General:   Alert,  Well-developed, well-nourished, pleasant and cooperative in NAD Head:  Normocephalic and atraumatic. Eyes:  Sclera clear, no icterus.   Conjunctiva pink. Ears:  Normal auditory acuity. Nose:  No deformity, discharge,  or lesions. Msk:  Symmetrical without gross deformities. Normal posture. Extremities:  Without clubbing or edema. Neurologic:  Alert and  oriented x4;  grossly normal neurologically. Skin:  Intact without significant lesions or rashes. Psych:  Alert and cooperative. Normal mood and affect.  Impression/Plan: Daisy Robbins is here for a colonoscopy  to be performed for colon cancer screening purposes.  The risks of the procedure including infection, bleed, or perforation as well as benefits, limitations, alternatives and imponderables have been reviewed with the patient. Questions have been answered. All parties agreeable.

## 2023-09-01 NOTE — Op Note (Signed)
 [facility] Patient Name: Daisy Robbins Procedure Date: 09/01/2023 9:08 AM MRN: [id number] Date of Birth: 15-Dec-1960 Attending MD: Sanjuan Dame , MD, [id number] CSN: [id number] Age: 63 Admit Type: Outpatient Procedure:                Colonoscopy Indications:              Screening for colorectal malignant neoplasm Providers:                Sanjuan Dame, MD, Angelica Ran, Elinor Parkinson Referring MD:              Medicines:                Monitored Anesthesia Care Complications:            No immediate complications. Estimated Blood Loss:     Estimated blood loss: none. Procedure:                Pre-Anesthesia Assessment:                           - Prior to the procedure, a History and Physical                            was performed, and patient medications and                            allergies were reviewed. The patient's tolerance of                            previous anesthesia was also reviewed. The risks                            and benefits of the procedure and the sedation                            options and risks were discussed with the patient.                            All questions were answered, and informed consent                            was obtained. Prior Anticoagulants: The patient has                            taken no anticoagulant or antiplatelet agents. ASA                            Grade Assessment: II - A patient with mild systemic                            disease. After reviewing the risks and benefits,                            the patient was deemed in satisfactory condition to  undergo the procedure.                           After obtaining informed consent, the colonoscope                            was passed under direct vision. Throughout the                            procedure, the patient's blood pressure, pulse, and                            oxygen saturations were monitored continuously. The                             [id number]-[id number] ([id number]) scope was introduced through                            the anus and advanced to the the cecum, identified                            by appendiceal orifice and ileocecal valve. The                            colonoscopy was performed without difficulty. The                            patient tolerated the procedure well. The quality                            of the bowel preparation was evaluated using the                            BBPS [facility] Bowel Preparation Scale) with scores                            of: Right Colon = 3, Transverse Colon = 3 and Left                            Colon = 3 (entire mucosa seen well with no residual                            staining, small fragments of stool or opaque                            liquid). The total BBPS score equals 9. The                            ileocecal valve, appendiceal orifice, and rectum                            were photographed. Scope In: 9:27:05 AM Scope Out: 9:39:19 AM Scope Withdrawal Time: 0 hours 9 minutes 9 seconds  Total Procedure Duration: 0 hours 12 minutes 14 seconds  Findings:      A few small-mouthed diverticula were found in the left colon.      Non-bleeding internal hemorrhoids were found during retroflexion. The       hemorrhoids were small.      The exam was otherwise without abnormality. Impression:               - Diverticulosis in the left colon.                           - Non-bleeding internal hemorrhoids.                           - The examination was otherwise normal.                           - No specimens collected. Moderate Sedation:      Per Anesthesia Care Recommendation:           - Patient has a contact number available for                            emergencies. The signs and symptoms of potential                            delayed complications were discussed with the                            patient. Return to normal activities  tomorrow.                            Written discharge instructions were provided to the                            patient.                           - High fiber diet.                           - Continue present medications.                           - Await pathology results.                           - Repeat colonoscopy in 10 years for screening                            purposes.                           - Return to GI clinic as previously scheduled. Procedure Code(s):        --- Professional ---                           W0981, Colorectal cancer screening; colonoscopy on  individual not meeting criteria for high risk Diagnosis Code(s):        --- Professional ---                           Z12.11, Encounter for screening for malignant                            neoplasm of colon                           K64.8, Other hemorrhoids                           K57.30, Diverticulosis of large intestine without                            perforation or abscess without bleeding CPT copyright 2022 American Medical Association. All rights reserved. The codes documented in this report are preliminary and upon coder review may  be revised to meet current compliance requirements. Sanjuan Dame, MD Sanjuan Dame, MD 09/01/2023 9:50:06 AM This report has been signed electronically. Number of Addenda: 0

## 2023-09-04 ENCOUNTER — Encounter (INDEPENDENT_AMBULATORY_CARE_PROVIDER_SITE_OTHER): Payer: Self-pay | Admitting: Gastroenterology

## 2023-09-04 ENCOUNTER — Encounter (HOSPITAL_COMMUNITY): Payer: Self-pay | Admitting: Gastroenterology

## 2023-09-04 ENCOUNTER — Ambulatory Visit: Payer: Medicare HMO

## 2023-09-04 VITALS — BP 128/84 | Ht 63.0 in | Wt 175.0 lb

## 2023-09-04 DIAGNOSIS — Z0000 Encounter for general adult medical examination without abnormal findings: Secondary | ICD-10-CM

## 2023-09-04 DIAGNOSIS — Z2821 Immunization not carried out because of patient refusal: Secondary | ICD-10-CM

## 2023-09-04 DIAGNOSIS — E118 Type 2 diabetes mellitus with unspecified complications: Secondary | ICD-10-CM

## 2023-09-04 NOTE — Patient Instructions (Signed)
 Ms. Daisy Robbins , Thank you for taking time to come for your Medicare Wellness Visit. I appreciate your ongoing commitment to your health goals. Please review the following plan we discussed and let me know if I can assist you in the future.   Referrals/Orders/Follow-Ups/Clinician Recommendations: follow up in 1 year for AWV.  This is a list of the screening recommended for you and due dates:  Health Maintenance  Topic Date Due   Zoster (Shingles) Vaccine (1 of 2) Never done   Pneumococcal Vaccination (2 of 2 - PCV) 10/31/2014   DTaP/Tdap/Td vaccine (2 - Td or Tdap) 11/02/2021   Yearly kidney health urinalysis for diabetes  06/11/2023   Complete foot exam   06/11/2023   COVID-19 Vaccine (6 - Moderna risk 2024-25 season) 08/13/2023   Pap with HPV screening  04/18/2024*   Hemoglobin A1C  10/04/2023   Yearly kidney function blood test for diabetes  04/04/2024   Eye exam for diabetics  05/28/2024   Mammogram  07/08/2024   Medicare Annual Wellness Visit  09/03/2024   Colon Cancer Screening  08/31/2033   Flu Shot  Completed   Hepatitis C Screening  Completed   HIV Screening  Completed   HPV Vaccine  Aged Out  *Topic was postponed. The date shown is not the original due date.    Advanced directives: (Declined) Advance directive discussed with you today. Even though you declined this today, please call our office should you change your mind, and we can give you the proper paperwork for you to fill out.  Next Medicare Annual Wellness Visit scheduled for next year: Yes

## 2023-09-04 NOTE — Progress Notes (Signed)
 Negative ANA Hepatitis A nonimmune hepatitis B nonexposed nonimmune Hepatitis C negative HIV negative

## 2023-09-04 NOTE — Progress Notes (Signed)
 Because this visit was a virtual/telehealth visit,  certain criteria was not obtained, such a blood pressure, CBG if applicable, and timed get up and go. Any medications not marked as "taking" were not mentioned during the medication reconciliation part of the visit. Any vitals not documented were not able to be obtained due to this being a telehealth visit or patient was unable to self-report a recent blood pressure reading due to a lack of equipment at home via telehealth. Vitals that have been documented are verbally provided by the patient.  Subjective:   Daisy Robbins is a 63 y.o. who presents for a Medicare Wellness preventive visit.  Visit Complete: Virtual I connected with  Lear J Killmer on 09/04/23 by a audio enabled telemedicine application and verified that I am speaking with the correct person using two identifiers.  Patient Location: Home  Provider Location: Home Office  I discussed the limitations of evaluation and management by telemedicine. The patient expressed understanding and agreed to proceed.  Vital Signs: Because this visit was a virtual/telehealth visit, some criteria may be missing or patient reported. Any vitals not documented were not able to be obtained and vitals that have been documented are patient reported.  VideoDeclined- This patient declined Librarian, academic. Therefore the visit was completed with audio only.  Persons Participating in Visit: Patient.  AWV Questionnaire: No: Patient Medicare AWV questionnaire was not completed prior to this visit.  Cardiac Risk Factors include: diabetes mellitus;hypertension;obesity (BMI >30kg/m2)     Objective:    Today's Vitals   09/04/23 1050 09/04/23 1051  BP: 128/84   Weight: 175 lb (79.4 kg)   Height: 5\' 3"  (1.6 m)   PainSc:  6    Body mass index is 31 kg/m.     09/04/2023   10:50 AM 09/01/2023    8:10 AM 08/04/2022   10:13 AM 03/01/2012    8:27 AM  Advanced Directives   Does Patient Have a Medical Advance Directive? No No No Patient does not have advance directive;Patient would not like information  Would patient like information on creating a medical advance directive? No - Patient declined No - Patient declined No - Patient declined     Current Medications (verified) Outpatient Encounter Medications as of 09/04/2023  Medication Sig   albuterol (VENTOLIN HFA) 108 (90 Base) MCG/ACT inhaler Inhale 2 puffs into the lungs every 6 (six) hours as needed for wheezing or shortness of breath.   Blood Glucose Monitoring Suppl DEVI 1 each by Does not apply route in the morning, at noon, and at bedtime. May substitute to any manufacturer covered by patient's insurance.   glipiZIDE (GLUCOTROL XL) 2.5 MG 24 hr tablet TAKE 1 TABLET BY MOUTH EVERY DAY WITH BREAKFAST   latanoprost (XALATAN) 0.005 % ophthalmic solution 1 drop at bedtime.   loratadine (CLARITIN) 10 MG tablet TAKE 1 TABLET BY MOUTH EVERY DAY   montelukast (SINGULAIR) 10 MG tablet TAKE 1 TABLET BY MOUTH EVERYDAY AT BEDTIME   Multiple Vitamin (MULTIVITAMIN) tablet Take 1 tablet by mouth daily.   PROAIR HFA 108 (90 Base) MCG/ACT inhaler Inhale 2 puffs into the lungs every 6 (six) hours as needed for wheezing or shortness of breath.   rosuvastatin (CRESTOR) 5 MG tablet TAKE 1 TABLET EVERY MONDAY AND THURSDAY   SitaGLIPtin-MetFORMIN HCl (JANUMET XR) 50-1000 MG TB24 TAKE 2 TABLETS EVERY DAY   valsartan-hydrochlorothiazide (DIOVAN-HCT) 320-25 MG tablet TAKE 1 TABLET BY MOUTH EVERY DAY   No facility-administered encounter medications  on file as of 09/04/2023.    Allergies (verified) Patient has no known allergies.   History: Past Medical History:  Diagnosis Date   Allergy    Diabetes mellitus    Hypertension    PONV (postoperative nausea and vomiting)    Past Surgical History:  Procedure Laterality Date   ABDOMINAL HYSTERECTOMY     CESAREAN SECTION     COLONOSCOPY  03/01/2012   Procedure: COLONOSCOPY;   Surgeon: Malissa Hippo, MD;  Location: [facility];  Service: Endoscopy;  Laterality: N/A;  950   COLONOSCOPY WITH PROPOFOL N/A 09/01/2023   Procedure: COLONOSCOPY WITH PROPOFOL;  Surgeon: Franky Macho, MD;  Location: [facility];  Service: Endoscopy;  Laterality: N/A;  11:15AM;ASA 1-2   right carpal tunnel release     Family History  Problem Relation Age of Onset   Hypertension Mother    Cancer Father        colon    Hypertension Sister    Diabetes Sister    Hypertension Sister    Hypertension Brother    Asthma Brother    Colon cancer Neg Hx    Social History   Socioeconomic History   Marital status: Married    Spouse name: Not on file   Number of children: Not on file   Years of education: Not on file   Highest education level: Not on file  Occupational History   Not on file  Tobacco Use   Smoking status: Never   Smokeless tobacco: Never  Vaping Use   Vaping status: Never Used  Substance and Sexual Activity   Alcohol use: No   Drug use: No   Sexual activity: Not on file  Other Topics Concern   Not on file  Social History Narrative   Not on file   Social Drivers of Health   Financial Resource Strain: Low Risk  (09/04/2023)   Overall Financial Resource Strain (CARDIA)    Difficulty of Paying Living Expenses: Not hard at all  Food Insecurity: No Food Insecurity (09/04/2023)   Hunger Vital Sign    Worried About Running Out of Food in the Last Year: Never true    Ran Out of Food in the Last Year: Never true  Transportation Needs: No Transportation Needs (09/04/2023)   PRAPARE - Administrator, Civil Service (Medical): No    Lack of Transportation (Non-Medical): No  Physical Activity: Insufficiently Active (09/04/2023)   Exercise Vital Sign    Days of Exercise per Week: 2 days    Minutes of Exercise per Session: 30 min  Stress: No Stress Concern Present (09/04/2023)   [employer] of Occupational Health - Occupational Stress Questionnaire     Feeling of Stress : Not at all  Social Connections: Socially Integrated (09/04/2023)   Social Connection and Isolation Panel [NHANES]    Frequency of Communication with Friends and Family: More than three times a week    Frequency of Social Gatherings with Friends and Family: More than three times a week    Attends Religious Services: More than 4 times per year    Active Member of [employer] or Organizations: Yes    Attends Engineer, structural: More than 4 times per year    Marital Status: Married    Tobacco Counseling Counseling given: Not Answered    Clinical Intake:  Pre-visit preparation completed: Yes  Pain : 0-10 Pain Score: 6  Pain Type: Acute pain Pain Location: Other (Comment) (left big toe  pain) Pain Orientation: Left Pain Descriptors / Indicators: Aching Pain Onset: Today Pain Frequency: Rarely Effect of Pain on Daily Activities: painful to walk on toe     BMI - recorded: 31 Nutritional Status: BMI > 30  Obese Nutritional Risks: None Diabetes: Yes CBG done?: No Did pt. bring in CBG monitor from home?: No  Lab Results  Component Value Date   HGBA1C 7.4 (H) 04/05/2023   HGBA1C 7.1 (H) 09/09/2022   HGBA1C 8.1 (H) 06/10/2022     How often do you need to have someone help you when you read instructions, pamphlets, or other written materials from your doctor or pharmacy?: 1 - Never What is the last grade level you completed in school?: some college  Interpreter Needed?: No  Information entered by :: Amaree Leeper CMA   Activities of Daily Living     09/04/2023   10:57 AM  In your present state of health, do you have any difficulty performing the following activities:  Hearing? 0  Vision? 0  Difficulty concentrating or making decisions? 0  Walking or climbing stairs? 0  Dressing or bathing? 0  Doing errands, shopping? 0  Preparing Food and eating ? N  Using the Toilet? N  In the past six months, have you accidently leaked urine? N  Do you  have problems with loss of bowel control? N  Managing your Medications? N  Managing your Finances? N  Housekeeping or managing your Housekeeping? N    Patient Care Team: Kerri Perches, MD as PCP - General  Indicate any recent Medical Services you may have received from other than [facility] providers in the past year (date may be approximate).     Assessment:   This is a routine wellness examination for Marilea.  Hearing/Vision screen Hearing Screening - Comments:: Patient has no difficulty hearing Vision Screening - Comments:: Patient wears glasses   Goals Addressed               This Visit's Progress     Patient Stated (pt-stated)        Patient will like to travel more       Depression Screen     09/04/2023   10:59 AM 04/25/2023    8:12 AM 01/03/2023    3:00 PM 09/09/2022    9:55 AM 08/04/2022   10:13 AM 08/04/2022   10:12 AM 06/10/2022    8:31 AM  PHQ 2/9 Scores  PHQ - 2 Score 0 0 0 0 0 0 0  PHQ- 9 Score 0          Fall Risk     09/04/2023   10:56 AM 04/25/2023    8:11 AM 01/03/2023    3:00 PM 09/09/2022    9:55 AM 08/04/2022   10:13 AM  Fall Risk   Falls in the past year? 0 0 0 0 0  Number falls in past yr: 0 0 0 0 0  Injury with Fall? 0 0 0 0 0  Risk for fall due to : No Fall Risks No Fall Risks No Fall Risks No Fall Risks No Fall Risks  Follow up Falls prevention discussed;Falls evaluation completed Falls evaluation completed Falls evaluation completed Falls evaluation completed Falls evaluation completed    MEDICARE RISK AT HOME:  Medicare Risk at Home Any stairs in or around the home?: Yes If so, are there any without handrails?: No Home free of loose throw rugs in walkways, pet beds, electrical cords, etc?: Yes Adequate lighting in  your home to reduce risk of falls?: Yes Life alert?: No Use of a cane, walker or w/c?: No Grab bars in the bathroom?: No Shower chair or bench in shower?: No Elevated toilet seat or a handicapped toilet?: No  TIMED  UP AND GO:  Was the test performed?  No  Cognitive Function: 6CIT completed    08/04/2022   10:14 AM  MMSE - Mini Mental State Exam  Not completed: Unable to complete        09/04/2023   10:53 AM 08/04/2022   10:14 AM 06/30/2021    2:57 PM  6CIT Screen  What Year? 0 points 0 points 0 points  What month? 0 points 0 points 0 points  What time? 0 points 0 points 0 points  Count back from 20 0 points 0 points 0 points  Months in reverse 0 points 0 points   Repeat phrase 2 points 0 points   Total Score 2 points 0 points     Immunizations Immunization History  Administered Date(s) Administered   Influenza Whole 02/24/2009, 03/17/2009, 03/08/2010   Influenza, Mdck, Trivalent,PF 6+ MOS(egg free) 02/13/2023   Influenza,inj,Quad PF,6+ Mos 02/28/2013, 06/30/2014, 02/22/2016, 03/25/2018, 03/20/2019, 04/03/2020, 03/03/2021, 06/10/2022   Influenza,inj,quad, With Preservative 03/06/2018   Moderna Covid-19 Fall Seasonal Vaccine 72yrs & older 02/13/2023   Moderna SARS-COV2 Booster Vaccination 02/13/2023   Moderna Sars-Covid-2 Vaccination 08/06/2019, 09/03/2019, 04/06/2020, 03/06/2021   Pneumococcal Polysaccharide-23 10/30/2013   Tdap 11/03/2011    Screening Tests Health Maintenance  Topic Date Due   Zoster Vaccines- Shingrix (1 of 2) Never done   Pneumococcal Vaccine 60-47 Years old (2 of 2 - PCV) 10/31/2014   DTaP/Tdap/Td (2 - Td or Tdap) 11/02/2021   Diabetic kidney evaluation - Urine ACR  06/11/2023   FOOT EXAM  06/11/2023   COVID-19 Vaccine (6 - Moderna risk 2024-25 season) 08/13/2023   Cervical Cancer Screening (HPV/Pap Cotest)  04/18/2024 (Originally 10/30/2016)   HEMOGLOBIN A1C  10/04/2023   Diabetic kidney evaluation - eGFR measurement  04/04/2024   OPHTHALMOLOGY EXAM  05/28/2024   MAMMOGRAM  07/08/2024   Medicare Annual Wellness (AWV)  09/03/2024   Colonoscopy  08/31/2033   INFLUENZA VACCINE  Completed   Hepatitis C Screening  Completed   HIV Screening  Completed   HPV  VACCINES  Aged Out    Health Maintenance  Health Maintenance Due  Topic Date Due   Zoster Vaccines- Shingrix (1 of 2) Never done   Pneumococcal Vaccine 38-19 Years old (2 of 2 - PCV) 10/31/2014   DTaP/Tdap/Td (2 - Td or Tdap) 11/02/2021   Diabetic kidney evaluation - Urine ACR  06/11/2023   FOOT EXAM  06/11/2023   COVID-19 Vaccine (6 - Moderna risk 2024-25 season) 08/13/2023   Health Maintenance Items Addressed: UACR (Urine Albumin:Creatinine Ratio) diabetic foot exam ordered at visit. Patient declined vaccines at this time and states she will be getting the ones due   Additional Screening:  Vision Screening: Recommended annual ophthalmology exams for early detection of glaucoma and other disorders of the eye.  Dental Screening: Recommended annual dental exams for proper oral hygiene  Community Resource Referral / Chronic Care Management: CRR required this visit?  No   CCM required this visit?  No     Plan:     I have personally reviewed and noted the following in the patient's chart:   Medical and social history Use of alcohol, tobacco or illicit drugs  Current medications and supplements including opioid prescriptions. Patient is not  currently taking opioid prescriptions. Functional ability and status Nutritional status Physical activity Advanced directives List of other physicians Hospitalizations, surgeries, and ER visits in previous 12 months Vitals Screenings to include cognitive, depression, and falls Referrals and appointments  In addition, I have reviewed and discussed with patient certain preventive protocols, quality metrics, and best practice recommendations. A written personalized care plan for preventive services as well as general preventive health recommendations were provided to patient.     Rudi Heap, [place]   09/04/2023   After Visit Summary: (MyChart) Due to this being a telephonic visit, the after visit summary with patients personalized  plan was offered to patient via MyChart   Notes: Nothing significant to report at this time.

## 2023-09-05 ENCOUNTER — Encounter: Payer: Self-pay | Admitting: Family Medicine

## 2023-09-05 ENCOUNTER — Ambulatory Visit: Payer: Medicare HMO | Admitting: Family Medicine

## 2023-09-05 VITALS — BP 121/78 | HR 74 | Resp 16 | Ht 63.0 in | Wt 181.0 lb

## 2023-09-05 DIAGNOSIS — E66811 Obesity, class 1: Secondary | ICD-10-CM | POA: Diagnosis not present

## 2023-09-05 DIAGNOSIS — E785 Hyperlipidemia, unspecified: Secondary | ICD-10-CM | POA: Diagnosis not present

## 2023-09-05 DIAGNOSIS — E1159 Type 2 diabetes mellitus with other circulatory complications: Secondary | ICD-10-CM

## 2023-09-05 DIAGNOSIS — Z599 Problem related to housing and economic circumstances, unspecified: Secondary | ICD-10-CM | POA: Diagnosis not present

## 2023-09-05 DIAGNOSIS — Z1231 Encounter for screening mammogram for malignant neoplasm of breast: Secondary | ICD-10-CM

## 2023-09-05 DIAGNOSIS — I10 Essential (primary) hypertension: Secondary | ICD-10-CM | POA: Diagnosis not present

## 2023-09-05 DIAGNOSIS — M79674 Pain in right toe(s): Secondary | ICD-10-CM | POA: Diagnosis not present

## 2023-09-05 DIAGNOSIS — J4550 Severe persistent asthma, uncomplicated: Secondary | ICD-10-CM | POA: Diagnosis not present

## 2023-09-05 NOTE — Assessment & Plan Note (Addendum)
 Reports unable to afford all maintenance inhalers , using rescue up to  3  times daily, will message for pharmacy help

## 2023-09-05 NOTE — Assessment & Plan Note (Addendum)
 Acute first episode on 09/02/2023, no trauma, check uric acid level

## 2023-09-05 NOTE — Assessment & Plan Note (Signed)
 Hyperlipidemia:Low fat diet discussed and encouraged.   Lipid Panel  Lab Results  Component Value Date   CHOL 168 04/05/2023   HDL 80 04/05/2023   LDLCALC 67 04/05/2023   TRIG 121 04/05/2023   CHOLHDL 2.1 04/05/2023     Updated lab needed at/ before next visit.

## 2023-09-05 NOTE — Progress Notes (Signed)
 Daisy Robbins     MRN: [id number]      DOB: 1961-06-02  Chief Complaint  Patient presents with   toe swelling    Follow up and swelling and knot in RT large toe since Saturday which has improved since then but still tender. No known injury.    HPI Daisy Robbins is here for follow up and re-evaluation of chronic medical conditions, medication management and review of any available recent lab and radiology data.  Preventive health is updated, specifically  Cancer screening and Immunization.   Questions or concerns regarding consultations or procedures which the PT has had in the interim are  addressed.Has fatty liver and was advised by GI to eat  greens to correct this, now she is highly motivated to change her diet The PT denies any adverse reactions to current medications since the last visit. Unable to afford maintainace med for asthma and uses proventil every day several times per day, needs help! Concerns as above. Wants referral to nutritionist to better manage her health ROS Denies recent fever or chills. Denies sinus pressure, nasal congestion, ear pain or sore throat. Increased allergy symptoms with wheezing using rescue up to 3 times per day unable to afford maintenance med Denies chest pains, palpitations and leg swelling Denies abdominal pain, nausea, vomiting,diarrhea or constipation.   Denies dysuria, frequency, hesitancy or incontinence. Denies headaches, seizures, numbness, or tingling. Denies depression, anxiety or insomnia. Denies skin break down or rash.   PE  BP 121/78   Pulse 74   Resp 16   Ht 5\' 3"  (1.6 m)   Wt 181 lb 0.6 oz (82.1 kg)   SpO2 96%   BMI 32.07 kg/m   Patient alert and oriented and in no cardiopulmonary distress.  HEENT: No facial asymmetry, EOMI,     Neck supple .  Chest: Clear to auscultation bilaterally.  CVS: S1, S2 no murmurs, no S3.Regular rate.  ABD: Soft non tender.   Ext: No edema  MS: Adequate ROM spine, shoulders, hips and  knees.Tender over IP joint of right great toe  Skin: Intact, no ulcerations or rash noted.  Psych: Good eye contact, normal affect. Memory intact not anxious or depressed appearing.  CNS: CN 2-12 intact, power,  normal throughout.no focal deficits noted.   Assessment & Plan  Essential hypertension, benign Controlled, no change in medication DASH diet and commitment to daily physical activity for a minimum of 30 minutes discussed and encouraged, as a part of hypertension management. The importance of attaining a healthy weight is also discussed.     09/05/2023    9:13 AM 09/04/2023   10:50 AM 09/01/2023    9:43 AM 09/01/2023    8:21 AM 06/06/2023   10:23 AM 06/06/2023   10:20 AM 04/25/2023    8:11 AM  BP/Weight  Systolic BP 121 128 97 128 125 142 124  Diastolic BP 78 84 43 84 81 80 77  Wt. (Lbs) 181.04 175  174  181.3 178.04  BMI 32.07 kg/m2 31 kg/m2  30.82 kg/m2  32.12 kg/m2 31.54 kg/m2       Toe pain, right Acute first episode on 09/02/2023, no trauma, check uric acid level  Type 2 diabetes mellitus with vascular disease (HCC) Diabetes associated with hypertension and obesity Updated lab needed at/ before next visit. Trfer to diabetic ed  Daisy Robbins is reminded of the importance of commitment to daily physical activity for 30 minutes or more, as able and the need to limit  carbohydrate intake to 30 to 60 grams per meal to help with blood sugar control.   The need to take medication as prescribed, test blood sugar as directed, and to call between visits if there is a concern that blood sugar is uncontrolled is also discussed.   Daisy Robbins is reminded of the importance of daily foot exam, annual eye examination, and good blood sugar, blood pressure and cholesterol control.     Latest Ref Rng & Units 04/05/2023    8:17 AM 09/09/2022   11:11 AM 06/10/2022    9:30 AM 11/25/2021   12:06 PM 06/30/2021    8:28 AM  Diabetic Labs  HbA1c 4.8 - 5.6 % 7.4  7.1  8.1  6.8  7.3    Micro/Creat Ratio 0 - 29 mg/g creat   <5     Chol 100 - 199 mg/dL 440   102   725   HDL >36 mg/dL 80   89   78   Calc LDL 0 - 99 mg/dL 67   79   57   Triglycerides 0 - 149 mg/dL 644   75   86   Creatinine 0.57 - 1.00 mg/dL 0.34  7.42  5.95   6.38       09/05/2023    9:13 AM 09/04/2023   10:50 AM 09/01/2023    9:43 AM 09/01/2023    8:21 AM 06/06/2023   10:23 AM 06/06/2023   10:20 AM 04/25/2023    8:11 AM  BP/Weight  Systolic BP 121 128 97 128 125 142 124  Diastolic BP 78 84 43 84 81 80 77  Wt. (Lbs) 181.04 175  174  181.3 178.04  BMI 32.07 kg/m2 31 kg/m2  30.82 kg/m2  32.12 kg/m2 31.54 kg/m2      Latest Ref Rng & Units 05/29/2023   12:00 AM 06/10/2022    8:20 AM  Foot/eye exam completion dates  Eye Exam No Retinopathy No Retinopathy       Foot Form Completion   Done     This result is from an external source.        Severe persistent asthma Reports unable to afford all maintenance inhalers , using rescue up to  3  times daily, will message for pharmacy help  Obesity (BMI 30.0-34.9)  Patient re-educated about  the importance of commitment to a  minimum of 150 minutes of exercise per week as able.  The importance of healthy food choices with portion control discussed, as well as eating regularly and within a 12 hour window most days. The need to choose "clean , green" food 50 to 75% of the time is discussed, as well as to make water the primary drink and set a goal of 64 ounces water daily.       09/05/2023    9:13 AM 09/04/2023   10:50 AM 09/01/2023    8:21 AM  Weight /BMI  Weight 181 lb 0.6 oz 175 lb 174 lb  Height 5\' 3"  (1.6 m) 5\' 3"  (1.6 m) 5\' 3"  (1.6 m)  BMI 32.07 kg/m2 31 kg/m2 30.82 kg/m2    deteriorated  Hyperlipidemia LDL goal <100 Hyperlipidemia:Low fat diet discussed and encouraged.   Lipid Panel  Lab Results  Component Value Date   CHOL 168 04/05/2023   HDL 80 04/05/2023   LDLCALC 67 04/05/2023   TRIG 121 04/05/2023   CHOLHDL 2.1 04/05/2023      Updated lab needed at/ before next visit.

## 2023-09-05 NOTE — Assessment & Plan Note (Addendum)
 Diabetes associated with hypertension and obesity Updated lab needed at/ before next visit. Trfer to diabetic ed  Daisy Robbins is reminded of the importance of commitment to daily physical activity for 30 minutes or more, as able and the need to limit carbohydrate intake to 30 to 60 grams per meal to help with blood sugar control.   The need to take medication as prescribed, test blood sugar as directed, and to call between visits if there is a concern that blood sugar is uncontrolled is also discussed.   Daisy Robbins is reminded of the importance of daily foot exam, annual eye examination, and good blood sugar, blood pressure and cholesterol control.     Latest Ref Rng & Units 04/05/2023    8:17 AM 09/09/2022   11:11 AM 06/10/2022    9:30 AM 11/25/2021   12:06 PM 06/30/2021    8:28 AM  Diabetic Labs  HbA1c 4.8 - 5.6 % 7.4  7.1  8.1  6.8  7.3   Micro/Creat Ratio 0 - 29 mg/g creat   <5     Chol 100 - 199 mg/dL 161   096   045   HDL >40 mg/dL 80   89   78   Calc LDL 0 - 99 mg/dL 67   79   57   Triglycerides 0 - 149 mg/dL 981   75   86   Creatinine 0.57 - 1.00 mg/dL 1.91  4.78  2.95   6.21       09/05/2023    9:13 AM 09/04/2023   10:50 AM 09/01/2023    9:43 AM 09/01/2023    8:21 AM 06/06/2023   10:23 AM 06/06/2023   10:20 AM 04/25/2023    8:11 AM  BP/Weight  Systolic BP 121 128 97 128 125 142 124  Diastolic BP 78 84 43 84 81 80 77  Wt. (Lbs) 181.04 175  174  181.3 178.04  BMI 32.07 kg/m2 31 kg/m2  30.82 kg/m2  32.12 kg/m2 31.54 kg/m2      Latest Ref Rng & Units 05/29/2023   12:00 AM 06/10/2022    8:20 AM  Foot/eye exam completion dates  Eye Exam No Retinopathy No Retinopathy       Foot Form Completion   Done     This result is from an external source.

## 2023-09-05 NOTE — Assessment & Plan Note (Signed)
 Controlled, no change in medication DASH diet and commitment to daily physical activity for a minimum of 30 minutes discussed and encouraged, as a part of hypertension management. The importance of attaining a healthy weight is also discussed.     09/05/2023    9:13 AM 09/04/2023   10:50 AM 09/01/2023    9:43 AM 09/01/2023    8:21 AM 06/06/2023   10:23 AM 06/06/2023   10:20 AM 04/25/2023    8:11 AM  BP/Weight  Systolic BP 121 128 97 128 125 142 124  Diastolic BP 78 84 43 84 81 80 77  Wt. (Lbs) 181.04 175  174  181.3 178.04  BMI 32.07 kg/m2 31 kg/m2  30.82 kg/m2  32.12 kg/m2 31.54 kg/m2

## 2023-09-05 NOTE — Patient Instructions (Addendum)
 Annual exam in 13 weeks, call if yiouu need me sooner  Labs today including urine before you leave, results will be sent   Please schedule mammogram at checkout    Good foot exam  Pls get Shingrix vaccines at your pharmacy also TdAP  Nurse pls get immunization update from [facility]  You are referred to nutrition ed, BEST decision made for improved health  Goal for fasting blood sugar ranges from 80 to 120 and 2 hours after any meal or at bedtime should be between 130 to 170.   It is important that you exercise regularly at least 30 minutes 5 times a week. If you develop chest pain, have severe difficulty breathing, or feel very tired, stop exercising immediately and seek medical attention   Thanks for choosing  [facility], we consider it a privelige to serve you.

## 2023-09-05 NOTE — Assessment & Plan Note (Signed)
  Patient re-educated about  the importance of commitment to a  minimum of 150 minutes of exercise per week as able.  The importance of healthy food choices with portion control discussed, as well as eating regularly and within a 12 hour window most days. The need to choose "clean , green" food 50 to 75% of the time is discussed, as well as to make water the primary drink and set a goal of 64 ounces water daily.       09/05/2023    9:13 AM 09/04/2023   10:50 AM 09/01/2023    8:21 AM  Weight /BMI  Weight 181 lb 0.6 oz 175 lb 174 lb  Height 5\' 3"  (1.6 m) 5\' 3"  (1.6 m) 5\' 3"  (1.6 m)  BMI 32.07 kg/m2 31 kg/m2 30.82 kg/m2    deteriorated

## 2023-09-06 ENCOUNTER — Encounter: Payer: Self-pay | Admitting: Family Medicine

## 2023-09-07 LAB — CMP14+EGFR
ALT: 22 IU/L (ref 0–32)
AST: 22 IU/L (ref 0–40)
Albumin: 4.4 g/dL (ref 3.9–4.9)
Alkaline Phosphatase: 139 IU/L — ABNORMAL HIGH (ref 44–121)
BUN/Creatinine Ratio: 9 — ABNORMAL LOW (ref 12–28)
BUN: 8 mg/dL (ref 8–27)
Bilirubin Total: <0.2 mg/dL (ref 0.0–1.2)
CO2: 29 mmol/L (ref 20–29)
Calcium: 10.3 mg/dL (ref 8.7–10.3)
Chloride: 98 mmol/L (ref 96–106)
Creatinine, Ser: 0.9 mg/dL (ref 0.57–1.00)
Globulin, Total: 3.1 g/dL (ref 1.5–4.5)
Glucose: 129 mg/dL — ABNORMAL HIGH (ref 70–99)
Potassium: 3.7 mmol/L (ref 3.5–5.2)
Sodium: 143 mmol/L (ref 134–144)
Total Protein: 7.5 g/dL (ref 6.0–8.5)
eGFR: 71 mL/min/{1.73_m2} (ref >59)

## 2023-09-07 LAB — MICROALBUMIN / CREATININE URINE RATIO: Creatinine, Urine: 69.9 mg/dL

## 2023-09-07 LAB — URIC ACID: Uric Acid: 5.6 mg/dL (ref 3.0–7.2)

## 2023-09-07 LAB — HEMOGLOBIN A1C
Est. average glucose Bld gHb Est-mCnc: 157 mg/dL
Hgb A1c MFr Bld: 7.1 % — ABNORMAL HIGH (ref 4.8–5.6)

## 2023-09-08 ENCOUNTER — Other Ambulatory Visit: Payer: Self-pay | Admitting: Family Medicine

## 2023-09-08 ENCOUNTER — Ambulatory Visit: Payer: Self-pay

## 2023-09-08 MED ORDER — PROMETHAZINE-DM 6.25-15 MG/5ML PO SYRP: ORAL_SOLUTION | ORAL | 0 refills | Status: DC

## 2023-09-08 MED ORDER — FLUTICASONE PROPIONATE 50 MCG/ACT NA SUSP: 2.0000 | Freq: Every day | NASAL | 6 refills | Status: DC

## 2023-09-08 MED ORDER — BENZONATATE 200 MG PO CAPS: 200.0000 mg | ORAL_CAPSULE | Freq: Two times a day (BID) | ORAL | 0 refills | Status: DC | PRN

## 2023-09-08 MED ORDER — AZELASTINE HCL 0.1 % NA SOLN: 1.0000 | Freq: Two times a day (BID) | NASAL | 12 refills | Status: AC

## 2023-09-08 MED ORDER — ALBUTEROL SULFATE HFA 108 (90 BASE) MCG/ACT IN AERS: 2.0000 | INHALATION_SPRAY | Freq: Four times a day (QID) | RESPIRATORY_TRACT | 3 refills | Status: AC | PRN

## 2023-09-08 NOTE — Telephone Encounter (Signed)
Left message to call back about symptoms. °

## 2023-09-08 NOTE — Telephone Encounter (Signed)
 Copied from CRM [id number]. Topic: Clinical - Medication Refill >> Sep 08, 2023  9:16 AM Elmarie Shiley S wrote: Most Recent Primary Care Visit:  Provider: Kerri Perches  Department: [facility]  Visit Type: OFFICE VISIT  Date: 09/05/2023  Medication:  Promethazine 6.25-15mg /47mL  Has the patient contacted their pharmacy? Yes (Agent: If no, request that the patient contact the pharmacy for the refill. If patient does not wish to contact the pharmacy document the reason why and proceed with request.) (Agent: If yes, when and what did the pharmacy advise?)  Is this the correct pharmacy for this prescription? Yes If no, delete pharmacy and type the correct one.  This is the patient's preferred pharmacy:  [id number] [place], [place] - [address] AT [facility] [address]  [place] [address] Phone: [phone] Fax: [phone]    Has the prescription been filled recently? No  Is the patient out of the medication? Yes  Has the patient been seen for an appointment in the last year OR does the patient have an upcoming appointment? Yes  Can we respond through MyChart? Yes  Agent: Please be advised that Rx refills may take up to 3 business days. We ask that you follow-up with your pharmacy.

## 2023-09-08 NOTE — Telephone Encounter (Signed)
 Copied from CRM [id number]. Topic: Clinical - Medication Refill >> Sep 08, 2023  9:11 AM Elmarie Shiley S wrote: Most Recent Primary Care Visit:  Provider: Kerri Perches  Department: [facility]  Visit Type: OFFICE VISIT  Date: 09/05/2023  Medication: albuterol (VENTOLIN HFA) 108 (90 Base) MCG/ACT inhaler [[id number]]   Has the patient contacted their pharmacy? Yes (Agent: If no, request that the patient contact the pharmacy for the refill. If patient does not wish to contact the pharmacy document the reason why and proceed with request.) (Agent: If yes, when and what did the pharmacy advise?)  Is this the correct pharmacy for this prescription? Yes If no, delete pharmacy and type the correct one.  This is the patient's preferred pharmacy:  [id number] [place],  - [address] AT [facility] [address] [place]  [address] Phone: [phone] Fax: [phone]   Has the prescription been filled recently? Yes  Is the patient out of the medication? Yes  Has the patient been seen for an appointment in the last year OR does the patient have an upcoming appointment? Yes  Can we respond through MyChart? Yes  Agent: Please be advised that Rx refills may take up to 3 business days. We ask that you follow-up with your pharmacy.

## 2023-09-08 NOTE — Telephone Encounter (Addendum)
  E2C2 has called three time and left voice mails. Pt is asking for cough syrup and refill on albuterol. Please advise.        Copied from CRM [id number]. Topic: Clinical - Medication Question >> Sep 08, 2023  9:12 AM Marland Kitchen D wrote: Patient wants a refill for albuterol and cough syrup  It wouldn't let me pend the albuterol    Preferred Pharmacy  [id number] [place], [place] - [address] AT [facility] [address] [place] [address] Phone: [phone] Fax: [phone] Hours: Not open 24 hours

## 2023-09-08 NOTE — Telephone Encounter (Signed)
 Flonase , astelin and tessalon perles are prescribed and phenrgand DM, need to be faxed, willl niot go through electronically. Pls fax next week and letpt know

## 2023-09-08 NOTE — Telephone Encounter (Signed)
 Copied from CRM [id number]. Topic: Clinical - Medication Refill >> Sep 08, 2023  9:05 AM Marland Kitchen D wrote: Most Recent Primary Care Visit:  Provider: Syliva Overman E  Department: [facility]  Visit Type: OFFICE VISIT  Date: 09/05/2023  Medication: albuterol and cough syrup    Has the patient contacted their pharmacy? No (Agent: If no, request that the patient contact the pharmacy for the refill. If patient does not wish to contact the pharmacy document the reason why and proceed with request.) (Agent: If yes, when and what did the pharmacy advise?)  Is this the correct pharmacy for this prescription? Yes If no, delete pharmacy and type the correct one.  This is the patient's preferred pharmacy:  [id number] [place], [place] - [address] AT [facility] [address] [place] [address] Phone: [phone] Fax: [phone]    Has the prescription been filled recently? No  Is the patient out of the medication? No almost out  Has the patient been seen for an appointment in the last year OR does the patient have an upcoming appointment? Yes  Can we respond through MyChart? No  Agent: Please be advised that Rx refills may take up to 3 business days. We ask that you follow-up with your pharmacy.

## 2023-09-08 NOTE — Telephone Encounter (Signed)
 Pt states she has been experiencing cough and mild drainage from the pollen. Denies fever or congestion. She is asking if an allergy or cough medication can be called in to the pharmacy. Please advise

## 2023-09-10 ENCOUNTER — Encounter: Payer: Self-pay | Admitting: Family Medicine

## 2023-09-11 NOTE — Telephone Encounter (Signed)
 [facility] informed

## 2023-09-12 ENCOUNTER — Other Ambulatory Visit: Payer: Self-pay

## 2023-09-12 MED ORDER — FLUTICASONE PROPIONATE 50 MCG/ACT NA SUSP: 2.0000 | Freq: Every day | NASAL | 6 refills | Status: AC

## 2023-09-12 MED ORDER — PROMETHAZINE-DM 6.25-15 MG/5ML PO SYRP: ORAL_SOLUTION | ORAL | 0 refills | Status: DC

## 2023-09-13 NOTE — Progress Notes (Signed)
 Copy of visit created due to coding query requesting visit be routed to appropriate provider for attestation  Because this visit was a virtual/telehealth visit,  certain criteria was not obtained, such a blood pressure, CBG if applicable, and timed get up and go. Any medications not marked as "taking" were not mentioned during the medication reconciliation part of the visit. Any vitals not documented were not able to be obtained due to this being a telehealth visit or patient was unable to self-report a recent blood pressure reading due to a lack of equipment at home via telehealth. Vitals that have been documented are verbally provided by the patient.  Subjective:   Daisy GUTTIERREZ is a 63 y.o. who presents for a Medicare Wellness preventive visit.  Visit Complete: Virtual I connected with  Kebra J Reinitz on 09/13/23 by a audio enabled telemedicine application and verified that I am speaking with the correct person using two identifiers.  Patient Location: Home  Provider Location: Home Office  I discussed the limitations of evaluation and management by telemedicine. The patient expressed understanding and agreed to proceed.  Vital Signs: Because this visit was a virtual/telehealth visit, some criteria may be missing or patient reported. Any vitals not documented were not able to be obtained and vitals that have been documented are patient reported.  VideoDeclined- This patient declined Librarian, academic. Therefore the visit was completed with audio only.  Persons Participating in Visit: Patient.  AWV Questionnaire: No: Patient Medicare AWV questionnaire was not completed prior to this visit.  Cardiac Risk Factors include: diabetes mellitus;hypertension;obesity (BMI >30kg/m2)     Objective:    Today's Vitals   09/04/23 1050 09/04/23 1051  BP: 128/84   Weight: 175 lb (79.4 kg)   Height: 5\' 3"  (1.6 m)   PainSc:  6    Body mass index is 31 kg/m.      09/04/2023   10:50 AM 09/01/2023    8:10 AM 08/04/2022   10:13 AM 03/01/2012    8:27 AM  Advanced Directives  Does Patient Have a Medical Advance Directive? No No No Patient does not have advance directive;Patient would not like information  Would patient like information on creating a medical advance directive? No - Patient declined No - Patient declined No - Patient declined     Current Medications (verified) Outpatient Encounter Medications as of 09/04/2023  Medication Sig   Blood Glucose Monitoring Suppl DEVI 1 each by Does not apply route in the morning, at noon, and at bedtime. May substitute to any manufacturer covered by patient's insurance.   glipiZIDE (GLUCOTROL XL) 2.5 MG 24 hr tablet TAKE 1 TABLET BY MOUTH EVERY DAY WITH BREAKFAST   latanoprost (XALATAN) 0.005 % ophthalmic solution 1 drop at bedtime.   loratadine (CLARITIN) 10 MG tablet TAKE 1 TABLET BY MOUTH EVERY DAY   montelukast (SINGULAIR) 10 MG tablet TAKE 1 TABLET BY MOUTH EVERYDAY AT BEDTIME   Multiple Vitamin (MULTIVITAMIN) tablet Take 1 tablet by mouth daily.   PROAIR HFA 108 (90 Base) MCG/ACT inhaler Inhale 2 puffs into the lungs every 6 (six) hours as needed for wheezing or shortness of breath.   rosuvastatin (CRESTOR) 5 MG tablet TAKE 1 TABLET EVERY MONDAY AND THURSDAY   SitaGLIPtin-MetFORMIN HCl (JANUMET XR) 50-1000 MG TB24 TAKE 2 TABLETS EVERY DAY   valsartan-hydrochlorothiazide (DIOVAN-HCT) 320-25 MG tablet TAKE 1 TABLET BY MOUTH EVERY DAY   [DISCONTINUED] albuterol (VENTOLIN HFA) 108 (90 Base) MCG/ACT inhaler Inhale 2 puffs into the lungs  every 6 (six) hours as needed for wheezing or shortness of breath.   No facility-administered encounter medications on file as of 09/04/2023.    Allergies (verified) Patient has no known allergies.   History: Past Medical History:  Diagnosis Date   Allergy    Diabetes mellitus    Hypertension    PONV (postoperative nausea and vomiting)    Past Surgical History:   Procedure Laterality Date   ABDOMINAL HYSTERECTOMY     CESAREAN SECTION     COLONOSCOPY  03/01/2012   Procedure: COLONOSCOPY;  Surgeon: Malissa Hippo, MD;  [facility];  Service: Endoscopy;  Laterality: N/A;  950   COLONOSCOPY WITH PROPOFOL N/A 09/01/2023   Procedure: COLONOSCOPY WITH PROPOFOL;  Surgeon: Franky Macho, MD;  Location: [facility];  Service: Endoscopy;  Laterality: N/A;  11:15AM;ASA 1-2   right carpal tunnel release     Family History  Problem Relation Age of Onset   Hypertension Mother    Cancer Father        colon    Hypertension Sister    Diabetes Sister    Hypertension Sister    Hypertension Brother    Asthma Brother    Colon cancer Neg Hx    Social History   Socioeconomic History   Marital status: Married    Spouse name: Not on file   Number of children: Not on file   Years of education: Not on file   Highest education level: Not on file  Occupational History   Not on file  Tobacco Use   Smoking status: Never   Smokeless tobacco: Never  Vaping Use   Vaping status: Never Used  Substance and Sexual Activity   Alcohol use: No   Drug use: No   Sexual activity: Not on file  Other Topics Concern   Not on file  Social History Narrative   Not on file   Social Drivers of Health   Financial Resource Strain: Low Risk  (09/04/2023)   Overall Financial Resource Strain (CARDIA)    Difficulty of Paying Living Expenses: Not hard at all  Food Insecurity: No Food Insecurity (09/04/2023)   Hunger Vital Sign    Worried About Running Out of Food in the Last Year: Never true    Ran Out of Food in the Last Year: Never true  Transportation Needs: No Transportation Needs (09/04/2023)   PRAPARE - Administrator, Civil Service (Medical): No    Lack of Transportation (Non-Medical): No  Physical Activity: Insufficiently Active (09/04/2023)   Exercise Vital Sign    Days of Exercise per Week: 2 days    Minutes of Exercise per Session: 30  min  Stress: No Stress Concern Present (09/04/2023)   [employer] of Occupational Health - Occupational Stress Questionnaire    Feeling of Stress : Not at all  Social Connections: Socially Integrated (09/04/2023)   Social Connection and Isolation Panel [NHANES]    Frequency of Communication with Friends and Family: More than three times a week    Frequency of Social Gatherings with Friends and Family: More than three times a week    Attends Religious Services: More than 4 times per year    Active Member of [employer] or Organizations: Yes    Attends Engineer, structural: More than 4 times per year    Marital Status: Married    Tobacco Counseling Counseling given: Not Answered    Clinical Intake:  Pre-visit preparation completed: Yes  Pain : 0-10 Pain Score: 6  Pain Type: Acute pain Pain Location: Other (Comment) (left big toe pain) Pain Orientation: Left Pain Descriptors / Indicators: Aching Pain Onset: Today Pain Frequency: Rarely Effect of Pain on Daily Activities: painful to walk on toe     BMI - recorded: 31 Nutritional Status: BMI > 30  Obese Nutritional Risks: None Diabetes: Yes CBG done?: No Did pt. bring in CBG monitor from home?: No  Lab Results  Component Value Date   HGBA1C 7.1 (H) 09/05/2023   HGBA1C 7.4 (H) 04/05/2023   HGBA1C 7.1 (H) 09/09/2022     How often do you need to have someone help you when you read instructions, pamphlets, or other written materials from your doctor or pharmacy?: 1 - Never What is the last grade level you completed in school?: some college  Interpreter Needed?: No  Information entered by :: Caedence Snowden CMA   Activities of Daily Living     09/04/2023   10:57 AM  In your present state of health, do you have any difficulty performing the following activities:  Hearing? 0  Vision? 0  Difficulty concentrating or making decisions? 0  Walking or climbing stairs? 0  Dressing or bathing? 0  Doing errands,  shopping? 0  Preparing Food and eating ? N  Using the Toilet? N  In the past six months, have you accidently leaked urine? N  Do you have problems with loss of bowel control? N  Managing your Medications? N  Managing your Finances? N  Housekeeping or managing your Housekeeping? N    Patient Care Team: Kerri Perches, MD as PCP - General  Indicate any recent Medical Services you may have received from other than [facility] providers in the past year (date may be approximate).     Assessment:   This is a routine wellness examination for Auset.  Hearing/Vision screen Hearing Screening - Comments:: Patient has no difficulty hearing Vision Screening - Comments:: Patient wears glasses   Goals Addressed               This Visit's Progress     Patient Stated (pt-stated)        Patient will like to travel more       Depression Screen     09/05/2023    9:15 AM 09/04/2023   10:59 AM 04/25/2023    8:12 AM 01/03/2023    3:00 PM 09/09/2022    9:55 AM 08/04/2022   10:13 AM 08/04/2022   10:12 AM  PHQ 2/9 Scores  PHQ - 2 Score 0 0 0 0 0 0 0  PHQ- 9 Score 0 0         Fall Risk     09/05/2023    9:14 AM 09/04/2023   10:56 AM 04/25/2023    8:11 AM 01/03/2023    3:00 PM 09/09/2022    9:55 AM  Fall Risk   Falls in the past year? 0 0 0 0 0  Number falls in past yr: 0 0 0 0 0  Injury with Fall? 0 0 0 0 0  Risk for fall due to :  No Fall Risks No Fall Risks No Fall Risks No Fall Risks  Follow up  Falls prevention discussed;Falls evaluation completed Falls evaluation completed Falls evaluation completed Falls evaluation completed    MEDICARE RISK AT HOME:  Medicare Risk at Home Any stairs in or around the home?: Yes If so, are there any without handrails?: No Home free  of loose throw rugs in walkways, pet beds, electrical cords, etc?: Yes Adequate lighting in your home to reduce risk of falls?: Yes Life alert?: No Use of a cane, walker or w/c?: No Grab bars in the bathroom?:  No Shower chair or bench in shower?: No Elevated toilet seat or a handicapped toilet?: No  TIMED UP AND GO:  Was the test performed?  No  Cognitive Function: 6CIT completed    08/04/2022   10:14 AM  MMSE - Mini Mental State Exam  Not completed: Unable to complete        09/04/2023   10:53 AM 08/04/2022   10:14 AM 06/30/2021    2:57 PM  6CIT Screen  What Year? 0 points 0 points 0 points  What month? 0 points 0 points 0 points  What time? 0 points 0 points 0 points  Count back from 20 0 points 0 points 0 points  Months in reverse 0 points 0 points   Repeat phrase 2 points 0 points   Total Score 2 points 0 points     Immunizations Immunization History  Administered Date(s) Administered   Influenza Whole 02/24/2009, 03/17/2009, 03/08/2010   Influenza, Mdck, Trivalent,PF 6+ MOS(egg free) 02/13/2023   Influenza,inj,Quad PF,6+ Mos 02/28/2013, 06/30/2014, 02/22/2016, 03/25/2018, 03/20/2019, 04/03/2020, 03/03/2021, 06/10/2022   Influenza,inj,quad, With Preservative 03/06/2018   Moderna Covid-19 Fall Seasonal Vaccine 100yrs & older 02/13/2023   Moderna SARS-COV2 Booster Vaccination 02/13/2023   Moderna Sars-Covid-2 Vaccination 08/06/2019, 09/03/2019, 04/06/2020, 03/06/2021   Pneumococcal Polysaccharide-23 10/30/2013   Tdap 11/03/2011    Screening Tests Health Maintenance  Topic Date Due   Zoster Vaccines- Shingrix (1 of 2) Never done   Pneumococcal Vaccine 43-99 Years old (2 of 2 - PCV) 10/31/2014   DTaP/Tdap/Td (2 - Td or Tdap) 11/02/2021   COVID-19 Vaccine (6 - Moderna risk 2024-25 season) 08/13/2023   Cervical Cancer Screening (HPV/Pap Cotest)  04/18/2024 (Originally 10/30/2016)   INFLUENZA VACCINE  01/05/2024   HEMOGLOBIN A1C  03/06/2024   OPHTHALMOLOGY EXAM  05/28/2024   MAMMOGRAM  07/08/2024   Medicare Annual Wellness (AWV)  09/03/2024   Diabetic kidney evaluation - eGFR measurement  09/04/2024   Diabetic kidney evaluation - Urine ACR  09/04/2024   FOOT EXAM   09/10/2024   Colonoscopy  08/31/2033   Hepatitis C Screening  Completed   HIV Screening  Completed   HPV VACCINES  Aged Out    Health Maintenance  Health Maintenance Due  Topic Date Due   Zoster Vaccines- Shingrix (1 of 2) Never done   Pneumococcal Vaccine 26-24 Years old (2 of 2 - PCV) 10/31/2014   DTaP/Tdap/Td (2 - Td or Tdap) 11/02/2021   COVID-19 Vaccine (6 - Moderna risk 2024-25 season) 08/13/2023   Health Maintenance Items Addressed: UACR (Urine Albumin:Creatinine Ratio) diabetic foot exam ordered at visit. Patient declined vaccines at this time and states she will be getting the ones due   Additional Screening:  Vision Screening: Recommended annual ophthalmology exams for early detection of glaucoma and other disorders of the eye.  Dental Screening: Recommended annual dental exams for proper oral hygiene  Community Resource Referral / Chronic Care Management: CRR required this visit?  No   CCM required this visit?  No     Plan:     I have personally reviewed and noted the following in the patient's chart:   Medical and social history Use of alcohol, tobacco or illicit drugs  Current medications and supplements including opioid prescriptions. Patient is not currently  taking opioid prescriptions. Functional ability and status Nutritional status Physical activity Advanced directives List of other physicians Hospitalizations, surgeries, and ER visits in previous 12 months Vitals Screenings to include cognitive, depression, and falls Referrals and appointments  In addition, I have reviewed and discussed with patient certain preventive protocols, quality metrics, and best practice recommendations. A written personalized care plan for preventive services as well as general preventive health recommendations were provided to patient.     Rudi Heap, [place]   09/13/2023   After Visit Summary: (MyChart) Due to this being a telephonic visit, the after visit  summary with patients personalized plan was offered to patient via MyChart   Notes: Nothing significant to report at this time.

## 2023-09-14 ENCOUNTER — Telehealth: Payer: Self-pay

## 2023-09-14 NOTE — Progress Notes (Signed)
 Care Guide Pharmacy Note  09/14/2023 Name: Daisy Robbins MRN: [id number] DOB: [birth year]  Referred By: Kerri Perches, MD Reason for referral: Complex Care Management (Outreach to schedule with pharm d )   Daisy Robbins is a 63 y.o. year old female who is a primary care patient of Kerri Perches, MD.  Santo Held Davern was referred to the pharmacist for assistance related to:  asthma  Successful contact was made with the patient to discuss pharmacy services including being ready for the pharmacist to call at least 5 minutes before the scheduled appointment time and to have medication bottles and any blood pressure readings ready for review. The patient agreed to meet with the pharmacist via telephone visit on (date/time).09/18/2023  Penne Lash , RMA     [place]  [facility], [facility] Guide  Direct Dial: [phone]  Website: [email or web address]

## 2023-09-18 ENCOUNTER — Other Ambulatory Visit: Payer: Self-pay

## 2023-09-18 NOTE — Progress Notes (Unsigned)
   09/18/2023  Patient ID: Daisy Robbins, female   DOB: [birth year], 63 y.o.   MRN: [id number]    09/18/2023 Name: Daisy Robbins MRN: [id number] DOB: 1960/07/13  Chief Complaint  Patient presents with   Medication Management    {Visit Type:26650}   Subjective:  Care Team: Primary Care Provider: Towanda Fret, MD ; Next Scheduled Visit: *** {careteamprovider:27366}  Medication Access/Adherence  [phone] [place], [place] - [address] AT [facility] [address] [place] [address] Phone: [phone] Fax: [phone]  [facility] - [place], [place] - [address] [place] [address] Phone: [phone] Fax: [phone]   Patient reports affordability concerns with their medications: {YES/NO:21197} Patient reports access/transportation concerns to their pharmacy: {YES/NO:21197} Patient reports adherence concerns with their medications:  {YES/NO:21197} ***   {Pharmacy S/O Choices:26420}   Objective:  Lab Results  Component Value Date   HGBA1C 7.1 (H) 09/05/2023    Lab Results  Component Value Date   CREATININE 0.90 09/05/2023   BUN 8 09/05/2023   NA 143 09/05/2023   K 3.7 09/05/2023   CL 98 09/05/2023   CO2 29 09/05/2023    Lab Results  Component Value Date   CHOL 168 04/05/2023   HDL 80 04/05/2023   LDLCALC 67 04/05/2023   TRIG 121 04/05/2023   CHOLHDL 2.1 04/05/2023    Medications Reviewed Today   Medications were not reviewed in this encounter       Assessment/Plan:   {Pharmacy A/P Choices:26421}  Follow Up Plan: ***  ***

## 2023-09-22 ENCOUNTER — Ambulatory Visit (HOSPITAL_COMMUNITY)
Admission: RE | Admit: 2023-09-22 | Discharge: 2023-09-22 | Disposition: A | Discharge status: Home / Self Care | Source: Ambulatory Visit | Attending: Family Medicine | Admitting: Family Medicine

## 2023-09-22 DIAGNOSIS — Z1231 Encounter for screening mammogram for malignant neoplasm of breast: Secondary | ICD-10-CM

## 2023-09-25 ENCOUNTER — Encounter: Payer: Self-pay | Admitting: Internal Medicine

## 2023-09-25 ENCOUNTER — Ambulatory Visit: Admitting: Nutrition

## 2023-09-25 ENCOUNTER — Telehealth: Payer: Self-pay

## 2023-09-25 ENCOUNTER — Telehealth (INDEPENDENT_AMBULATORY_CARE_PROVIDER_SITE_OTHER): Admitting: Internal Medicine

## 2023-09-25 DIAGNOSIS — J4551 Severe persistent asthma with (acute) exacerbation: Secondary | ICD-10-CM | POA: Diagnosis not present

## 2023-09-25 MED ORDER — METHYLPREDNISOLONE 4 MG PO TBPK: ORAL_TABLET | ORAL | 0 refills | Status: DC

## 2023-09-25 MED ORDER — AZITHROMYCIN 250 MG PO TABS: ORAL_TABLET | ORAL | 0 refills | Status: AC

## 2023-09-25 MED ORDER — IPRATROPIUM-ALBUTEROL 0.5-2.5 (3) MG/3ML IN SOLN: RESPIRATORY_TRACT | 0 refills | Status: AC

## 2023-09-25 NOTE — Assessment & Plan Note (Addendum)
 Has recent worsening of cough, dyspnea and wheezing Started Medrol  Dosepak Started empiric azithromycin  considering possibility of acute sinusitis as well Albuterol  as needed for dyspnea or wheezing Prescribed DuoNeb for ease of use and better response Needs a maintenance inhaler, Cost is a concerning factor Coricidin or Robitussin as needed for cough

## 2023-09-25 NOTE — Patient Instructions (Signed)
 Please start using DuoNeb about 3 times in a day for shortness of breath or wheezing.  Please start taking azithromycin  and prednisone  as prescribed.  Please take promethazine -DM syrup as needed for cough.

## 2023-09-25 NOTE — Progress Notes (Signed)
 Pharmacy Medication Assistance Program Note    10/05/2023  Patient ID: Enya J Jain, female   DOB: Feb 25, 1961, 63 y.o.   MRN: [id number]     09/25/2023  Outreach Medication One  Manufacturer Medication One Merck  Merck Drugs Janumet   Type of Radiographer, therapeutic Assistance  Date Application Sent to Patient 09/26/2023  Application Items Requested Application;Proof of Income  Date Application Received From Provider 10/04/2023     Mailed to patient home

## 2023-09-25 NOTE — Progress Notes (Signed)
 Pharmacy Medication Assistance Program Note    09/26/2023  Patient ID: Daisy Robbins, female  DOB: 1961-02-19, 63 y.o.  MRN:  [id number]     09/25/2023  Outreach Medication Two  Manufacturer Medication Two Glaxo/Smith/Kline (GSK)  GlaxoSmithKline (GSK) Drugs Breo  Type of Assistance Manufacturer Assistance  Date Application Sent to Patient 09/26/2023  Application Items Requested Application;Proof of Income;Proof of Out of Pocket Spend     To qualify for assistance patient must have spent $600 out of pocket on medication costs.   Mailed to pt home.

## 2023-09-25 NOTE — Progress Notes (Signed)
 Virtual Visit via Video Note   Because of Daisy Robbins's co-morbid illnesses, she is at least at moderate risk for complications without adequate follow up.  This format is felt to be most appropriate for this patient at this time.  All issues noted in this document were discussed and addressed.  A limited physical exam was performed with this format.      Evaluation Performed:  Follow-up visit  Date:  09/25/2023   ID:  Daisy Robbins, DOB May 23, 1961, MRN [id number]  Patient Location: Home Provider Location: Office/Clinic  Participants: Patient Location of Patient: Home Location of Provider: Telehealth Consent was obtain for visit to be over via telehealth. I verified that I am speaking with the correct person using two identifiers.  PCP:  Towanda Fret, MD   Chief Complaint: Nasal congestion and dyspnea  History of Present Illness:    Daisy Robbins is a 63 y.o. female with PMH of asthma who has a video visit for c/o nasal congestion, cough and recent worsening of dyspnea for the last 1 weeks.  Denies fever or chills.  She has been using albuterol  inhaler about 3-4 times a day with some relief.  She has albuterol  nebulizer as well, but has more used it recently.  She is supposed to get Breo as maintenance inhaler, but has had difficulty getting it due to cost concern.  The patient does not have symptoms concerning for COVID-19 infection (fever, chills, cough, or new shortness of breath).   Past Medical, Surgical, Social History, Allergies, and Medications have been Reviewed.  Past Medical History:  Diagnosis Date   Allergy    Diabetes mellitus    Hypertension    PONV (postoperative nausea and vomiting)    Past Surgical History:  Procedure Laterality Date   ABDOMINAL HYSTERECTOMY     CESAREAN SECTION     COLONOSCOPY  03/01/2012   Procedure: COLONOSCOPY;  Surgeon: Ruby Corporal, MD;  Location: [facility];  Service: Endoscopy;  Laterality: N/A;  950    COLONOSCOPY WITH PROPOFOL  N/A 09/01/2023   Procedure: COLONOSCOPY WITH PROPOFOL ;  Surgeon: Hargis Lias, MD;  Location: [facility];  Service: Endoscopy;  Laterality: N/A;  11:15AM;ASA 1-2   right carpal tunnel release       Current Meds  Medication Sig   albuterol  (VENTOLIN  HFA) 108 (90 Base) MCG/ACT inhaler Inhale 2 puffs into the lungs every 6 (six) hours as needed for wheezing or shortness of breath.   azelastine  (ASTELIN ) 0.1 % nasal spray Place 1 spray into both nostrils 2 (two) times daily. Use in each nostril as directed   benzonatate  (TESSALON ) 200 MG capsule Take 1 capsule (200 mg total) by mouth 2 (two) times daily as needed for cough.   Blood Glucose Monitoring Suppl DEVI 1 each by Does not apply route in the morning, at noon, and at bedtime. May substitute to any manufacturer covered by patient's insurance.   fluticasone  (FLONASE ) 50 MCG/ACT nasal spray Place 2 sprays into both nostrils daily.   glipiZIDE  (GLUCOTROL  XL) 2.5 MG 24 hr tablet TAKE 1 TABLET BY MOUTH EVERY DAY WITH BREAKFAST   latanoprost (XALATAN) 0.005 % ophthalmic solution 1 drop at bedtime.   loratadine  (CLARITIN ) 10 MG tablet TAKE 1 TABLET BY MOUTH EVERY DAY   montelukast  (SINGULAIR ) 10 MG tablet TAKE 1 TABLET BY MOUTH EVERYDAY AT BEDTIME   Multiple Vitamin (MULTIVITAMIN) tablet Take 1 tablet by mouth daily.   PROAIR  HFA 108 (90 Base)  MCG/ACT inhaler Inhale 2 puffs into the lungs every 6 (six) hours as needed for wheezing or shortness of breath.   promethazine -dextromethorphan (PROMETHAZINE -DM) 6.25-15 MG/5ML syrup Take one teaspoon by mouth at bedtime as needed , for excessivwe cough   rosuvastatin  (CRESTOR ) 5 MG tablet TAKE 1 TABLET EVERY MONDAY AND THURSDAY   SitaGLIPtin -MetFORMIN  HCl (JANUMET  XR) 50-1000 MG TB24 TAKE 2 TABLETS EVERY DAY   valsartan -hydrochlorothiazide  (DIOVAN -HCT) 320-25 MG tablet TAKE 1 TABLET BY MOUTH EVERY DAY     Allergies:   Patient has no known allergies.   ROS:   Please see  the history of present illness. All other systems reviewed and are negative.   Labs/Other Tests and Data Reviewed:    Recent Labs: 04/05/2023: Hemoglobin 12.1; Platelets 285 09/05/2023: ALT 22; BUN 8; Creatinine, Ser 0.90; Potassium 3.7; Sodium 143   Recent Lipid Panel Lab Results  Component Value Date/Time   CHOL 168 04/05/2023 08:17 AM   TRIG 121 04/05/2023 08:17 AM   HDL 80 04/05/2023 08:17 AM   CHOLHDL 2.1 04/05/2023 08:17 AM   CHOLHDL 2.5 06/20/2018 08:43 AM   LDLCALC 67 04/05/2023 08:17 AM   LDLCALC 94 06/20/2018 08:43 AM    Wt Readings from Last 3 Encounters:  09/05/23 181 lb 0.6 oz (82.1 kg)  09/04/23 175 lb (79.4 kg)  09/01/23 174 lb (78.9 kg)     Objective:    Vital Signs:  There were no vitals taken for this visit.   VITAL SIGNS:  reviewed GEN:  no acute distress EYES:  sclerae anicteric, EOMI - Extraocular Movements Intact RESPIRATORY:  normal respiratory effort, symmetric expansion NEURO:  alert and oriented x 3, no obvious focal deficit PSYCH:  normal affect  ASSESSMENT & PLAN:    Severe persistent asthma with acute exacerbation Has recent worsening of cough, dyspnea and wheezing Started Medrol  Dosepak Started empiric azithromycin  considering possibility of acute sinusitis as well Albuterol  as needed for dyspnea or wheezing Prescribed DuoNeb for ease of use and better response Needs a maintenance inhaler, Cost is a concerning factor Coricidin or Robitussin as needed for cough   I discussed the assessment and treatment plan with the patient. The patient was provided an opportunity to ask questions, and all were answered. The patient agreed with the plan and demonstrated an understanding of the instructions.   The patient was advised to call back or seek an in-person evaluation if the symptoms worsen or if the condition fails to improve as anticipated.  The above assessment and management plan was discussed with the patient. The patient verbalized  understanding of and has agreed to the management plan.   Medication Adjustments/Labs and Tests Ordered: Current medicines are reviewed at length with the patient today.  Concerns regarding medicines are outlined above.   Tests Ordered: No orders of the defined types were placed in this encounter.   Medication Changes: No orders of the defined types were placed in this encounter.    Note: This dictation was prepared with Dragon dictation along with smaller phrase technology. Similar sounding words can be transcribed inadequately or may not be corrected upon review. Any transcriptional errors that result from this process are unintentional.      Disposition:  Follow up  Signed, Meldon Sport, MD  09/25/2023 12:57 PM     [facility]

## 2023-09-29 DIAGNOSIS — H401131 Primary open-angle glaucoma, bilateral, mild stage: Secondary | ICD-10-CM | POA: Diagnosis not present

## 2023-09-29 DIAGNOSIS — H26492 Other secondary cataract, left eye: Secondary | ICD-10-CM | POA: Diagnosis not present

## 2023-10-02 ENCOUNTER — Other Ambulatory Visit: Payer: Self-pay | Admitting: Family Medicine

## 2023-10-17 ENCOUNTER — Telehealth: Payer: Self-pay

## 2023-10-17 ENCOUNTER — Other Ambulatory Visit: Payer: Self-pay

## 2023-10-17 NOTE — Progress Notes (Signed)
   10/17/2023  Patient ID: Daisy Robbins, female   DOB: [birth year], 63 y.o.   MRN: [id number]  Attempted to contact patient for scheduled appointment for medication management. Unable to LVM. Happy to talk with patient if needed in the future.   Rolando Cliche, PharmD, BCGP Clinical Pharmacist  [phone]

## 2023-10-22 ENCOUNTER — Other Ambulatory Visit: Payer: Self-pay | Admitting: Family Medicine

## 2023-11-06 DIAGNOSIS — H26492 Other secondary cataract, left eye: Secondary | ICD-10-CM | POA: Diagnosis not present

## 2023-11-21 ENCOUNTER — Other Ambulatory Visit: Payer: Self-pay | Admitting: Family Medicine

## 2023-12-05 ENCOUNTER — Encounter: Payer: Self-pay | Admitting: Family Medicine

## 2023-12-05 ENCOUNTER — Ambulatory Visit (INDEPENDENT_AMBULATORY_CARE_PROVIDER_SITE_OTHER): Admitting: Family Medicine

## 2023-12-05 VITALS — BP 119/77 | HR 67 | Resp 16 | Ht 63.0 in | Wt 181.1 lb

## 2023-12-05 DIAGNOSIS — E1169 Type 2 diabetes mellitus with other specified complication: Secondary | ICD-10-CM | POA: Diagnosis not present

## 2023-12-05 DIAGNOSIS — E559 Vitamin D deficiency, unspecified: Secondary | ICD-10-CM | POA: Diagnosis not present

## 2023-12-05 DIAGNOSIS — E1159 Type 2 diabetes mellitus with other circulatory complications: Secondary | ICD-10-CM

## 2023-12-05 DIAGNOSIS — Z0001 Encounter for general adult medical examination with abnormal findings: Secondary | ICD-10-CM | POA: Diagnosis not present

## 2023-12-05 DIAGNOSIS — I10 Essential (primary) hypertension: Secondary | ICD-10-CM | POA: Diagnosis not present

## 2023-12-05 DIAGNOSIS — E785 Hyperlipidemia, unspecified: Secondary | ICD-10-CM | POA: Diagnosis not present

## 2023-12-05 MED ORDER — BUDESONIDE-FORMOTEROL FUMARATE 80-4.5 MCG/ACT IN AERO: 2.0000 | INHALATION_SPRAY | Freq: Two times a day (BID) | RESPIRATORY_TRACT | 11 refills | Status: AC

## 2023-12-05 NOTE — Progress Notes (Signed)
 Daisy Robbins     MRN: [id number]      DOB: [birth year]  Chief Complaint  Patient presents with   Annual Exam    HPI: Patient is in for annual physical exam. No other health concerns are expressed or addressed at the visit. Recent labs,  are reviewed. Immunization is reviewed , and  updated if needed.Declines all  vaccines at this time   PE: BP 119/77   Pulse 67   Resp 16   Ht 5' 3 (1.6 m)   Wt 181 lb 1.3 oz (82.1 kg)   SpO2 97%   BMI 32.08 kg/m   Pleasant  female, alert and oriented x 3, in no cardio-pulmonary distress. Afebrile. HEENT No facial trauma or asymetry. Sinuses non tender.  Extra occullar muscles intact.. External ears normal, . Neck: supple, no adenopathy,JVD or thyromegaly.No bruits.  Chest: Clear to ascultation bilaterally.No crackles or wheezes. Non tender to palpation    Cardiovascular system; Heart sounds normal,  S1 and  S2 ,no S3.  No murmur, or thrill. Apical beat not displaced Peripheral pulses normal.  Abdomen: Soft, non tender, no organomegaly or masses. No bruits. Bowel sounds normal. No guarding, tenderness or rebound.    Musculoskeletal exam: Full ROM of spine, hips , shoulders and knees. No deformity ,swelling or crepitus noted. No muscle wasting or atrophy.   Neurologic: Cranial nerves 2 to 12 intact. Power, tone ,sensation and reflexes normal throughout. No disturbance in gait. No tremor.  Skin: Intact, no ulceration, erythema , scaling or rash noted. Pigmentation normal throughout  Psych; Normal mood and affect. Judgement and concentration normal   Assessment & Plan:  Encounter for Medicare annual examination with abnormal findings Annual exam as documented. Counseling done  re healthy lifestyle involving commitment to 150 minutes exercise per week, heart healthy diet, and attaining healthy weight.The importance of adequate sleep also discussed. Regular seat belt use and home safety, is also  discussed. Changes in health habits are decided on by the patient with goals and time frames  set for achieving them. Immunization and cancer screening needs are specifically addressed at this visit.   Type 2 diabetes mellitus with vascular disease (HCC) Diabetes associated with hypertension and hyperlipidemia  Ms. Shuler is reminded of the importance of commitment to daily physical activity for 30 minutes or more, as able and the need to limit carbohydrate intake to 30 to 60 grams per meal to help with blood sugar control.   The need to take medication as prescribed, test blood sugar as directed, and to call between visits if there is a concern that blood sugar is uncontrolled is also discussed.   Ms. Hundertmark is reminded of the importance of daily foot exam, annual eye examination, and good blood sugar, blood pressure and cholesterol control.     Latest Ref Rng & Units 09/05/2023   10:21 AM 04/05/2023    8:17 AM 09/09/2022   11:11 AM 06/10/2022    9:30 AM 11/25/2021   12:06 PM  Diabetic Labs  HbA1c 4.8 - 5.6 % 7.1  7.4  7.1  8.1  6.8   Micro/Creat Ratio 0 - 29 mg/g creat <4    <5    Chol 100 - 199 mg/dL  831   817    HDL >60 mg/dL  80   89    Calc LDL 0 - 99 mg/dL  67   79    Triglycerides 0 - 149 mg/dL  878  75    Creatinine 0.57 - 1.00 mg/dL 9.09  9.09  9.09  9.12        12/05/2023    9:00 AM 09/05/2023    9:13 AM 09/04/2023   10:50 AM 09/01/2023    9:43 AM 09/01/2023    8:21 AM 06/06/2023   10:23 AM 06/06/2023   10:20 AM  BP/Weight  Systolic BP 119 121 128 97 128 125 142  Diastolic BP 77 78 84 43 84 81 80  Wt. (Lbs) 181.08 181.04 175  174  181.3  BMI 32.08 kg/m2 32.07 kg/m2 31 kg/m2  30.82 kg/m2  32.12 kg/m2      Latest Ref Rng & Units 05/29/2023   12:00 AM 06/10/2022    8:20 AM  Foot/eye exam completion dates  Eye Exam No Retinopathy No Retinopathy       Foot Form Completion   Done     This result is from an external source.      Updated lab needed at/ before next  visit.

## 2023-12-05 NOTE — Assessment & Plan Note (Signed)
 Diabetes associated with hypertension and hyperlipidemia  Daisy Robbins is reminded of the importance of commitment to daily physical activity for 30 minutes or more, as able and the need to limit carbohydrate intake to 30 to 60 grams per meal to help with blood sugar control.   The need to take medication as prescribed, test blood sugar as directed, and to call between visits if there is a concern that blood sugar is uncontrolled is also discussed.   Daisy Robbins is reminded of the importance of daily foot exam, annual eye examination, and good blood sugar, blood pressure and cholesterol control.     Latest Ref Rng & Units 09/05/2023   10:21 AM 04/05/2023    8:17 AM 09/09/2022   11:11 AM 06/10/2022    9:30 AM 11/25/2021   12:06 PM  Diabetic Labs  HbA1c 4.8 - 5.6 % 7.1  7.4  7.1  8.1  6.8   Micro/Creat Ratio 0 - 29 mg/g creat <4    <5    Chol 100 - 199 mg/dL  831   817    HDL >60 mg/dL  80   89    Calc LDL 0 - 99 mg/dL  67   79    Triglycerides 0 - 149 mg/dL  878   75    Creatinine 0.57 - 1.00 mg/dL 9.09  9.09  9.09  9.12        12/05/2023    9:00 AM 09/05/2023    9:13 AM 09/04/2023   10:50 AM 09/01/2023    9:43 AM 09/01/2023    8:21 AM 06/06/2023   10:23 AM 06/06/2023   10:20 AM  BP/Weight  Systolic BP 119 121 128 97 128 125 142  Diastolic BP 77 78 84 43 84 81 80  Wt. (Lbs) 181.08 181.04 175  174  181.3  BMI 32.08 kg/m2 32.07 kg/m2 31 kg/m2  30.82 kg/m2  32.12 kg/m2      Latest Ref Rng & Units 05/29/2023   12:00 AM 06/10/2022    8:20 AM  Foot/eye exam completion dates  Eye Exam No Retinopathy No Retinopathy       Foot Form Completion   Done     This result is from an external source.      Updated lab needed at/ before next visit.

## 2023-12-05 NOTE — Assessment & Plan Note (Signed)

## 2023-12-05 NOTE — Patient Instructions (Addendum)
 F/U in 13 weeks , call if you need me sooner  Labs today,HBA1C, lipid, cmp and EGFr, HBA1C, TSH. And vit D  Need vaccines as  discussed  You are referred to dietitian again  I prescribed symbicort  , will wait to see what is your preferred maintenance inhaler  Pls send a  My chart message re coverage if a problem  It is important that you exercise regularly at least 30 minutes 5 times a week. If you develop chest pain, have severe difficulty breathing, or feel very tired, stop exercising immediately and seek medical attention   Thanks for choosing [facility], we consider it a privelige to serve you.

## 2023-12-06 ENCOUNTER — Other Ambulatory Visit: Payer: Self-pay

## 2023-12-06 ENCOUNTER — Ambulatory Visit: Payer: Self-pay | Admitting: Family Medicine

## 2023-12-06 DIAGNOSIS — I10 Essential (primary) hypertension: Secondary | ICD-10-CM

## 2023-12-06 DIAGNOSIS — E1159 Type 2 diabetes mellitus with other circulatory complications: Secondary | ICD-10-CM

## 2023-12-06 LAB — CMP14+EGFR
ALT: 22 IU/L (ref 0–32)
AST: 24 IU/L (ref 0–40)
Albumin: 4.3 g/dL (ref 3.9–4.9)
Alkaline Phosphatase: 113 IU/L (ref 44–121)
BUN/Creatinine Ratio: 13 (ref 12–28)
BUN: 12 mg/dL (ref 8–27)
Bilirubin Total: 0.3 mg/dL (ref 0.0–1.2)
CO2: 27 mmol/L (ref 20–29)
Calcium: 10.7 mg/dL — ABNORMAL HIGH (ref 8.7–10.3)
Chloride: 98 mmol/L (ref 96–106)
Creatinine, Ser: 0.94 mg/dL (ref 0.57–1.00)
Globulin, Total: 3 g/dL (ref 1.5–4.5)
Glucose: 119 mg/dL — ABNORMAL HIGH (ref 70–99)
Potassium: 3.8 mmol/L (ref 3.5–5.2)
Sodium: 142 mmol/L (ref 134–144)
Total Protein: 7.3 g/dL (ref 6.0–8.5)
eGFR: 68 mL/min/{1.73_m2} (ref >59)

## 2023-12-06 LAB — LIPID PANEL
Chol/HDL Ratio: 1.9 ratio (ref 0.0–4.4)
Cholesterol, Total: 150 mg/dL (ref 100–199)
HDL: 77 mg/dL (ref >39)
LDL Chol Calc (NIH): 59 mg/dL (ref 0–99)
Triglycerides: 69 mg/dL (ref 0–149)
VLDL Cholesterol Cal: 14 mg/dL (ref 5–40)

## 2023-12-06 LAB — VITAMIN D 25 HYDROXY (VIT D DEFICIENCY, FRACTURES): Vit D, 25-Hydroxy: 33.8 ng/mL (ref 30.0–100.0)

## 2023-12-06 LAB — TSH: TSH: 1.05 u[IU]/mL (ref 0.450–4.500)

## 2023-12-06 LAB — HEMOGLOBIN A1C
Est. average glucose Bld gHb Est-mCnc: 163 mg/dL
Hgb A1c MFr Bld: 7.3 % — ABNORMAL HIGH (ref 4.8–5.6)

## 2023-12-06 MED ORDER — GLIPIZIDE ER 5 MG PO TB24: 5.0000 mg | ORAL_TABLET | Freq: Every day | ORAL | 5 refills | Status: DC

## 2024-01-02 NOTE — Telephone Encounter (Signed)
 Shredded provider page, will need updated signature.

## 2024-01-10 ENCOUNTER — Other Ambulatory Visit: Payer: Self-pay | Admitting: Family Medicine

## 2024-01-30 DIAGNOSIS — H2513 Age-related nuclear cataract, bilateral: Secondary | ICD-10-CM | POA: Diagnosis not present

## 2024-01-30 DIAGNOSIS — H401131 Primary open-angle glaucoma, bilateral, mild stage: Secondary | ICD-10-CM | POA: Diagnosis not present

## 2024-03-05 ENCOUNTER — Ambulatory Visit: Admitting: Family Medicine

## 2024-03-13 ENCOUNTER — Ambulatory Visit: Admitting: Nurse Practitioner

## 2024-04-22 ENCOUNTER — Telehealth: Payer: Self-pay

## 2024-04-22 NOTE — Telephone Encounter (Signed)
 Copied from CRM #[id number]. Topic: Appointments - Appointment Cancel/Reschedule >> Apr 22, 2024 12:15 PM Ivette P wrote: Patient/patient representative is calling to cancel or reschedule an appointment. Refer to attachments for appointment information.     Pt wantign to reschedule appt on 11/20 - next avilabl 01/02 per notes states wants to see before January and cannot wait until january pt wants to reschedule. Pls call pt to follow up     If does not answer please leave voicemail

## 2024-04-25 ENCOUNTER — Ambulatory Visit: Admitting: Family Medicine

## 2024-05-15 ENCOUNTER — Other Ambulatory Visit: Payer: Self-pay | Admitting: Family Medicine

## 2024-06-07 ENCOUNTER — Ambulatory Visit (INDEPENDENT_AMBULATORY_CARE_PROVIDER_SITE_OTHER): Admitting: Family Medicine

## 2024-06-07 ENCOUNTER — Other Ambulatory Visit (HOSPITAL_COMMUNITY): Payer: Self-pay

## 2024-06-07 ENCOUNTER — Other Ambulatory Visit (HOSPITAL_COMMUNITY): Payer: Self-pay | Admitting: Family Medicine

## 2024-06-07 ENCOUNTER — Encounter: Payer: Self-pay | Admitting: Family Medicine

## 2024-06-07 VITALS — BP 138/70 | HR 75 | Resp 16 | Ht 63.0 in | Wt 175.0 lb

## 2024-06-07 DIAGNOSIS — E1169 Type 2 diabetes mellitus with other specified complication: Secondary | ICD-10-CM

## 2024-06-07 DIAGNOSIS — Z6831 Body mass index (BMI) 31.0-31.9, adult: Secondary | ICD-10-CM

## 2024-06-07 DIAGNOSIS — E785 Hyperlipidemia, unspecified: Secondary | ICD-10-CM | POA: Diagnosis not present

## 2024-06-07 DIAGNOSIS — E66811 Obesity, class 1: Secondary | ICD-10-CM

## 2024-06-07 DIAGNOSIS — Z23 Encounter for immunization: Secondary | ICD-10-CM

## 2024-06-07 DIAGNOSIS — I10 Essential (primary) hypertension: Secondary | ICD-10-CM | POA: Diagnosis not present

## 2024-06-07 DIAGNOSIS — R748 Abnormal levels of other serum enzymes: Secondary | ICD-10-CM

## 2024-06-07 DIAGNOSIS — Z1231 Encounter for screening mammogram for malignant neoplasm of breast: Secondary | ICD-10-CM

## 2024-06-07 DIAGNOSIS — E1159 Type 2 diabetes mellitus with other circulatory complications: Secondary | ICD-10-CM

## 2024-06-07 MED ORDER — TIRZEPATIDE 2.5 MG/0.5ML ~~LOC~~ SOAJ: 2.5000 mg | SUBCUTANEOUS | 0 refills | Status: DC

## 2024-06-07 NOTE — Patient Instructions (Addendum)
 Annual exam in 13 weeks  Please schedule mammogram at checkout  Flu vaccine today  Nurse visit in 4 weeks for Pneumonia vaccine  Please get covid  vaccine in next 1 to 2 weeks  Fasting  lipid , cmp and EGFr, HBA1C today\\   New for diabetes is Mounjaro , start at 2.5 mg q weekly for 1 month,then increase to 5 mg weeklly, pls send message to let me know if you have the med  It is important that you exercise regularly at least 30 minutes 5 times a week. If you develop chest pain, have severe difficulty breathing, or feel very tired, stop exercising immediately and seek medical attention   Thanks for choosing [facility], we consider it a privelige to serve you.

## 2024-06-07 NOTE — Progress Notes (Signed)
 "  Daisy Robbins     MRN: [id number]      DOB: 14-Jun-1960  Chief Complaint  Patient presents with   Medical Management of Chronic Issues    13 week follow up     HPI Daisy Robbins is here for follow up and re-evaluation of chronic medical conditions, medication management and review of any available recent lab and radiology data.  Preventive health is updated, specifically  Cancer screening and Immunization.   Questions or concerns regarding consultations or procedures which the PT has had in the interim are  addressed. The PT denies any adverse reactions to current medications since the last visit.  There are no new concerns.  There are no specific complaints   ROS Denies recent fever or chills. Denies sinus pressure, nasal congestion, ear pain or sore throat. Denies chest congestion, productive cough or wheezing. Denies chest pains, palpitations and leg swelling Denies abdominal pain, nausea, vomiting,diarrhea or constipation.   Denies dysuria, frequency, hesitancy or incontinence. Denies joint pain, swelling and limitation in mobility. Denies headaches, seizures, numbness, or tingling. Denies depression, anxiety or insomnia. Denies skin break down or rash.   PE  BP 138/70   Pulse 75   Resp 16   Ht 5' 3 (1.6 m)   Wt 175 lb 0.6 oz (79.4 kg)   SpO2 98%   BMI 31.01 kg/m   Patient alert and oriented and in no cardiopulmonary distress.  HEENT: No facial asymmetry, EOMI,     Neck supple .  Chest: Clear to auscultation bilaterally.  CVS: S1, S2 no murmurs, no S3.Regular rate.  ABD: Soft non tender.   Ext: No edema  MS: Adequate ROM spine, shoulders, hips and knees.  Skin: Intact, no ulcerations or rash noted.  Psych: Good eye contact, normal affect. Memory intact not anxious or depressed appearing.  CNS: CN 2-12 intact, power,  normal throughout.no focal deficits noted.   Assessment & Plan  Essential hypertension, benign Controlled, no change in medication DASH  diet and commitment to daily physical activity for a minimum of 30 minutes discussed and encouraged, as a part of hypertension management. The importance of attaining a healthy weight is also discussed.     06/07/2024    9:04 AM 06/07/2024    8:34 AM 12/05/2023    9:00 AM 09/05/2023    9:13 AM 09/04/2023   10:50 AM 09/01/2023    9:43 AM 09/01/2023    8:21 AM  BP/Weight  Systolic BP 138 145 119 121 128 97 128  Diastolic BP 70 79 77 78 84 43 84  Wt. (Lbs)  175.04 181.08 181.04 175  174  BMI  31.01 kg/m2 32.08 kg/m2 32.07 kg/m2 31 kg/m2  30.82 kg/m2       Hyperlipidemia LDL goal <100 Hyperlipidemia:Low fat diet discussed and encouraged.   Lipid Panel  Lab Results  Component Value Date   CHOL 158 06/07/2024   HDL 83 06/07/2024   LDLCALC 60 06/07/2024   TRIG 79 06/07/2024   CHOLHDL 1.9 06/07/2024     .cpon1   Elevated alkaline phosphatase level Repeat test x 2  normal, also had GI eval  Immunization due After obtaining informed consent, the influenza  vaccine is  administered , with no adverse effect noted at the time of administration.   Obesity (BMI 30.0-34.9)  Patient re-educated about  the importance of commitment to a  minimum of 150 minutes of exercise per week as able.  The importance of healthy food choices with  portion control discussed, as well as eating regularly and within a 12 hour window most days. The need to choose clean , green food 50 to 75% of the time is discussed, as well as to make water  the primary drink and set a goal of 64 ounces water  daily.       06/07/2024    8:34 AM 12/05/2023    9:00 AM 09/05/2023    9:13 AM  Weight /BMI  Weight 175 lb 0.6 oz 181 lb 1.3 oz 181 lb 0.6 oz  Height 5' 3 (1.6 m) 5' 3 (1.6 m) 5' 3 (1.6 m)  BMI 31.01 kg/m2 32.08 kg/m2 32.07 kg/m2    Improving which is great  Type 2 diabetes mellitus with other specified complication (HCC) Diabetes associated with hypertension, hyperlipidemia, and obesity  Daisy Robbins is  reminded of the importance of commitment to daily physical activity for 30 minutes or more, as able and the need to limit carbohydrate intake to 30 to 60 grams per meal to help with blood sugar control.   The need to take medication as prescribed, test blood sugar as directed, and to call between visits if there is a concern that blood sugar is uncontrolled is also discussed.   Daisy Robbins is reminded of the importance of daily foot exam, annual eye examination, and good blood sugar, blood pressure and cholesterol control.     Latest Ref Rng & Units 06/07/2024    9:20 AM 12/05/2023    9:47 AM 09/05/2023   10:21 AM 04/05/2023    8:17 AM 09/09/2022   11:11 AM  Diabetic Labs  HbA1c 4.8 - 5.6 % 7.0  7.3  7.1  7.4  7.1   Micro/Creat Ratio 0 - 29 mg/g creat   <4     Chol 100 - 199 mg/dL 841  849   831    HDL >60 mg/dL 83  77   80    Calc LDL 0 - 99 mg/dL 60  59   67    Triglycerides 0 - 149 mg/dL 79  69   878    Creatinine 0.57 - 1.00 mg/dL 9.10  9.05  9.09  9.09  0.90       06/07/2024    9:04 AM 06/07/2024    8:34 AM 12/05/2023    9:00 AM 09/05/2023    9:13 AM 09/04/2023   10:50 AM 09/01/2023    9:43 AM 09/01/2023    8:21 AM  BP/Weight  Systolic BP 138 145 119 121 128 97 128  Diastolic BP 70 79 77 78 84 43 84  Wt. (Lbs)  175.04 181.08 181.04 175  174  BMI  31.01 kg/m2 32.08 kg/m2 32.07 kg/m2 31 kg/m2  30.82 kg/m2      Latest Ref Rng & Units 05/29/2023   12:00 AM 06/10/2022    8:20 AM  Foot/eye exam completion dates  Eye Exam No Retinopathy No Retinopathy       Foot Form Completion   Done     This result is from an external source.      Will send in rx for mounjaro  in place of gliupizide     "

## 2024-06-08 ENCOUNTER — Ambulatory Visit: Payer: Self-pay | Admitting: Family Medicine

## 2024-06-08 ENCOUNTER — Encounter: Payer: Self-pay | Admitting: Family Medicine

## 2024-06-08 LAB — LIPID PANEL
Chol/HDL Ratio: 1.9 ratio (ref 0.0–4.4)
Cholesterol, Total: 158 mg/dL (ref 100–199)
HDL: 83 mg/dL
LDL Chol Calc (NIH): 60 mg/dL (ref 0–99)
Triglycerides: 79 mg/dL (ref 0–149)
VLDL Cholesterol Cal: 15 mg/dL (ref 5–40)

## 2024-06-08 LAB — CMP14+EGFR
ALT: 19 IU/L (ref 0–32)
AST: 21 IU/L (ref 0–40)
Albumin: 4.4 g/dL (ref 3.9–4.9)
Alkaline Phosphatase: 108 IU/L (ref 49–135)
BUN/Creatinine Ratio: 11 — ABNORMAL LOW (ref 12–28)
BUN: 10 mg/dL (ref 8–27)
Bilirubin Total: 0.4 mg/dL (ref 0.0–1.2)
CO2: 30 mmol/L — ABNORMAL HIGH (ref 20–29)
Calcium: 10.3 mg/dL (ref 8.7–10.3)
Chloride: 98 mmol/L (ref 96–106)
Creatinine, Ser: 0.89 mg/dL (ref 0.57–1.00)
Globulin, Total: 2.7 g/dL (ref 1.5–4.5)
Glucose: 136 mg/dL — ABNORMAL HIGH (ref 70–99)
Potassium: 3.5 mmol/L (ref 3.5–5.2)
Sodium: 143 mmol/L (ref 134–144)
Total Protein: 7.1 g/dL (ref 6.0–8.5)
eGFR: 72 mL/min/1.73

## 2024-06-08 LAB — HEMOGLOBIN A1C
Est. average glucose Bld gHb Est-mCnc: 154 mg/dL
Hgb A1c MFr Bld: 7 % — ABNORMAL HIGH (ref 4.8–5.6)

## 2024-06-09 ENCOUNTER — Encounter: Payer: Self-pay | Admitting: Family Medicine

## 2024-06-09 DIAGNOSIS — Z23 Encounter for immunization: Secondary | ICD-10-CM | POA: Insufficient documentation

## 2024-06-09 NOTE — Assessment & Plan Note (Addendum)
 Diabetes associated with hypertension, hyperlipidemia, and obesity  Daisy Robbins is reminded of the importance of commitment to daily physical activity for 30 minutes or more, as able and the need to limit carbohydrate intake to 30 to 60 grams per meal to help with blood sugar control.   The need to take medication as prescribed, test blood sugar as directed, and to call between visits if there is a concern that blood sugar is uncontrolled is also discussed.   Daisy Robbins is reminded of the importance of daily foot exam, annual eye examination, and good blood sugar, blood pressure and cholesterol control.     Latest Ref Rng & Units 06/07/2024    9:20 AM 12/05/2023    9:47 AM 09/05/2023   10:21 AM 04/05/2023    8:17 AM 09/09/2022   11:11 AM  Diabetic Labs  HbA1c 4.8 - 5.6 % 7.0  7.3  7.1  7.4  7.1   Micro/Creat Ratio 0 - 29 mg/g creat   <4     Chol 100 - 199 mg/dL 841  849   831    HDL >60 mg/dL 83  77   80    Calc LDL 0 - 99 mg/dL 60  59   67    Triglycerides 0 - 149 mg/dL 79  69   878    Creatinine 0.57 - 1.00 mg/dL 9.10  9.05  9.09  9.09  0.90       06/07/2024    9:04 AM 06/07/2024    8:34 AM 12/05/2023    9:00 AM 09/05/2023    9:13 AM 09/04/2023   10:50 AM 09/01/2023    9:43 AM 09/01/2023    8:21 AM  BP/Weight  Systolic BP 138 145 119 121 128 97 128  Diastolic BP 70 79 77 78 84 43 84  Wt. (Lbs)  175.04 181.08 181.04 175  174  BMI  31.01 kg/m2 32.08 kg/m2 32.07 kg/m2 31 kg/m2  30.82 kg/m2      Latest Ref Rng & Units 05/29/2023   12:00 AM 06/10/2022    8:20 AM  Foot/eye exam completion dates  Eye Exam No Retinopathy No Retinopathy       Foot Form Completion   Done     This result is from an external source.      Will send in rx for mounjaro  in place of gliupizide

## 2024-06-09 NOTE — Assessment & Plan Note (Signed)
" °  Patient re-educated about  the importance of commitment to a  minimum of 150 minutes of exercise per week as able.  The importance of healthy food choices with portion control discussed, as well as eating regularly and within a 12 hour window most days. The need to choose clean , green food 50 to 75% of the time is discussed, as well as to make water  the primary drink and set a goal of 64 ounces water  daily.       06/07/2024    8:34 AM 12/05/2023    9:00 AM 09/05/2023    9:13 AM  Weight /BMI  Weight 175 lb 0.6 oz 181 lb 1.3 oz 181 lb 0.6 oz  Height 5' 3 (1.6 m) 5' 3 (1.6 m) 5' 3 (1.6 m)  BMI 31.01 kg/m2 32.08 kg/m2 32.07 kg/m2    Improving which is great "

## 2024-06-09 NOTE — Assessment & Plan Note (Signed)
 Controlled, no change in medication DASH diet and commitment to daily physical activity for a minimum of 30 minutes discussed and encouraged, as a part of hypertension management. The importance of attaining a healthy weight is also discussed.     06/07/2024    9:04 AM 06/07/2024    8:34 AM 12/05/2023    9:00 AM 09/05/2023    9:13 AM 09/04/2023   10:50 AM 09/01/2023    9:43 AM 09/01/2023    8:21 AM  BP/Weight  Systolic BP 138 145 119 121 128 97 128  Diastolic BP 70 79 77 78 84 43 84  Wt. (Lbs)  175.04 181.08 181.04 175  174  BMI  31.01 kg/m2 32.08 kg/m2 32.07 kg/m2 31 kg/m2  30.82 kg/m2

## 2024-06-09 NOTE — Assessment & Plan Note (Signed)
 Repeat test x 2  normal, also had GI eval

## 2024-06-09 NOTE — Assessment & Plan Note (Signed)
 Hyperlipidemia:Low fat diet discussed and encouraged.   Lipid Panel  Lab Results  Component Value Date   CHOL 158 06/07/2024   HDL 83 06/07/2024   LDLCALC 60 06/07/2024   TRIG 79 06/07/2024   CHOLHDL 1.9 06/07/2024     .cpon1

## 2024-06-09 NOTE — Assessment & Plan Note (Signed)
 After obtaining informed consent, the influenza vaccine is  administered , with no adverse effect noted at the time of administration.

## 2024-06-10 ENCOUNTER — Telehealth: Payer: Self-pay

## 2024-06-10 ENCOUNTER — Telehealth: Payer: Self-pay | Admitting: Pharmacy Technician

## 2024-06-10 ENCOUNTER — Other Ambulatory Visit (HOSPITAL_COMMUNITY): Payer: Self-pay

## 2024-06-10 NOTE — Telephone Encounter (Signed)
 Copied from CRM #[id number]. Topic: Clinical - Lab/Test Results >> Jun 10, 2024  9:36 AM Tiffany B wrote: Reason for CRM: Patient returned call regarding her lab results. Lab results relayed and patient voiced understanding with no further questions.

## 2024-06-10 NOTE — Telephone Encounter (Signed)
 Pharmacy Patient Advocate Encounter  Received notification from [employer] that Prior Authorization for Mounjaro  2.5mg  has been APPROVED from 06/06/24 to 06/05/25. Ran test claim, Copay is $497. This test claim was processed through [facility]- copay amounts may vary at other pharmacies due to pharmacy/plan contracts, or as the patient moves through the different stages of their insurance plan.   PA #/Case ID/Reference #: [id number]

## 2024-06-10 NOTE — Telephone Encounter (Signed)
 Noted

## 2024-06-10 NOTE — Telephone Encounter (Signed)
 Clinical questions have been answered and PA submitted. PA currently Pending. KEY BRECRTCX

## 2024-06-10 NOTE — Telephone Encounter (Signed)
 Pharmacy Patient Advocate Encounter   Received notification from Onbase/cmm key that prior authorization for Mounjaro  2.5mg  is required/requested.   Insurance verification completed.   The patient is insured through [employer].   Per test claim: PA required; PA started via CoverMyMeds. KEY BRECRTCX . Waiting for clinical questions to populate.

## 2024-06-19 ENCOUNTER — Other Ambulatory Visit: Payer: Self-pay | Admitting: Family Medicine

## 2024-06-19 ENCOUNTER — Other Ambulatory Visit (HOSPITAL_COMMUNITY): Payer: Self-pay

## 2024-06-19 ENCOUNTER — Other Ambulatory Visit: Payer: Self-pay

## 2024-06-19 ENCOUNTER — Telehealth: Payer: Self-pay | Admitting: Family Medicine

## 2024-06-19 MED ORDER — GLIPIZIDE ER 2.5 MG PO TB24: 2.5000 mg | ORAL_TABLET | Freq: Every day | ORAL | 1 refills | Status: AC

## 2024-06-19 NOTE — Telephone Encounter (Signed)
 Copied from CRM [id number]. Topic: Clinical - Medication Question >> Jun 19, 2024 10:10 AM [name] wrote: Reason for CRM: patient called to request that her script for tirzepatide  (MOUNJARO ) 2.5 MG/0.5ML Pen be sent to: [facility] - [place], [place] - [address] Phone: [phone] Fax: [phone]

## 2024-06-19 NOTE — Telephone Encounter (Signed)
 Sent!

## 2024-06-19 NOTE — Telephone Encounter (Unsigned)
 Copied from CRM [id number]. Topic: Clinical - Medication Refill >> Jun 19, 2024 10:15 AM [name] wrote: Patient requesting 30 day supply until she recv the Mounjaro  in the mail  Medication: glipiZIDE  (GLUCOTROL  XL) 2.5 MG 24 hr tablet  Has the patient contacted their pharmacy? Yes  This is the patient's preferred pharmacy:   [id number] - [place], [place] - [address] [facility] [address] [place] [address] Phone: [phone] Fax: [phone]  Is this the correct pharmacy for this prescription? Yes  Has the prescription been filled recently? Yes  Is the patient out of the medication? Yes  Has the patient been seen for an appointment in the last year OR does the patient have an upcoming appointment? Yes  Can we respond through MyChart? Yes  Agent: Please be advised that Rx refills may take up to 3 business days. We ask that you follow-up with your pharmacy.

## 2024-06-24 ENCOUNTER — Telehealth: Payer: Self-pay | Admitting: Family Medicine

## 2024-06-24 NOTE — Telephone Encounter (Signed)
 Patient called in about her mounjaro  , she states she still has not received it and would like a call back. Please advise

## 2024-06-24 NOTE — Telephone Encounter (Signed)
 Mounjaro  was approved , bt copay ids $497 may change over time , may be different at other pharmacies , this is response from [facility] pharmacy, does she want to pursue the mounjaro  at that price or is she just going to stay with glipizide ? Pals let me know her response

## 2024-06-24 NOTE — Telephone Encounter (Signed)
 Noted, thanks!

## 2024-06-27 ENCOUNTER — Telehealth: Payer: Self-pay

## 2024-06-27 ENCOUNTER — Other Ambulatory Visit: Payer: Self-pay

## 2024-06-27 MED ORDER — TIRZEPATIDE 2.5 MG/0.5ML ~~LOC~~ SOAJ: 2.5000 mg | SUBCUTANEOUS | 0 refills | Status: AC

## 2024-06-27 NOTE — Telephone Encounter (Signed)
 Copied from CRM #[id number]. Topic: Clinical - Prescription Issue >> Jun 27, 2024  1:13 PM Jasmin G wrote: Reason for CRM: Pt requested a call back at [phone] to clarify recent prescription issues regarding tirzepatide  (MOUNJARO ) 2.5 MG/0.5ML Pen prescription.

## 2024-07-01 ENCOUNTER — Other Ambulatory Visit (HOSPITAL_COMMUNITY): Payer: Self-pay

## 2024-09-04 ENCOUNTER — Ambulatory Visit

## 2024-09-23 ENCOUNTER — Ambulatory Visit (HOSPITAL_COMMUNITY)

## 2024-10-09 ENCOUNTER — Encounter: Payer: Self-pay | Admitting: Family Medicine
